# Patient Record
Sex: Female | Born: 1965 | Race: Black or African American | Hispanic: No | Marital: Single | Smoking: Never smoker
Health system: Southern US, Community
[De-identification: ages and names within clinical notes are randomized; demographics above are authoritative.]

## PROBLEM LIST (undated history)

## (undated) DIAGNOSIS — D649 Anemia, unspecified: Secondary | ICD-10-CM

## (undated) DIAGNOSIS — L509 Urticaria, unspecified: Secondary | ICD-10-CM

## (undated) DIAGNOSIS — K219 Gastro-esophageal reflux disease without esophagitis: Secondary | ICD-10-CM

## (undated) DIAGNOSIS — E162 Hypoglycemia, unspecified: Secondary | ICD-10-CM

## (undated) DIAGNOSIS — I209 Angina pectoris, unspecified: Secondary | ICD-10-CM

## (undated) DIAGNOSIS — M722 Plantar fascial fibromatosis: Secondary | ICD-10-CM

## (undated) DIAGNOSIS — Z9109 Other allergy status, other than to drugs and biological substances: Secondary | ICD-10-CM

## (undated) DIAGNOSIS — T783XXA Angioneurotic edema, initial encounter: Secondary | ICD-10-CM

## (undated) DIAGNOSIS — E7800 Pure hypercholesterolemia, unspecified: Secondary | ICD-10-CM

## (undated) DIAGNOSIS — J329 Chronic sinusitis, unspecified: Secondary | ICD-10-CM

## (undated) DIAGNOSIS — L309 Dermatitis, unspecified: Secondary | ICD-10-CM

## (undated) DIAGNOSIS — J309 Allergic rhinitis, unspecified: Secondary | ICD-10-CM

## (undated) DIAGNOSIS — R011 Cardiac murmur, unspecified: Secondary | ICD-10-CM

## (undated) DIAGNOSIS — I10 Essential (primary) hypertension: Secondary | ICD-10-CM

## (undated) HISTORY — DX: Allergic rhinitis, unspecified: J30.9

## (undated) HISTORY — DX: Dermatitis, unspecified: L30.9

## (undated) HISTORY — PX: CHOLECYSTECTOMY: SHX55

## (undated) HISTORY — DX: Angioneurotic edema, initial encounter: T78.3XXA

## (undated) HISTORY — DX: Gastro-esophageal reflux disease without esophagitis: K21.9

## (undated) HISTORY — PX: FOOT SURGERY: SHX648

## (undated) HISTORY — DX: Hypoglycemia, unspecified: E16.2

## (undated) HISTORY — DX: Urticaria, unspecified: L50.9

---

## 1999-04-15 ENCOUNTER — Emergency Department (HOSPITAL_COMMUNITY): Admission: EM | Admit: 1999-04-15 | Discharge: 1999-04-15 | Payer: Self-pay | Admitting: Emergency Medicine

## 1999-09-22 ENCOUNTER — Encounter: Admission: RE | Admit: 1999-09-22 | Discharge: 1999-09-22 | Payer: Self-pay | Admitting: Urology

## 1999-09-22 ENCOUNTER — Encounter: Payer: Self-pay | Admitting: Urology

## 2000-11-03 ENCOUNTER — Emergency Department (HOSPITAL_COMMUNITY): Admission: EM | Admit: 2000-11-03 | Discharge: 2000-11-03 | Payer: Self-pay | Admitting: Emergency Medicine

## 2000-12-03 ENCOUNTER — Other Ambulatory Visit: Admission: RE | Admit: 2000-12-03 | Discharge: 2000-12-03 | Payer: Self-pay | Admitting: *Deleted

## 2003-04-09 ENCOUNTER — Ambulatory Visit (HOSPITAL_COMMUNITY): Admission: RE | Admit: 2003-04-09 | Discharge: 2003-04-09 | Payer: Self-pay | Admitting: Family Medicine

## 2003-08-26 ENCOUNTER — Emergency Department (HOSPITAL_COMMUNITY): Admission: EM | Admit: 2003-08-26 | Discharge: 2003-08-27 | Payer: Self-pay | Admitting: Emergency Medicine

## 2003-12-30 ENCOUNTER — Ambulatory Visit (HOSPITAL_COMMUNITY): Admission: RE | Admit: 2003-12-30 | Discharge: 2003-12-30 | Payer: Self-pay | Admitting: *Deleted

## 2005-04-19 ENCOUNTER — Other Ambulatory Visit: Admission: RE | Admit: 2005-04-19 | Discharge: 2005-04-19 | Payer: Self-pay | Admitting: Family Medicine

## 2005-10-23 ENCOUNTER — Ambulatory Visit: Payer: Self-pay

## 2006-02-28 ENCOUNTER — Ambulatory Visit: Payer: Self-pay | Admitting: Obstetrics and Gynecology

## 2006-08-23 ENCOUNTER — Other Ambulatory Visit: Admission: RE | Admit: 2006-08-23 | Discharge: 2006-08-23 | Payer: Self-pay | Admitting: Family Medicine

## 2007-08-25 ENCOUNTER — Other Ambulatory Visit: Admission: RE | Admit: 2007-08-25 | Discharge: 2007-08-25 | Payer: Self-pay | Admitting: Family Medicine

## 2008-02-22 ENCOUNTER — Emergency Department (HOSPITAL_COMMUNITY): Admission: EM | Admit: 2008-02-22 | Discharge: 2008-02-22 | Payer: Self-pay | Admitting: Emergency Medicine

## 2008-09-07 ENCOUNTER — Other Ambulatory Visit: Admission: RE | Admit: 2008-09-07 | Discharge: 2008-09-07 | Payer: Self-pay | Admitting: Family Medicine

## 2009-07-19 ENCOUNTER — Ambulatory Visit: Payer: Self-pay | Admitting: General Practice

## 2009-08-04 ENCOUNTER — Ambulatory Visit (HOSPITAL_COMMUNITY): Admission: RE | Admit: 2009-08-04 | Discharge: 2009-08-04 | Payer: Self-pay | Admitting: Surgery

## 2009-09-20 ENCOUNTER — Other Ambulatory Visit: Admission: RE | Admit: 2009-09-20 | Discharge: 2009-09-20 | Payer: Self-pay | Admitting: Family Medicine

## 2009-10-19 ENCOUNTER — Ambulatory Visit: Payer: Self-pay

## 2009-10-20 ENCOUNTER — Ambulatory Visit: Payer: Self-pay

## 2009-12-09 ENCOUNTER — Ambulatory Visit: Payer: Self-pay | Admitting: Otolaryngology

## 2010-05-26 ENCOUNTER — Other Ambulatory Visit: Payer: Self-pay | Admitting: Emergency Medicine

## 2010-06-05 LAB — CBC
Hemoglobin: 12.5 g/dL (ref 12.0–15.0)
MCHC: 33.5 g/dL (ref 30.0–36.0)
RBC: 4.05 MIL/uL (ref 3.87–5.11)
WBC: 5.8 10*3/uL (ref 4.0–10.5)

## 2010-06-05 LAB — PREGNANCY, URINE: Preg Test, Ur: NEGATIVE

## 2010-06-14 ENCOUNTER — Other Ambulatory Visit: Payer: Self-pay | Admitting: General Practice

## 2010-08-04 NOTE — Group Therapy Note (Signed)
NAMEEMELYN, ROEN NO.:  [id number]   MEDICAL RECORD NO.:  [id number]          PATIENT TYPE:  WOC   LOCATION:  [facility]                   FACILITY:  [facility]   PHYSICIAN:  Ginger Carne, MD DATE OF BIRTH:  1965/07/31   DATE OF SERVICE:                                  CLINIC NOTE   Ms. Dai returns today with a misunderstanding based on her previous  Pap smears.  She has had an ascus Pap smear with atypical glandular  cells noted November 30, 2004.  She had a followup Pap smear on January 11, 2006, which is present in the chart and reported to be normal.  The  report that came from a consultation by Dr. __________ for the September  2006 atypical Pap smear was returned on February 04, 2006 by request  from Dr. Elsie Lincoln.  However, in the end, the Pap smear from October  of 2007 is normal.  The patient has normal periods and they are not  heavy.  They are regular, and at this time, there is no need for an  endometrial biopsy.  Recommendation is to have a followup Pap smear 1  year from October 2007.  The patient was surprised of said findings and  is happy with these results.           ______________________________  Ginger Carne, MD     SHB/MEDQ  D:  02/28/2006  T:  02/28/2006  Job:  [id number]

## 2010-09-25 ENCOUNTER — Other Ambulatory Visit: Payer: Self-pay | Admitting: Family Medicine

## 2010-09-25 ENCOUNTER — Other Ambulatory Visit (HOSPITAL_COMMUNITY)
Admission: RE | Admit: 2010-09-25 | Discharge: 2010-09-25 | Disposition: A | Discharge status: Home / Self Care | Payer: PRIVATE HEALTH INSURANCE | Source: Ambulatory Visit | Attending: Family Medicine | Admitting: Family Medicine

## 2010-09-25 DIAGNOSIS — Z01419 Encounter for gynecological examination (general) (routine) without abnormal findings: Secondary | ICD-10-CM | POA: Insufficient documentation

## 2010-12-22 LAB — URINE MICROSCOPIC-ADD ON

## 2010-12-22 LAB — URINALYSIS, ROUTINE W REFLEX MICROSCOPIC
Glucose, UA: NEGATIVE mg/dL
Leukocytes, UA: NEGATIVE
Specific Gravity, Urine: 1.011 (ref 1.005–1.030)
pH: 7 (ref 5.0–8.0)

## 2010-12-22 LAB — POCT PREGNANCY, URINE: Preg Test, Ur: NEGATIVE

## 2010-12-22 LAB — URINE CULTURE

## 2010-12-27 ENCOUNTER — Ambulatory Visit: Payer: Self-pay

## 2011-02-11 ENCOUNTER — Encounter: Payer: Self-pay | Admitting: *Deleted

## 2011-02-11 ENCOUNTER — Emergency Department (HOSPITAL_COMMUNITY)
Admission: EM | Admit: 2011-02-11 | Discharge: 2011-02-11 | Disposition: A | Discharge status: Home / Self Care | Payer: PRIVATE HEALTH INSURANCE | Source: Home / Self Care | Attending: Emergency Medicine | Admitting: Emergency Medicine

## 2011-02-11 DIAGNOSIS — J329 Chronic sinusitis, unspecified: Secondary | ICD-10-CM

## 2011-02-11 DIAGNOSIS — J310 Chronic rhinitis: Secondary | ICD-10-CM

## 2011-02-11 HISTORY — DX: Pure hypercholesterolemia, unspecified: E78.00

## 2011-02-11 HISTORY — DX: Other allergy status, other than to drugs and biological substances: Z91.09

## 2011-02-11 MED ORDER — FLUTICASONE PROPIONATE 50 MCG/ACT NA SUSP: 2.0000 | Freq: Every day | NASAL | Status: DC

## 2011-02-11 MED ORDER — DOXYCYCLINE HYCLATE 100 MG PO CAPS: 100.0000 mg | ORAL_CAPSULE | Freq: Two times a day (BID) | ORAL | Status: AC

## 2011-02-11 NOTE — ED Provider Notes (Addendum)
History     CSN: [id number] Arrival date & time: 02/11/2011  9:12 AM   First MD Initiated Contact with Patient 02/11/11 [phone]      Chief Complaint  Patient presents with  . Nasal Congestion    pt with c/o sinus congestion x 5 days increasingly worse - small amount of nose bleed intermittent - mild cough    (Consider location/radiation/quality/duration/timing/severity/associated sxs/prior treatment) HPI Comments: SINUS CONGESTION FOR 5 DAYS AND DRIPPING AT NIGHT "RAN OUT OF NASO NEX" USING SALINE SPRAY , DOCTOR TREATED ME WITH Z-PACK BUT DIDN'T WORK AND NOW RAN OUT OF FLONASE"  Patient is a 45 y.o. female presenting with sinusitis. The history is provided by the patient.  Sinusitis  This is a recurrent problem. The current episode started more than 2 days ago (5 DAYS). The problem has not changed since onset.There has been no fever. The pain is at a severity of 2/10. The pain has been constant since onset. Associated symptoms include congestion, sinus pressure and cough. Pertinent negatives include no ear pain and no shortness of breath. She has tried nothing for the symptoms.    Past Medical History  Diagnosis Date  . Environmental allergies   . High cholesterol     Past Surgical History  Procedure Date  . Cholecystectomy     History reviewed. No pertinent family history.  History  Substance Use Topics  . Smoking status: Never Smoker   . Smokeless tobacco: Not on file  . Alcohol Use: No    OB History    Grav Para Term Preterm Abortions TAB SAB Ect Mult Living                  Review of Systems  Constitutional: Negative for fever and fatigue.  HENT: Positive for congestion, rhinorrhea, sneezing, postnasal drip and sinus pressure. Negative for ear pain and ear discharge.   Respiratory: Positive for cough. Negative for shortness of breath and wheezing.     Allergies  Eggs or egg-derived products and Penicillins  Home Medications   Current Outpatient Rx  Name  Route Sig Dispense Refill  . FEXOFENADINE HCL 180 MG PO TABS Oral Take 180 mg by mouth daily.      . GUAIFENESIN 600 MG PO TB12 Oral Take 1,200 mg by mouth 2 (two) times daily.      Marland Kitchen NORGESTIMATE-ETH ESTRADIOL 0.25-35 MG-MCG PO TABS Oral Take 1 tablet by mouth daily.      Marland Kitchen OVER THE COUNTER MEDICATION  Cod liver oil     . PRAVASTATIN SODIUM 40 MG PO TABS Oral Take 40 mg by mouth daily.      Marland Kitchen VITAMIN B-12 100 MCG PO TABS Oral Take 50 mcg by mouth daily.      Marland Kitchen ZINC GLUCONATE 50 MG PO TABS Oral Take 50 mg by mouth daily.        BP 150/90  Pulse 71  Temp(Src) 97.8 F (36.6 C) (Oral)  Resp 16  SpO2 99%  LMP 02/08/2011  Physical Exam  Nursing note and vitals reviewed. Constitutional: She appears well-developed and well-nourished. No distress.  HENT:  Head: Normocephalic.  Right Ear: Tympanic membrane normal.  Left Ear: Hearing and tympanic membrane normal.  Nose: Mucosal edema and rhinorrhea present. Right sinus exhibits maxillary sinus tenderness. Left sinus exhibits maxillary sinus tenderness.  Mouth/Throat: Uvula is midline, oropharynx is clear and moist and mucous membranes are normal.  Eyes: Pupils are equal, round, and reactive to light.  Neck: Normal range of  motion.  Pulmonary/Chest: Effort normal and breath sounds normal. No respiratory distress. She has no wheezes.  Lymphadenopathy:    She has no cervical adenopathy.  Neurological: She is alert.    ED Course  Procedures (including critical care time)  Labs Reviewed - No data to display No results found.   No diagnosis found.    MDM  RHINITIS- WITH MAXILLARY SINUS INVOLVEMENT X 5 DAYS RECURRENT        Jimmie Molly, MD 02/11/11 1610  Jimmie Molly, MD 02/11/11 [id number]  Jimmie Molly, MD 02/11/11 [phone]

## 2011-07-11 ENCOUNTER — Ambulatory Visit: Payer: Self-pay | Admitting: Physician Assistant

## 2011-07-12 ENCOUNTER — Ambulatory Visit: Payer: Self-pay | Admitting: General Practice

## 2012-01-09 ENCOUNTER — Ambulatory Visit: Payer: Self-pay

## 2013-01-07 ENCOUNTER — Ambulatory Visit: Payer: Self-pay

## 2013-01-27 ENCOUNTER — Ambulatory Visit: Payer: Self-pay | Admitting: Family Medicine

## 2013-10-12 ENCOUNTER — Other Ambulatory Visit: Payer: Self-pay | Admitting: Family Medicine

## 2013-10-12 ENCOUNTER — Other Ambulatory Visit (HOSPITAL_COMMUNITY)
Admission: RE | Admit: 2013-10-12 | Discharge: 2013-10-12 | Disposition: A | Discharge status: Home / Self Care | Payer: 59 | Source: Ambulatory Visit | Attending: Family Medicine | Admitting: Family Medicine

## 2013-10-12 DIAGNOSIS — Z0000 Encounter for general adult medical examination without abnormal findings: Secondary | ICD-10-CM | POA: Insufficient documentation

## 2013-10-13 ENCOUNTER — Other Ambulatory Visit (HOSPITAL_COMMUNITY): Payer: Self-pay | Admitting: Family Medicine

## 2013-10-13 DIAGNOSIS — D259 Leiomyoma of uterus, unspecified: Secondary | ICD-10-CM

## 2013-10-14 LAB — CYTOLOGY - PAP

## 2013-10-19 ENCOUNTER — Ambulatory Visit (HOSPITAL_COMMUNITY)
Admission: RE | Admit: 2013-10-19 | Discharge: 2013-10-19 | Disposition: A | Discharge status: Home / Self Care | Payer: 59 | Source: Ambulatory Visit | Attending: Family Medicine | Admitting: Family Medicine

## 2013-10-19 ENCOUNTER — Other Ambulatory Visit (HOSPITAL_COMMUNITY): Payer: Self-pay | Admitting: Family Medicine

## 2013-10-19 DIAGNOSIS — D259 Leiomyoma of uterus, unspecified: Secondary | ICD-10-CM | POA: Insufficient documentation

## 2014-04-13 ENCOUNTER — Ambulatory Visit: Payer: Self-pay | Admitting: Family Medicine

## 2014-05-03 ENCOUNTER — Observation Stay: Payer: Self-pay | Admitting: Internal Medicine

## 2014-07-07 ENCOUNTER — Ambulatory Visit (INDEPENDENT_AMBULATORY_CARE_PROVIDER_SITE_OTHER): Payer: 59 | Admitting: Cardiology

## 2014-07-07 ENCOUNTER — Encounter: Payer: Self-pay | Admitting: Cardiology

## 2014-07-07 VITALS — BP 170/80 | HR 80 | Ht 61.0 in | Wt 157.4 lb

## 2014-07-07 DIAGNOSIS — R55 Syncope and collapse: Secondary | ICD-10-CM

## 2014-07-07 DIAGNOSIS — E785 Hyperlipidemia, unspecified: Secondary | ICD-10-CM | POA: Diagnosis not present

## 2014-07-07 DIAGNOSIS — I10 Essential (primary) hypertension: Secondary | ICD-10-CM | POA: Diagnosis not present

## 2014-07-07 DIAGNOSIS — K219 Gastro-esophageal reflux disease without esophagitis: Secondary | ICD-10-CM

## 2014-07-07 HISTORY — DX: Syncope and collapse: R55

## 2014-07-07 HISTORY — DX: Essential (primary) hypertension: I10

## 2014-07-07 HISTORY — DX: Hyperlipidemia, unspecified: E78.5

## 2014-07-07 NOTE — Progress Notes (Signed)
Cardiology Office Note   Date:  07/07/2014   ID:  Heather Rios, DOB 04/07/1965, MRN [id number]  PCP:  Vena Austria, MD  Cardiologist:   Candee Furbish, MD       History of Present Illness: Heather Rios is a 49 y.o. female who presents for  Evaluation of valve dysfunction. Passed out 05/03/14 at work in [place].  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 Her hands began to spasm bilaterally. She went out into the hallway in the next thing she knew she was sitting on the ground with nursing staff checking her blood pressure and blood sugar. When in the emergency room there , her orthostatics were normal. Her spasm resolved. Her potassium was found to be low and she was repleted. For losartan hydrochlorothiazide was discontinued.  HR was "off". ECHO was OK per patient. Stress test OK, Sleep Study OK.   Hydaralzine was used. Losartan/ HCT taken off. Dr. Humphrey Rolls in [place]  Evaluated her. She was concerned about potential "leaky valve" which was supposed to be followed up with 6 month follow-up.  No SCD in family. Mother has CHF 2012.  This is her first experience with syncope she states. She has had some anxiety as well as some episodes of hypoglycemia in the past. She is not had any further episodes.    Past Medical History  Diagnosis Date  . Environmental allergies   . High cholesterol   . GERD (gastroesophageal reflux disease)   . Allergic rhinitis   . Hypoglycemia   . Eczema     Past Surgical History  Procedure Laterality Date  . Cholecystectomy       Current Outpatient Prescriptions  Medication Sig Dispense Refill  . azelastine (ASTELIN) 0.1 % nasal spray Place 1 spray into both nostrils 2 (two) times daily. Use in each nostril as directed    . cetirizine (ZYRTEC) 10 MG tablet Take 10 mg by mouth at bedtime.    . ferrous fumarate  (HEMOCYTE - 106 MG FE) 325 (106 FE) MG TABS tablet Take 1 tablet by mouth.    Marland Kitchen guaiFENesin (MUCINEX) 600 MG 12 hr tablet Take 1,200 mg by mouth 2 (two) times daily.      . hydrALAZINE (APRESOLINE) 25 MG tablet Take 25 mg by mouth 2 (two) times daily.    Marland Kitchen loratadine (CLARITIN) 10 MG tablet Take 10 mg by mouth daily.    . montelukast (SINGULAIR) 10 MG tablet Take 10 mg by mouth at bedtime.    . norgestimate-ethinyl estradiol (ORTHO-CYCLEN,SPRINTEC,PREVIFEM) 0.25-35 MG-MCG tablet Take 1 tablet by mouth daily.      Marland Kitchen OVER THE COUNTER MEDICATION Cod liver oil     . pravastatin (PRAVACHOL) 40 MG tablet Take 40 mg by mouth daily.      . ranitidine (ZANTAC) 300 MG tablet Take 300 mg by mouth at bedtime.    . triamcinolone cream (KENALOG) 0.1 % Apply 1 application topically 2 (two) times daily.    . vitamin B-12 (CYANOCOBALAMIN) 100 MCG tablet Take 50 mcg by mouth daily.      Marland Kitchen zinc gluconate 50 MG tablet Take 50 mg by mouth 2 (two) times daily.      No current facility-administered medications for this visit.    Allergies:   Adhesive; Augmentin; Contrast media; Sudafed; Ciprofloxacin; Eggs or egg-derived products; Hydrocodone bitartrate er;  Nexium; and Penicillins    Social History:  The patient  reports that she has never smoked. She does not have any smokeless tobacco history on file. She reports that she does not drink alcohol or use illicit drugs.   Family History:  The patient's family history includes Heart failure in her mother.    ROS:  Please see the history of present illness.   Otherwise, review of systems are positive for none.   All other systems are reviewed and negative.    PHYSICAL EXAM: VS:  BP 170/80 mmHg  Pulse 80  Ht 5\' 1"  (1.549 m)  Wt 157 lb 6.4 oz (71.396 kg)  BMI 29.76 kg/m2  LMP 06/23/2014 , BMI Body mass index is 29.76 kg/(m^2). GEN: Well nourished, well developed, in no acute distress HEENT: normal Neck: no JVD, carotid bruits, or masses Cardiac: RRR; soft  systolic LUSB murmur, no rubs, or gallops,no edema  Respiratory:  clear to auscultation bilaterally, normal work of breathing GI: soft, nontender, nondistended, + BS MS: no deformity or atrophy Skin: warm and dry, no rash Neuro:  Strength and sensation are intact Psych: euthymic mood, full affect,  anxious   EKG:  EKG is ordered today. The ekg ordered today demonstrates  07/07/14-normal rhythm, 80, nonspecific T-wave changes   Recent Labs: No results found for requested labs within last 365 days.    Lipid Panel No results found for: CHOL, TRIG, HDL, CHOLHDL, VLDL, LDLCALC, LDLDIRECT    Wt Readings from Last 3 Encounters:  07/07/14 157 lb 6.4 oz (71.396 kg)      Other studies Reviewed: Additional studies/ records that were reviewed today include: Dr. Alyson Ingles records.  Review of the above records demonstrates: as above   ASSESSMENT AND PLAN:  1.  Syncope-extensive evaluation performed by Dr. Chancy Milroy. This included echocardiogram, stress test, sleep study. Potassium , hypokalemia noted. hydrochlorthiazide was discontinued. Hydralazine started. Blood pressure is elevated today , continue to monitor. At work it has been normal recently. She does have a degree of whitecoat hypertension. Her symptoms could have been vagally mediated. No ventricular tachycardia noted in hospital monitoring.   2. 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 She is unsure which valve it is or the severity. She was told that this would be followed up in 6 month. I do hear a soft systolic murmur at left lower sternal border. Question if this is tricuspid regurgitation. We will obtain echocardiogram record from Dr. Humphrey Rolls.   3. Essential hypertension-continue to worked with Dr. Alyson Ingles.  Avoiding HCTZ because of hypokalemia. Could reinitiate losartan.  4.  Hyperlipidemia"pravastatin.   5. GERD -ranitidine   Current medicines are reviewed at length with the patient today.  The patient does not  have concerns regarding medicines.  The following changes have been made:  no change  Labs/ tests ordered today include: none.  But we're obtaining records. No orders of the defined types were placed in this encounter.     Disposition:   FU with Anelle Parlow in 6 months  Signed, Candee Furbish, MD  07/07/2014 3:09 PM    [facility] [place], [place], [place]  [address] Phone: [phone]; Fax: [phone]

## 2014-07-07 NOTE — Patient Instructions (Signed)
Medication Instructions:  Your physician recommends that you continue on your current medications as directed. Please refer to the Current Medication list given to you today.  Labwork: None  Testing/Procedures: None  Follow-Up: Follow up in 6 months with Dr. Marlou Porch.  You will receive a letter in the mail 2 months before you are due.  Please call us when you receive this letter to schedule your follow up appointment.  Thank you for choosing [facility]!!

## 2014-07-18 NOTE — H&P (Signed)
PATIENT NAME:  Heather Rios, ENCARNACION MR#:  [id number] DATE OF BIRTH:  04-04-1965  DATE OF ADMISSION:  05/03/2014  PRIMARY CARE PHYSICIAN: Located in [place].  CHIEF COMPLAINT: Presyncope.   HISTORY OF PRESENT ILLNESS: This is a 49 year old female who presents to the hospital from her work due to a presyncopal/syncopal episode. The patient says that she has been feeling kind of lightheaded and dizzy for the past couple of days. She works as a Community education officer at [employer]. She was working with a patient when she felt kind of lightheaded and dizzy and sat down. She tried to stand up again and then fell to the ground, but did not completely lose consciousness. She denied any chest pain, palpitations, nausea, vomiting, or any other associated symptoms with her syncope. She did say that her arms got kind of rigid and her face felt a little bit numb during her episode which has now resolved. She was brought to the ER for further evaluation. In the Emergency Room, the patient was noted to be hypokalemic with a potassium of 2.9, also noted to have an abnormal EKG with prolonged QT. She was seen by cardiology, by Dr. Humphrey Rolls, who recommended admission and replacement of her electrolytes.   REVIEW OF SYSTEMS: CONSTITUTIONAL: No documented fever. No weight gain. No weight loss.  EYES: No blurred or double vision.  ENT: No tinnitus. No postnasal drip. No redness of the oropharynx.  RESPIRATORY: No cough, no wheeze, no hemoptysis, no dyspnea.  CARDIOVASCULAR: No chest pain, no orthopnea, no palpitations. Positive syncope.  GASTROINTESTINAL: No nausea, no vomiting, no diarrhea, no abdominal pain, no melena or hematochezia.  GENITOURINARY: No dysuria or hematuria.  ENDOCRINE: No polyuria or nocturia. No heat or cold intolerance. HEME AND LYMPH: No anemia. No bruising. No bleeding.  INTEGUMENTARY: No rashes. No lesions.  MUSCULOSKELETAL: No arthritis, no swelling, no gout.  NEUROLOGIC: No  numbness. No tingling. No ataxia. No seizure type activity.  PSYCHIATRIC: No anxiety. No insomnia. No ADD.   PAST MEDICAL HISTORY: Consistent with hypertension, hyperlipidemia, GERD.  ALLERGIES: PENICILLIN AND EGGS.   SOCIAL HISTORY: No smoking. No alcohol abuse. No illicit drug abuse. Lives with her husband.   FAMILY HISTORY: Mother and father are both alive. Diabetes runs in both mother and father's side of the family. Mother also has heart disease.   CURRENT MEDICATIONS: Hydrochlorothiazide/losartan 12.5/100 one tablet daily, omeprazole 40 mg daily, Pravachol 40 mg daily and Zantac 150 mg at bedtime.   PHYSICAL EXAMINATION: Presently is as follows:  VITAL SIGNS: Temperature is 98.2, pulse 85, respirations 18, blood pressure 191/92 and sats 99% on room air.  GENERAL: She is a pleasant-appearing female, in no apparent distress.  HEAD, EYES, EARS, NOSE AND THROAT: Atraumatic, normocephalic. Extraocular muscles are intact. Pupils are equal and reactive to light. Sclerae anicteric. No conjunctival injection. No pharyngeal erythema.  NECK: Supple. No jugular venous distention. No bruits, no lymphadenopathy, no thyromegaly.  HEART: Regular rate and rhythm. No murmurs, no rubs, no clicks.  LUNGS: Clear to auscultation bilaterally. No rales, no rhonchi, no wheezes.  ABDOMEN: Soft, flat, nontender, nondistended. Has good bowel sounds. No hepatosplenomegaly appreciated.  EXTREMITIES: No evidence of any cyanosis, clubbing, or peripheral edema. Has +2 pedal and radial pulses bilaterally.  NEUROLOGIC: The patient is alert, awake, and oriented x3 with no focal motor or sensory deficits appreciated bilaterally.  SKIN: Moist and warm with no rashes appreciated.  LYMPHATIC: There is no cervical or axillary lymphadenopathy.   DIAGNOSTIC DATA: Serum  glucose 99, BUN 14, creatinine 0.9, sodium 139, potassium 2.9, chloride 104, bicarb 26. LFTs are within normal limits. Troponin less than 0.02. White cell count  7.9, hemoglobin 12, hematocrit 37.4 and platelet count 315,000.   The patient did have an EKG done which shows normal sinus rhythm with normal axes, T wave inversions in the lateral leads and also a prolonged QT.   ASSESSMENT AND PLAN: This is a 49 year old female with a history of hypertension, gastroesophageal reflux disease and hyperlipidemia who presents to the hospital with a presyncopal episode at work today.  1.  Syncope/presyncope. The exact etiology of this is unclear. Unlikely this is neurogenic in nature as she has a nonfocal neurological exam and no acute neurological symptoms presently. The patient's EKG did show a prolonged QT and QTc of which could be related to the electrolyte abnormalities. For now will observe her on telemetry, follow serial cardiac markers, get a 2-dimensional echocardiogram. The patient was seen by cardiology, Dr. Humphrey Rolls. He recommended replacing electrolytes and repeating her EKG in the morning. I will also go ahead and check orthostatic vital signs. 2.  Hypertension. I will hold her HCTZ/losartan for now until we get orthostatic vital signs back. Otherwise, I will resume her blood pressure medications.  3.  Abnormal EKG. The patient does have a prolonged QT. Her cardiac markers x2 have been negative. This is likely related to the hypokalemia. I will go ahead and replace her potassium accordingly, repeat her EKG in the morning. Her magnesium level was normal.  4.  Gastroesophageal reflux disease. Continue Zantac and Protonix. 5.  Hyperlipidemia. Continue Pravachol.   CODE STATUS: FULL.  TIME SPENT: 45 minutes.  ____________________________ Belia Heman. Verdell Carmine, MD vjs:sb D: 05/03/2014 14:37:10 ET T: 05/03/2014 14:55:14 ET JOB#: [id number]  cc: Belia Heman. Verdell Carmine, MD, <Dictator> Henreitta Leber MD ELECTRONICALLY SIGNED 05/19/2014 15:28

## 2014-07-18 NOTE — Consult Note (Signed)
PATIENT NAME:  Heather Rios, Heather Rios MR#:  [id number] DATE OF BIRTH:  December 01, 1965  DATE OF CONSULTATION:  05/03/2014  REFERRING PHYSICIAN:   CONSULTING PHYSICIAN:  Dionisio David, MD  INDICATION FOR CONSULTATION: Syncopal episode.   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 She was brought to the Emergency Room where she was found to have prolonged QT interval, thus I was asked to evaluate the patient. She denies any prior chest pain or shortness of breath, PND, orthopnea, or leg swelling.   PAST MEDICAL HISTORY: History of hypertension, hyperlipidemia. No history of coronary artery disease.   SOCIAL HISTORY: No EtOH abuse or smoking.   FAMILY HISTORY: Positive for myocardial infarction, mother had below age 72 several myocardial infarctions, had cardiomyopathy with defibrillator. Uncle died suddenly of also myocardial infarction.   PHYSICAL EXAMINATION:  GENERAL: She is alert, oriented x 3, in no acute distress right now.  VITAL SIGNS: Stable.  NECK: No JVD.  LUNGS: Clear.  HEART: Regular rate and rhythm. Normal S1, S2. No audible murmur.  ABDOMEN: Soft, nontender, positive bowel sounds.  EXTREMITIES: No pedal edema.  NEUROLOGIC: She appears to be intact.   ELECTROCARDIOGRAM:  Shows normal sinus rhythm, 80 beats per minute, LVH with nonspecific ST-T changes, QTc initially was 505 and now is 563.   LABORATORY DATA: Potassium of 2.9. First troponin is negative. BUN and creatinine are normal.   ASSESSMENT AND PLAN: Syncopal episodes most likely secondary to possibly dehydration, but also can be due to torsade de pointes, polymorphic ventricular tachycardia. Advised replacing the potassium to 4, giving 40 mEq p.o. now  potassium and then 40 mEq over 4 hours, advise getting the potassium to be about 4 and also give 2 grams of magnesium. We will check EKG in the morning along with potassium and magnesium.   Thank you very much for the referral.   Also get an echocardiogram.      ____________________________ Dionisio David, MD sak:bu D: 05/03/2014 13:46:00 ET T: 05/03/2014 13:52:53 ET JOB#: [id number]  cc: Dionisio David, MD, <Dictator> Dionisio David MD ELECTRONICALLY SIGNED 05/20/2014 12:15

## 2014-07-18 NOTE — Discharge Summary (Signed)
PATIENT NAME:  Heather Rios, Heather Rios MR#:  [id number] DATE OF BIRTH:  Oct 21, 1965  DATE OF ADMISSION:  05/03/2014 DATE OF DISCHARGE:  05/04/2014  PRIMARY CARE PHYSICIAN: Nonlocal.  DISCHARGE DIAGNOSES: 1.  Syncope or presyncope. 2.  Prolonged QT. 3.  Hypokalemia.  4.  Hypertension.  5.  Gastroesophageal reflux disease.  6.  Hyperlipidemia.   CONDITION: Stable.   CODE STATUS: Full code.   HOME MEDICATIONS: Please refer to the medication reconciliation list.  The patient's hydrochlorothiazide/losartan was discontinued. New medications: Lopressor 50 mg p.o. once a day. Hydralazine 25 mg p.o. q. 12 hours.   DIET: Low-sodium, low-fat, and low-cholesterol diet.   ACTIVITY: As tolerated.   FOLLOWUP CARE: Follow up with PCP and Dr. Humphrey Rolls within 1-2 weeks.   REASON FOR ADMISSION: Presyncope and syncope episodes.   HOSPITAL COURSE: The patient is a 49 year old African American female with a history of hypertension,  presented to the ED from work due to lightheadedness and dizziness in a couple of days The patient was noted to have low potassium at 2.9. EKG showed prolonged QT. For detailed history and physical examination, please refer to the admission note dictated by Dr. Verdell Carmine. After admission, Dr. Humphrey Rolls suggested prolonged QT is possibly due to hypokalemia. He suggested that the patient's syncope episode is possibly due to a prolonged QT caused by hypokalemia. After potassium supplement, hypokalemia improved. Echocardiograph showed normal ejection fraction at 65% to 70%. Dr. Humphrey Rolls suggested the patient can be discharged and follow up with him as an outpatient. The patient has no complaints. Vital signs are stable. Physical examination is unremarkable. The patient is clinically stable, will be discharged to home today. As mentioned above, the patient's hydrochlorothiazide/losartan is discontinued, and she will be given Lopressor and hydralazine prescription. I discussed the patient's discharge plan  with the patient, nurse and the case manager, and Dr. Humphrey Rolls.   TIME SPENT: About 38 minutes.    ____________________________ Demetrios Loll, MD qc:TM D: 05/04/2014 16:54:28 ET T: 05/04/2014 23:57:23 ET JOB#: [id number]  cc: Demetrios Loll, MD, <Dictator> Demetrios Loll MD ELECTRONICALLY SIGNED 05/05/2014 17:46

## 2014-09-06 ENCOUNTER — Ambulatory Visit (INDEPENDENT_AMBULATORY_CARE_PROVIDER_SITE_OTHER): Payer: 59 | Admitting: Podiatry

## 2014-09-06 ENCOUNTER — Encounter: Payer: Self-pay | Admitting: Podiatry

## 2014-09-06 ENCOUNTER — Ambulatory Visit (INDEPENDENT_AMBULATORY_CARE_PROVIDER_SITE_OTHER): Payer: 59

## 2014-09-06 VITALS — BP 163/95 | HR 88 | Resp 16

## 2014-09-06 DIAGNOSIS — M722 Plantar fascial fibromatosis: Secondary | ICD-10-CM | POA: Diagnosis not present

## 2014-09-06 DIAGNOSIS — M779 Enthesopathy, unspecified: Secondary | ICD-10-CM | POA: Diagnosis not present

## 2014-09-06 MED ORDER — DICLOFENAC SODIUM 75 MG PO TBEC: 75.0000 mg | DELAYED_RELEASE_TABLET | Freq: Two times a day (BID) | ORAL | Status: DC

## 2014-09-06 MED ORDER — TRIAMCINOLONE ACETONIDE 10 MG/ML IJ SUSP
10.0000 mg | Freq: Once | INTRAMUSCULAR | Status: AC
  Administered 2014-09-06: 10 mg

## 2014-09-06 MED ORDER — MELOXICAM 15 MG PO TABS: 15.0000 mg | ORAL_TABLET | Freq: Every day | ORAL | Status: DC

## 2014-09-06 NOTE — Progress Notes (Signed)
   Subjective:    Patient ID: Heather Rios, female    DOB: 1965/05/23, 49 y.o.   MRN: [id number]  HPI Comments: "I have pain in the side of the foot"  Patient c/o aching lateral left foot for about 2 weeks. The area is swollen, more so dorsally. No injury. She saw PCP early June and she gave meloxicam and flexeril-no helped.  Foot Pain      Review of Systems  Musculoskeletal: Positive for gait problem.  All other systems reviewed and are negative.      Objective:   Physical Exam        Assessment & Plan:

## 2014-09-06 NOTE — Patient Instructions (Signed)

## 2014-09-07 NOTE — Progress Notes (Signed)
Subjective:     Patient ID: Heather Rios, female   DOB: Apr 25, 1965, 49 y.o.   MRN: [id number]  HPI patient states she's developed a lot of pain on the side of the left foot and that it no longer hurts as much in the heel as it does in this area   Review of Systems     Objective:   Physical Exam Neurovascular status intact muscle strength adequate range of motion within normal limits. Patient's found to have pain and inflammation in the left peroneal insertion with no indication of muscle trauma    Assessment:     Tendinitis left with inflammation at the insertion into the fifth metatarsal base    Plan:     Careful sheath injection administered 3 mg Kenalog 5 mg Xylocaine and instructed on reduced activity and reappoint as needed

## 2014-09-13 ENCOUNTER — Ambulatory Visit (INDEPENDENT_AMBULATORY_CARE_PROVIDER_SITE_OTHER): Payer: 59 | Admitting: Podiatry

## 2014-09-13 ENCOUNTER — Encounter: Payer: Self-pay | Admitting: Podiatry

## 2014-09-13 VITALS — BP 140/81 | HR 91 | Resp 15

## 2014-09-13 DIAGNOSIS — M779 Enthesopathy, unspecified: Secondary | ICD-10-CM | POA: Diagnosis not present

## 2014-09-15 NOTE — Progress Notes (Signed)
Subjective:     Patient ID: Heather Rios, female   DOB: 1965-04-15, 49 y.o.   MRN: [id number]  HPI patient states it's quite a bit improved with pain still noted upon excessive walking or if I on the outside of my foot   Review of Systems     Objective:   Physical Exam Neurovascular status intact muscle strength adequate range of motion within normal limits with discomfort in the lateral side of the peroneal tendon group but improved with no indication of muscle strength loss    Assessment:     Tendinitis that's improving quite a bit    Plan:     Advised on supportive shoe ice therapy and patient be seen back as needed. May require immobilization if symptoms persist

## 2014-09-28 ENCOUNTER — Other Ambulatory Visit: Payer: Self-pay | Admitting: Obstetrics and Gynecology

## 2014-11-14 ENCOUNTER — Emergency Department (HOSPITAL_BASED_OUTPATIENT_CLINIC_OR_DEPARTMENT_OTHER): Payer: 59

## 2014-11-14 ENCOUNTER — Encounter (HOSPITAL_BASED_OUTPATIENT_CLINIC_OR_DEPARTMENT_OTHER): Payer: Self-pay | Admitting: *Deleted

## 2014-11-14 ENCOUNTER — Emergency Department (HOSPITAL_BASED_OUTPATIENT_CLINIC_OR_DEPARTMENT_OTHER)
Admission: EM | Admit: 2014-11-14 | Discharge: 2014-11-14 | Disposition: A | Discharge status: Home / Self Care | Payer: 59 | Attending: Emergency Medicine | Admitting: Emergency Medicine

## 2014-11-14 DIAGNOSIS — Z79899 Other long term (current) drug therapy: Secondary | ICD-10-CM | POA: Insufficient documentation

## 2014-11-14 DIAGNOSIS — R0602 Shortness of breath: Secondary | ICD-10-CM | POA: Diagnosis not present

## 2014-11-14 DIAGNOSIS — Z880 Allergy status to penicillin: Secondary | ICD-10-CM | POA: Diagnosis not present

## 2014-11-14 DIAGNOSIS — R079 Chest pain, unspecified: Secondary | ICD-10-CM | POA: Insufficient documentation

## 2014-11-14 DIAGNOSIS — E780 Pure hypercholesterolemia: Secondary | ICD-10-CM | POA: Diagnosis not present

## 2014-11-14 DIAGNOSIS — I10 Essential (primary) hypertension: Secondary | ICD-10-CM | POA: Diagnosis not present

## 2014-11-14 DIAGNOSIS — Z872 Personal history of diseases of the skin and subcutaneous tissue: Secondary | ICD-10-CM | POA: Insufficient documentation

## 2014-11-14 DIAGNOSIS — Z7952 Long term (current) use of systemic steroids: Secondary | ICD-10-CM | POA: Insufficient documentation

## 2014-11-14 DIAGNOSIS — K219 Gastro-esophageal reflux disease without esophagitis: Secondary | ICD-10-CM | POA: Insufficient documentation

## 2014-11-14 HISTORY — DX: Essential (primary) hypertension: I10

## 2014-11-14 LAB — TROPONIN I: Troponin I: <0.03 ng/mL (ref <0.031)

## 2014-11-14 LAB — BASIC METABOLIC PANEL
Anion gap: 8 (ref 5–15)
BUN: 14 mg/dL (ref 6–20)
CALCIUM: 9.6 mg/dL (ref 8.9–10.3)
CO2: 24 mmol/L (ref 22–32)
CREATININE: 0.88 mg/dL (ref 0.44–1.00)
Chloride: 109 mmol/L (ref 101–111)
GFR calc Af Amer: >60 mL/min (ref >60)
GFR calc non Af Amer: >60 mL/min (ref >60)
GLUCOSE: 98 mg/dL (ref 65–99)
Potassium: 3.7 mmol/L (ref 3.5–5.1)
Sodium: 141 mmol/L (ref 135–145)

## 2014-11-14 LAB — CBC
HCT: 35.2 % — ABNORMAL LOW (ref 36.0–46.0)
HEMOGLOBIN: 11.3 g/dL — AB (ref 12.0–15.0)
MCH: 26.2 pg (ref 26.0–34.0)
MCHC: 32.1 g/dL (ref 30.0–36.0)
MCV: 81.5 fL (ref 78.0–100.0)
PLATELETS: 339 10*3/uL (ref 150–400)
RBC: 4.32 MIL/uL (ref 3.87–5.11)
RDW: 16.8 % — AB (ref 11.5–15.5)
WBC: 5.2 10*3/uL (ref 4.0–10.5)

## 2014-11-14 LAB — D-DIMER, QUANTITATIVE: D-Dimer, Quant: 0.38 ug/mL-FEU (ref 0.00–0.48)

## 2014-11-14 NOTE — Discharge Instructions (Signed)
1. Medications: usual home medications 2. Treatment: rest, drink plenty of fluids,  3. Follow Up: Please followup with Dr. Marlou Porch in 7 days for discussion of your diagnoses and further evaluation after today's visit; if you do not have a primary care doctor use the resource guide provided to find one; Please return to the ER for worsening symptoms or other concerns   Chest Pain (Nonspecific) It is often hard to give a specific diagnosis for the cause of chest pain. There is always a chance that your pain could be related to something serious, such as a heart attack or a blood clot in the lungs. You need to follow up with your health care provider for further evaluation. CAUSES   Heartburn.  Pneumonia or bronchitis.  Anxiety or stress.  Inflammation around your heart (pericarditis) or lung (pleuritis or pleurisy).  A blood clot in the lung.  A collapsed lung (pneumothorax). It can develop suddenly on its own (spontaneous pneumothorax) or from trauma to the chest.  Shingles infection (herpes zoster virus). The chest wall is composed of bones, muscles, and cartilage. Any of these can be the source of the pain.  The bones can be bruised by injury.  The muscles or cartilage can be strained by coughing or overwork.  The cartilage can be affected by inflammation and become sore (costochondritis). DIAGNOSIS  Lab tests or other studies may be needed to find the cause of your pain. Your health care provider may have you take a test called an ambulatory electrocardiogram (ECG). An ECG records your heartbeat patterns over a 24-hour period. You may also have other tests, such as:  Transthoracic echocardiogram (TTE). During echocardiography, sound waves are used to evaluate how blood flows through your heart.  Transesophageal echocardiogram (TEE).  Cardiac monitoring. This allows your health care provider to monitor your heart rate and rhythm in real time.  Holter monitor. This is a portable  device that records your heartbeat and can help diagnose heart arrhythmias. It allows your health care provider to track your heart activity for several days, if needed.  Stress tests by exercise or by giving medicine that makes the heart beat faster. TREATMENT   Treatment depends on what may be causing your chest pain. Treatment may include:  Acid blockers for heartburn.  Anti-inflammatory medicine.  Pain medicine for inflammatory conditions.  Antibiotics if an infection is present.  You may be advised to change lifestyle habits. This includes stopping smoking and avoiding alcohol, caffeine, and chocolate.  You may be advised to keep your head raised (elevated) when sleeping. This reduces the chance of acid going backward from your stomach into your esophagus. Most of the time, nonspecific chest pain will improve within 2-3 days with rest and mild pain medicine.  HOME CARE INSTRUCTIONS   If antibiotics were prescribed, take them as directed. Finish them even if you start to feel better.  For the next few days, avoid physical activities that bring on chest pain. Continue physical activities as directed.  Do not use any tobacco products, including cigarettes, chewing tobacco, or electronic cigarettes.  Avoid drinking alcohol.  Only take medicine as directed by your health care provider.  Follow your health care provider's suggestions for further testing if your chest pain does not go away.  Keep any follow-up appointments you made. If you do not go to an appointment, you could develop lasting (chronic) problems with pain. If there is any problem keeping an appointment, call to reschedule. SEEK MEDICAL CARE IF:  Your chest pain does not go away, even after treatment.  You have a rash with blisters on your chest.  You have a fever. SEEK IMMEDIATE MEDICAL CARE IF:   You have increased chest pain or pain that spreads to your arm, neck, jaw, back, or abdomen.  You have  shortness of breath.  You have an increasing cough, or you cough up blood.  You have severe back or abdominal pain.  You feel nauseous or vomit.  You have severe weakness.  You faint.  You have chills. This is an emergency. Do not wait to see if the pain will go away. Get medical help at once. Call your local emergency services (911 in [place] Do not drive yourself to the hospital. MAKE SURE YOU:   Understand these instructions.  Will watch your condition.  Will get help right away if you are not doing well or get worse. Document Released: 12/13/2004 Document Revised: 03/10/2013 Document Reviewed: 10/09/2007 [facility] Patient Information 2015 [place], [place]. This information is not intended to replace advice given to you by your health care provider. Make sure you discuss any questions you have with your health care provider.

## 2014-11-14 NOTE — ED Provider Notes (Signed)
CSN: [id number]     Arrival date & time 11/14/14  1312 History   First MD Initiated Contact with Patient 11/14/14 1410     Chief Complaint  Patient presents with  . Chest Pain     (Consider location/radiation/quality/duration/timing/severity/associated sxs/prior Treatment) The history is provided by the patient and medical records. No language interpreter was used.     ALISSON ROZELL is a 49 y.o. female  with a hx of allergies, GERD, high cholesterol, HTN presents to the Emergency Department complaining of dull chest pain that lasted 52minute onset 9am and it did not return.  Pt reports she has a hx of GERD and gas pain that causes chest pain.  Pt reports that this morning's episode was very different with associated SOB and lightheadedness.  Pt reports she was seen at the [facility] in Clinic who performed an ECG which she reports was different from previous.  No aggravating or alleviating factors.  Pt reports she feels totally normal now.  She also reports placement of Nexplanon on 8/18 placed at [facility].  Pt denies fever, chills, cough, chest congestion, headache, neck pain, back pain, abd pain, nausea, vomiting, diarrhea, syncope, dysuria.  Pt reports hx of cholecystectomy. She reports a "leaking heart valve" but she does not know which one and she "fired that cardiologist."     Denies recent periods of immobilization, leg swelling, surgeries or broken bones.  Pt reports she was taking OCP but stopped this in July   Past Medical History  Diagnosis Date  . Environmental allergies   . High cholesterol   . GERD (gastroesophageal reflux disease)   . Allergic rhinitis   . Hypoglycemia   . Eczema   . Hypertension    Past Surgical History  Procedure Laterality Date  . Cholecystectomy     Family History  Problem Relation Age of Onset  . Heart failure Mother    Social History  Substance Use Topics  . Smoking status: Never Smoker   . Smokeless tobacco: None  . Alcohol Use: No   OB  History    No data available     Review of Systems  Constitutional: Negative for fever, diaphoresis, appetite change, fatigue and unexpected weight change.  HENT: Negative for mouth sores.   Eyes: Negative for visual disturbance.  Respiratory: Positive for shortness of breath. Negative for cough, chest tightness and wheezing.   Cardiovascular: Positive for chest pain.  Gastrointestinal: Negative for nausea, vomiting, abdominal pain, diarrhea and constipation.  Endocrine: Negative for polydipsia, polyphagia and polyuria.  Genitourinary: Negative for dysuria, urgency, frequency and hematuria.  Musculoskeletal: Negative for back pain and neck stiffness.  Skin: Negative for rash.  Allergic/Immunologic: Negative for immunocompromised state.  Neurological: Positive for light-headedness. Negative for syncope, facial asymmetry and headaches.  Hematological: Does not bruise/bleed easily.  Psychiatric/Behavioral: Negative for sleep disturbance. The patient is not nervous/anxious.       Allergies  Adhesive; Augmentin; Contrast media; Provera; Sudafed; Ciprofloxacin; Eggs or egg-derived products; Hydrocodone bitartrate er; Nexium; and Penicillins  Home Medications   Prior to Admission medications   Medication Sig Start Date End Date Taking? Authorizing Provider  azelastine (ASTELIN) 0.1 % nasal spray Place 1 spray into both nostrils 2 (two) times daily. Use in each nostril as directed   Yes Historical Provider, MD  cetirizine (ZYRTEC) 10 MG tablet Take 10 mg by mouth at bedtime.   Yes Historical Provider, MD  ferrous fumarate (HEMOCYTE - 106 MG FE) 325 (106 FE) MG TABS tablet  Take 1 tablet by mouth.   Yes Historical Provider, MD  loratadine (CLARITIN) 10 MG tablet Take 10 mg by mouth daily.   Yes Historical Provider, MD  montelukast (SINGULAIR) 10 MG tablet Take 10 mg by mouth at bedtime.   Yes Historical Provider, MD  norgestimate-ethinyl estradiol (ORTHO-CYCLEN,SPRINTEC,PREVIFEM) 0.25-35  MG-MCG tablet Take 1 tablet by mouth daily.     Yes Historical Provider, MD  pantoprazole (PROTONIX) 40 MG tablet Take 40 mg by mouth daily.   Yes Historical Provider, MD  pravastatin (PRAVACHOL) 40 MG tablet Take 40 mg by mouth daily.     Yes Historical Provider, MD  ranitidine (ZANTAC) 300 MG tablet Take 300 mg by mouth at bedtime.   Yes Historical Provider, MD  triamcinolone cream (KENALOG) 0.1 % Apply 1 application topically 2 (two) times daily.   Yes Historical Provider, MD  valsartan (DIOVAN) 40 MG tablet Take 40 mg by mouth daily.   Yes Historical Provider, MD  vitamin B-12 (CYANOCOBALAMIN) 100 MCG tablet Take 50 mcg by mouth daily.     Yes Historical Provider, MD  zinc gluconate 50 MG tablet Take 50 mg by mouth 2 (two) times daily.    Yes Historical Provider, MD  diclofenac (VOLTAREN) 75 MG EC tablet Take 1 tablet (75 mg total) by mouth 2 (two) times daily. 09/06/14   Wallene Huh, DPM  guaiFENesin (MUCINEX) 600 MG 12 hr tablet Take 1,200 mg by mouth 2 (two) times daily.      Historical Provider, MD  hydrALAZINE (APRESOLINE) 25 MG tablet Take 25 mg by mouth 2 (two) times daily.    Historical Provider, MD  meloxicam (MOBIC) 15 MG tablet Take 1 tablet (15 mg total) by mouth daily. 09/06/14   Wallene Huh, DPM  OVER THE COUNTER MEDICATION Cod liver oil     Historical Provider, MD   BP 169/82 mmHg  Pulse 72  Temp(Src) 98 F (36.7 C) (Oral)  Resp 25  Ht 5' (1.524 m)  Wt 155 lb (70.308 kg)  BMI 30.27 kg/m2  SpO2 100%  LMP 10/25/2014 Physical Exam  Constitutional: She appears well-developed and well-nourished. No distress.  Awake, alert, nontoxic appearance  HENT:  Head: Normocephalic and atraumatic.  Mouth/Throat: Oropharynx is clear and moist. No oropharyngeal exudate.  Eyes: Conjunctivae are normal. No scleral icterus.  Neck: Normal range of motion. Neck supple.  Cardiovascular: Normal rate, regular rhythm, normal heart sounds and intact distal pulses.   No murmur  heard. Pulmonary/Chest: Effort normal and breath sounds normal. No respiratory distress. She has no wheezes.  Equal chest expansion Clear and equal breath sounds No tenderness to palpation  Abdominal: Soft. Bowel sounds are normal. She exhibits no mass. There is no tenderness. There is no rebound and no guarding.  Soft and nontender  Musculoskeletal: Normal range of motion. She exhibits no edema.  Neurological: She is alert.  Speech is clear and goal oriented Moves extremities without ataxia  Skin: Skin is warm and dry. She is not diaphoretic.  Psychiatric: She has a normal mood and affect.  Nursing note and vitals reviewed.   ED Course  Procedures (including critical care time) Labs Review Labs Reviewed  CBC - Abnormal; Notable for the following:    Hemoglobin 11.3 (*)    HCT 35.2 (*)    RDW 16.8 (*)    All other components within normal limits  BASIC METABOLIC PANEL  TROPONIN I  D-DIMER, QUANTITATIVE (NOT AT [facility])  TROPONIN I    Imaging Review Dg  Chest 2 View  11/14/2014   CLINICAL DATA:  Intermittent LEFT chest aching feeling, occasional upper LEFT chest pain and pain under LEFT breast, associated shortness of breath and dizziness with intermittent nausea, history hypertension  EXAM: CHEST  2 VIEW  COMPARISON:  04/09/2003  FINDINGS: Borderline enlargement of cardiac silhouette.  Tortuous aorta.  Mediastinal contours and pulmonary vascularity normal.  Lungs clear.  No pleural effusion or pneumothorax.  Bones unremarkable.  IMPRESSION: No acute abnormalities.   Electronically Signed   By: Lavonia Dana M.D.   On: 11/14/2014 14:16   I have personally reviewed and evaluated these images and lab results as part of my medical decision-making.   EKG Interpretation   Date/Time:  Sunday November 14 2014 13:23:15 EDT Ventricular Rate:  84 PR Interval:  136 QRS Duration: 82 QT Interval:  412 QTC Calculation: 486 R Axis:   79 Text Interpretation:  Normal sinus rhythm Nonspecific T  wave abnormality  Prolonged QT Abnormal ECG Confirmed by Jeneen Rinks  MD, [place] ([id number]) on  11/14/2014 3:10:06 PM Also confirmed by Jeneen Rinks  MD, [place] ([id number])  on  11/14/2014 3:25:59 PM      MDM   Final diagnoses:  Chest pain, unspecified chest pain type  Essential hypertension   Arlis Porta presents with atypical chest pain bleeding this morning with associated shortness of breath and lightheadedness but no syncope. Chest pain resolved several hours prior to arrival and has not returned. Patient was sent here to the emergency department by equal physicians due to some changes in her EKG.  EKG reviewed with nonspecific T-wave abnormality.  T-wave inversions are slightly worse than previous however are not new to her EKG. Patient is chest pain-free at this time. Her vitals are stable. She is without tachycardia.  Low risk for PE. Will obtain ACS and PE workup.  Patient is to be discharged with recommendation to follow up with PCP in regards to today's hospital visit. Chest pain is not likely of cardiac or pulmonary etiology d/t presentation, PERC negative, VSS, no tracheal deviation, no JVD or new murmur, RRR, breath sounds equal bilaterally, EKG without acute abnormalities, negative troponin x2, and negative CXR. Pt has been advised to return to the ED if CP becomes exertional, associated with diaphoresis or nausea, radiates to left jaw/arm, worsens or becomes concerning in any way. Pt appears reliable for follow up and is agreeable to discharge.   Case has been discussed with Dr. Jeneen Rinks who agrees with the above plan to discharge.    Jarrett Soho Anamae Rochelle, PA-C 11/14/14 1900  Tanna Furry, MD 11/18/14 [phone]

## 2014-11-14 NOTE — ED Notes (Signed)
Pt c/o intermittent left chest aching feeling. She sts that sometimes the pain is upper left chest and sometimes under her left breast. Pt sts it began last week and this morning it returned and she also had some SOB and dizziness. Pt sts intermittent nausea also. Pt denies pain radiation. Pt was referred here today from the [place] clinic

## 2014-11-17 ENCOUNTER — Ambulatory Visit (INDEPENDENT_AMBULATORY_CARE_PROVIDER_SITE_OTHER): Payer: 59 | Admitting: Cardiology

## 2014-11-17 ENCOUNTER — Encounter: Payer: Self-pay | Admitting: Cardiology

## 2014-11-17 VITALS — BP 130/78 | HR 83 | Ht 60.0 in | Wt 154.5 lb

## 2014-11-17 DIAGNOSIS — R079 Chest pain, unspecified: Secondary | ICD-10-CM | POA: Diagnosis not present

## 2014-11-17 DIAGNOSIS — I10 Essential (primary) hypertension: Secondary | ICD-10-CM | POA: Diagnosis not present

## 2014-11-17 DIAGNOSIS — R55 Syncope and collapse: Secondary | ICD-10-CM | POA: Diagnosis not present

## 2014-11-17 DIAGNOSIS — E785 Hyperlipidemia, unspecified: Secondary | ICD-10-CM

## 2014-11-17 DIAGNOSIS — R011 Cardiac murmur, unspecified: Secondary | ICD-10-CM

## 2014-11-17 NOTE — Progress Notes (Signed)
Cardiology Office Note   Date:  11/17/2014   ID:  Heather Rios, DOB [birth year], MRN [id number]  PCP:  Vena Austria, MD  Cardiologist:   Candee Furbish, MD       History of Present Illness: Heather Rios is a 49 y.o. female who presents for  Follow-up of chest discomfort, atypical seen in emergency department in August 2016. Sharp, fleeting, under left breast , she was concerned, went to walk-in clinic, EKG was performed, she drove over to emergency department , troponin was normal. EKG showed accentuation of nonspecific T-wave changes.  Passed out 05/03/14 at work in [place].  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 Her hands began to spasm bilaterally. She went out into the hallway in the next thing she knew she was sitting on the ground with nursing staff checking her blood pressure and blood sugar. When in the emergency room there , her orthostatics were normal. Her spasm resolved. Her potassium was found to be low and she was repleted. For losartan hydrochlorothiazide was discontinued.  HR was "off". ECHO was OK per patient. Stress test OK, Sleep Study OK.   Hydaralzine was used. Losartan/ HCT taken off. Dr. Humphrey Rolls in [place]  Evaluated her. She was concerned about potential "leaky valve" which was supposed to be followed up with 6 month follow-up.  No SCD in family. Mother has CHF 2012.  This is her first experience with syncope she states. She has had some anxiety as well as some episodes of hypoglycemia in the past. She is not had any further episodes.    Past Medical History  Diagnosis Date  . Environmental allergies   . High cholesterol   . GERD (gastroesophageal reflux disease)   . Allergic rhinitis   . Hypoglycemia   . Eczema   . Hypertension     Past Surgical History  Procedure Laterality Date  . Cholecystectomy        Current Outpatient Prescriptions  Medication Sig Dispense Refill  . azelastine (ASTELIN) 0.1 % nasal spray Place 1 spray into both nostrils 2 (two) times daily. Use in each nostril as directed    . cetirizine (ZYRTEC) 10 MG tablet Take 10 mg by mouth at bedtime.    . diclofenac (VOLTAREN) 75 MG EC tablet Take 1 tablet (75 mg total) by mouth 2 (two) times daily. 50 tablet 2  . ferrous fumarate (HEMOCYTE - 106 MG FE) 325 (106 FE) MG TABS tablet Take 1 tablet by mouth.    . ferrous sulfate 325 (65 FE) MG tablet Take 325 mg by mouth 3 (three) times daily with meals.    Marland Kitchen guaiFENesin (MUCINEX) 600 MG 12 hr tablet Take 1,200 mg by mouth 2 (two) times daily.      Marland Kitchen loratadine (CLARITIN) 10 MG tablet Take 10 mg by mouth daily.    . meloxicam (MOBIC) 15 MG tablet Take 1 tablet (15 mg total) by mouth daily. 30 tablet 2  . montelukast (SINGULAIR) 10 MG tablet Take 10 mg by mouth at bedtime.    . norgestimate-ethinyl estradiol (ORTHO-CYCLEN,SPRINTEC,PREVIFEM) 0.25-35 MG-MCG tablet Take 1 tablet by mouth daily.      . pantoprazole (PROTONIX) 40 MG tablet Take 40 mg by mouth daily.    . pravastatin (PRAVACHOL) 40 MG tablet Take 40 mg by mouth daily.      . ranitidine (  ZANTAC) 300 MG tablet Take 300 mg by mouth at bedtime.    . triamcinolone cream (KENALOG) 0.1 % Apply 1 application topically 2 (two) times daily.    . valsartan (DIOVAN) 40 MG tablet Take 40 mg by mouth daily.    . vitamin B-12 (CYANOCOBALAMIN) 100 MCG tablet Take 50 mcg by mouth daily.      Marland Kitchen zinc gluconate 50 MG tablet Take 50 mg by mouth 2 (two) times daily.      No current facility-administered medications for this visit.    Allergies:   Adhesive; Augmentin; Contrast media; Provera; Sudafed; Ciprofloxacin; Eggs or egg-derived products; Hydrocodone bitartrate er; Nexium; and Penicillins    Social History:  The patient  reports that she has never smoked. She does not have any smokeless tobacco history on file. She reports that  she does not drink alcohol or use illicit drugs.   Family History:  The patient's family history includes Heart failure in her mother.    ROS:  Please see the history of present illness.   Otherwise, review of systems are positive for none.   All other systems are reviewed and negative.    PHYSICAL EXAM: VS:  BP 130/78 mmHg  Pulse 83  Ht 5' (1.524 m)  Wt 154 lb 8 oz (70.081 kg)  BMI 30.17 kg/m2  SpO2 98%  LMP 10/25/2014 , BMI Body mass index is 30.17 kg/(m^2). GEN: Well nourished, well developed, in no acute distress HEENT: normal Neck: no JVD, carotid bruits, or masses Cardiac: RRR; soft systolic LUSB murmur, no rubs, or gallops,no edema  Respiratory:  clear to auscultation bilaterally, normal work of breathing GI: soft, nontender, nondistended, + BS MS: no deformity or atrophy Skin: warm and dry, no rash Neuro:  Strength and sensation are intact Psych: euthymic mood, full affect,  anxious   EKG:  EKG is ordered today. The ekg ordered today demonstrates  07/07/14-normal rhythm, 80, nonspecific T-wave changes   Recent Labs: 11/14/2014: BUN 14; Creatinine, Ser 0.88; Hemoglobin 11.3*; Platelets 339; Potassium 3.7; Sodium 141    Lipid Panel No results found for: CHOL, TRIG, HDL, CHOLHDL, VLDL, LDLCALC, LDLDIRECT    Wt Readings from Last 3 Encounters:  11/17/14 154 lb 8 oz (70.081 kg)  11/14/14 155 lb (70.308 kg)  07/07/14 157 lb 6.4 oz (71.396 kg)      Other studies Reviewed: Additional studies/ records that were reviewed today include: Dr. Alyson Ingles records.  Review of the above records demonstrates: as above   ASSESSMENT AND PLAN:   1. Atypical chest pain-emergency room visit in late August 2016. Troponins were normal. EKG has baseline abnormalities, nonspecific ST-T wave changes which were accentuated at this visit. Sharp, musculoskeletal/gas-like pain. We will check an echocardiogram to ensure proper structure and function of her heart. This will also help to evaluate  her murmur.  2  Prior Syncope-extensive evaluation performed by Dr. Chancy Milroy previously. This included echocardiogram, stress test, sleep study. Potassium , hypokalemia noted. hydrochlorthiazide was discontinued. Hydralazine started. Her symptoms could have been vagally mediated. No ventricular tachycardia noted in hospital monitoring.   3. 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 She is unsure which valve it is or the severity. She was told that this would be followed up in 6 month. I do hear a soft systolic murmur at left lower sternal border. Question if this is tricuspid regurgitation. We will obtain echocardiogram record from Dr. Humphrey Rolls.   4. Essential hypertension-continue to worked with Dr. Alyson Ingles.  Avoiding  HCTZ because of hypokalemia. Could reinitiate losartan.  5.  Hyperlipidemia"pravastatin.   6. GERD -ranitidine   Current medicines are reviewed at length with the patient today.  The patient does not have concerns regarding medicines.  The following changes have been made:  no change  Labs/ tests ordered today include: ECHO. No orders of the defined types were placed in this encounter.     Disposition:   FU with Aggie Douse in 6 months  Signed, Candee Furbish, MD  11/17/2014 11:13 AM    [facility] [place], [place], [place]  [address] Phone: [phone]; Fax: [phone]

## 2014-11-17 NOTE — Patient Instructions (Signed)
Medication Instructions:  Your physician recommends that you continue on your current medications as directed. Please refer to the Current Medication list given to you today.  Testing/Procedures: Your physician has requested that you have an echocardiogram. Echocardiography is a painless test that uses sound waves to create images of your heart. It provides your doctor with information about the size and shape of your heart and how well your heart's chambers and valves are working. This procedure takes approximately one hour. There are no restrictions for this procedure.  Follow-Up: Follow up in 6 months with Dr. Skains.  You will receive a letter in the mail 2 months before you are due.  Please call us when you receive this letter to schedule your follow up appointment.  Thank you for choosing [facility]!!      

## 2014-12-03 ENCOUNTER — Ambulatory Visit (HOSPITAL_COMMUNITY): Payer: 59 | Attending: Cardiology

## 2014-12-03 ENCOUNTER — Other Ambulatory Visit: Payer: Self-pay

## 2014-12-03 DIAGNOSIS — I10 Essential (primary) hypertension: Secondary | ICD-10-CM | POA: Insufficient documentation

## 2014-12-03 DIAGNOSIS — R079 Chest pain, unspecified: Secondary | ICD-10-CM | POA: Diagnosis not present

## 2014-12-03 DIAGNOSIS — I517 Cardiomegaly: Secondary | ICD-10-CM | POA: Diagnosis not present

## 2014-12-08 ENCOUNTER — Telehealth: Payer: Self-pay | Admitting: Cardiology

## 2014-12-08 NOTE — Telephone Encounter (Signed)
New message      Pt is calling to get echo results.  She is at work and cannot receive calls. However, she will call around 11am and ask for triage to hopefully get her echo results

## 2014-12-08 NOTE — Telephone Encounter (Signed)
Pt is aware of echo results. 

## 2015-01-04 ENCOUNTER — Ambulatory Visit: Payer: 59 | Admitting: Cardiology

## 2015-02-04 ENCOUNTER — Ambulatory Visit: Payer: Self-pay | Admitting: Physician Assistant

## 2015-02-04 ENCOUNTER — Encounter: Payer: Self-pay | Admitting: Physician Assistant

## 2015-02-04 VITALS — BP 140/80 | HR 76 | Temp 99.0°F

## 2015-02-04 DIAGNOSIS — J019 Acute sinusitis, unspecified: Secondary | ICD-10-CM

## 2015-02-04 DIAGNOSIS — R300 Dysuria: Secondary | ICD-10-CM

## 2015-02-04 DIAGNOSIS — J309 Allergic rhinitis, unspecified: Secondary | ICD-10-CM

## 2015-02-04 LAB — POCT URINALYSIS DIPSTICK
BILIRUBIN UA: NEGATIVE
GLUCOSE UA: NEGATIVE
Ketones, UA: NEGATIVE
Leukocytes, UA: NEGATIVE
Nitrite, UA: NEGATIVE
Protein, UA: NEGATIVE
SPEC GRAV UA: 1.025
UROBILINOGEN UA: 0.2
pH, UA: 5.5

## 2015-02-04 MED ORDER — LEVOCETIRIZINE DIHYDROCHLORIDE 5 MG PO TABS: 5.0000 mg | ORAL_TABLET | Freq: Every evening | ORAL | Status: DC

## 2015-02-04 MED ORDER — CEFDINIR 300 MG PO CAPS: 300.0000 mg | ORAL_CAPSULE | Freq: Two times a day (BID) | ORAL | Status: DC

## 2015-02-04 NOTE — Progress Notes (Signed)
S / chronic perennial allergies, ran out of xyzal ,taking zyrtec  Acute sxs x 2 weeks  O/ temp noted mildly ill appearing  ENT  Allergic changes to turbinates , boggy, sinus tenderness throat and tms clear Neck supple , heart rsr lungs clear  A/ Acute rhinosinusitis P/ omnicef. Heather Rios, supportive measures

## 2015-02-14 ENCOUNTER — Telehealth: Payer: Self-pay | Admitting: Family

## 2015-02-14 MED ORDER — FLUCONAZOLE 150 MG PO TABS: 150.0000 mg | ORAL_TABLET | Freq: Once | ORAL | Status: DC

## 2015-02-14 NOTE — Telephone Encounter (Signed)
C/O yeast sxs on antbx .diflucan sent to pharmacy . Clio FNP

## 2015-02-23 ENCOUNTER — Encounter (HOSPITAL_COMMUNITY): Payer: Self-pay

## 2015-02-23 NOTE — Patient Instructions (Addendum)
Your procedure is scheduled on:  Thursday, Dec. 22, 2016   Enter through the [employer] [facility] at:  6:00 A.M.  Pick up the phone at the desk and dial [phone].  Call this number if you have problems the morning of surgery: [phone].  Remember: Do NOT eat food or drink after:  Midnight Wednesday, Dec. 21, 2016 Take these medicines the morning of surgery with a SIP OF WATER:  Protonix,   Do NOT wear jewelry (body piercing), metal hair clips/bobby pins, make-up, or nail polish. Do NOT wear lotions, powders, or perfumes.  You may wear deoderant. Do NOT shave for 48 hours prior to surgery. Do NOT bring valuables to the hospital. Contacts, dentures, or bridgework may not be worn into surgery. Leave suitcase in car.  After surgery it may be brought to your room.  For patients admitted to the hospital, checkout time is 11:00 AM the day of discharge.

## 2015-02-24 ENCOUNTER — Encounter (HOSPITAL_COMMUNITY)
Admission: RE | Admit: 2015-02-24 | Discharge: 2015-02-24 | Disposition: A | Discharge status: Home / Self Care | Payer: 59 | Source: Ambulatory Visit | Attending: Obstetrics and Gynecology | Admitting: Obstetrics and Gynecology

## 2015-02-24 ENCOUNTER — Encounter (HOSPITAL_COMMUNITY): Payer: Self-pay

## 2015-02-24 DIAGNOSIS — D259 Leiomyoma of uterus, unspecified: Secondary | ICD-10-CM | POA: Insufficient documentation

## 2015-02-24 DIAGNOSIS — Z01818 Encounter for other preprocedural examination: Secondary | ICD-10-CM | POA: Diagnosis present

## 2015-02-24 HISTORY — DX: Cardiac murmur, unspecified: R01.1

## 2015-02-24 HISTORY — DX: Chronic sinusitis, unspecified: J32.9

## 2015-02-24 HISTORY — DX: Anemia, unspecified: D64.9

## 2015-02-24 HISTORY — DX: Plantar fascial fibromatosis: M72.2

## 2015-02-24 HISTORY — DX: Angina pectoris, unspecified: I20.9

## 2015-02-24 LAB — BASIC METABOLIC PANEL
ANION GAP: 10 (ref 5–15)
BUN: 12 mg/dL (ref 6–20)
CO2: 24 mmol/L (ref 22–32)
Calcium: 9.6 mg/dL (ref 8.9–10.3)
Chloride: 107 mmol/L (ref 101–111)
Creatinine, Ser: 0.83 mg/dL (ref 0.44–1.00)
GFR calc non Af Amer: >60 mL/min (ref >60)
GLUCOSE: 111 mg/dL — AB (ref 65–99)
POTASSIUM: 3.4 mmol/L — AB (ref 3.5–5.1)
Sodium: 141 mmol/L (ref 135–145)

## 2015-02-24 LAB — CBC
HEMATOCRIT: 33.4 % — AB (ref 36.0–46.0)
Hemoglobin: 10.6 g/dL — ABNORMAL LOW (ref 12.0–15.0)
MCH: 26 pg (ref 26.0–34.0)
MCHC: 31.7 g/dL (ref 30.0–36.0)
MCV: 82.1 fL (ref 78.0–100.0)
PLATELETS: 332 10*3/uL (ref 150–400)
RBC: 4.07 MIL/uL (ref 3.87–5.11)
RDW: 14.8 % (ref 11.5–15.5)
WBC: 5.7 10*3/uL (ref 4.0–10.5)

## 2015-02-24 LAB — TYPE AND SCREEN
ABO/RH(D): B POS
Antibody Screen: NEGATIVE

## 2015-02-24 LAB — ABO/RH: ABO/RH(D): B POS

## 2015-03-02 ENCOUNTER — Ambulatory Visit (INDEPENDENT_AMBULATORY_CARE_PROVIDER_SITE_OTHER): Payer: 59 | Admitting: Podiatry

## 2015-03-02 ENCOUNTER — Encounter: Payer: Self-pay | Admitting: Podiatry

## 2015-03-02 VITALS — BP 131/73 | HR 91 | Resp 16

## 2015-03-02 DIAGNOSIS — M779 Enthesopathy, unspecified: Secondary | ICD-10-CM | POA: Diagnosis not present

## 2015-03-02 MED ORDER — TRIAMCINOLONE ACETONIDE 10 MG/ML IJ SUSP
10.0000 mg | Freq: Once | INTRAMUSCULAR | Status: AC
  Administered 2015-03-02: 10 mg

## 2015-03-02 NOTE — Patient Instructions (Signed)

## 2015-03-04 NOTE — Progress Notes (Signed)
Subjective:     Patient ID: Heather Rios, female   DOB: [birth year], 49 y.o.   MRN: [id number]  HPI patient states that the outside of her left ankle has started to act up again and is becoming painful and she is due to have a hysterectomy performed   Review of Systems     Objective:   Physical Exam Neurovascular status intact with inflammatory changes lateral side left ankle around the peroneal tendon group as it comes under the lateral ankle and inserts into the base of the fifth metatarsal with no indication of tendon dysfunction    Assessment:     Inflammatory tendinitis left    Plan:     Careful sheath injection administered with explanation of risk and advised on ice therapy reduced activity and reappoint as needed with symptoms

## 2015-03-07 ENCOUNTER — Other Ambulatory Visit (HOSPITAL_COMMUNITY): Payer: Self-pay

## 2015-03-09 NOTE — H&P (Addendum)
History of Present Illness  General:  49 y/o presents for preop for TVH/BS due to menorrhagia, large uterine fibroids. Pt's ultrasound shows uterus 9x16x9 cm, largest fibroid is 8 cm. Pt has failed medical management, short term, and desires definitive management.  EMB neg hyperplasia or dysplasia 09/2014. October she bled 2 times in october. Last menses ended in Nov 7th. Nexplanon still in place. Pt offered to postpone surgery now that bleeding has stopped x 1 month. Pt desires to proceed with surgery.   Current Medications  Taking   Pravastatin Sodium 40 MG Tablet 1 tablet at bedtime   Zinc 100 MG Tablet 1 tablet with a meal Once a day, Notes: not sure of dosage   Iron 325 (65 Fe) MG Tablet 1 tablet two -three times a day, Notes: not sure of dosage   Valsartan 320 MG Tablet 1 tablet every morning   Singulair(Montelukast Sodium) 10 MG Tablet 1 tablet every evening   Ranitidine HCl 300 MG Tablet 1 tablet at bedtime   Nexplanon(Etonogestrel) 68 MG Implant   Pantoprazole Sodium 40 MG Tablet Delayed Release 1 tablet Once a day   Zofran(Ondansetron HCl) 4 MG Tablet 1 tablet Once a day, Notes: prn   Naprosyn(Naproxen) 500 MG Tablet 1 tablet as needed every 12 hrs   Astelin(Azelastine HCl) . spray 2 sprays Twice a day   Not-Taking/PRN   Provera(Medroxyprogesterone) 10 MG Tablet 1 tablet Once a day   Triamcinolone Acetonide 0.1 % Cream 1 application sparingly to affected area Twice a day   Cyclobenzaprine HCl 10 mg Tablet 1/2-1 tablet q 8 hrs, prn muscle spasm, Notes: as needed   Meloxicam 15 mg Tablet 1 tablet Once a day with food   EpiPen 2-Pak(EPINEPHrine) 0.3 MG/0.3ML Device as directed   Optivar(Azelastine HCl) 0.05 % Solution 1 drop into affected eye Twice a day, Notes: prn   Tylenol(APAP) 325 MG Tablet 1 tablet as needed every 6 hrs, Notes: as needed   Naproxen Sodium 550 MG Tablet 1 tablet Twice a day with food, prn pain, Notes: as needed   Medication List reviewed and reconciled with  the patient    Past Medical History  Esophageal reflux  Hypercholesterolemia  Allergic rhinitis  Hypoglycemia  Eczema, arms, abdomen and back  Fibroids   Surgical History  foot surgery   cholecystectomy 08/04/09   Family History  Father: alive 2 yrs, renal failure, hypercholesterolemia, kidney transplant receipiant  Mother: alive 44 yrs, Hyperlipidemia, MI age 5  Paternal Rosser Father: deceased  Paternal Freeborn Mother: deceased  Maternal Grand Father: deceased  Maternal Grand Mother: deceased  Brother 1: alive  1 brother(s) . 1 son(s) .   denies family h/o gyn cancers.   Social History  General:  Tobacco use  cigarettes: Never smoked Tobacco history last updated 02/24/2015 no Alcohol, never.  no Caffeine.  no Recreational drug use, never.  Exercise: yes, walks frequently on the job and at least twice a week on treadmill or bicycle.  Marital Status: single, Single.  Children: 70 year son, Rashawn.  OCCUPATION: employed, Physical and occupational therapy rehabilitation tech.  Religion: Non Denominational.  Seat belt use: always.    Gyn History  Sexual activity currently sexually active.  Periods : irregular.  LMP 10/24-11/7/16.  Denies H/O Birth control.  Last pap smear date 10/12/13, neg pap.  Last mammogram date 01/2013.  H/O Abnormal pap smear treated with cryo.    OB History  Number of pregnancies 1.  Pregnancy # 1 live birth, vaginal delivery,  6 lb 14 oz.    Allergies  IV contrast  Augmentin: hives  Cipro: gi problems  Sudafed  Adhesive Bandages  eggs  Penicillin  Hydrocodone Bitartrate: itching: Side Effects  Nexium: Nausa/ headaches: Side Effects   Hospitalization/Major Diagnostic Procedure  childbirth    Review of Systems  Denies fever/chills, chest pain, SOB, headaches, numbness/tingling. No h/o complication with anesthesia, bleeding disorders or blood clots.   Vital Signs  Wt 160, Pulse sitting 81, BP sitting 148/80.   Physical  Examination  GENERAL:  Patient appears alert and oriented.  General Appearance: well-appearing, well-developed, no acute distress.  Speech: clear.  LUNGS:  Auscultation: no wheezing/rhonchi/rales. CTA bilaterally.  HEART:  Heart sounds: normal. RRR. no murmur.  ABDOMEN:  General: soft nontender, nondistended, no masses, fibroid uterus in lower abdomen, mobile nontender.  FEMALE GENITOURINARY:  Pelvic Not, Cervix normal, Uterus irregularly enlarged, No adnexal masses, no discharge.  EXTREMITIES:  General: No edema or calf tenderness.     Assessments   1. Pre-operative clearance - Z01.818 (Primary)   2. Fibroids - D25.9   3. Menometrorrhagia - N92.1   Treatment  1. Pre-operative clearance  Start Ibuprofen Tablet, 600 MG, 1 tablet, Orally, every 6 hrs prn pain, 30 day(s), 30, Refills 1 Start Percocet 5-325 mg Tablets, 5-325 mg, one or two tablets, orally, every four to six hours prn pain, 30, Refills No Notes: Pt counseled on R/B/A of TAH/BS. Pt declines robotic hysterectomy. Pt declines continued observation with Nexplanon. Pt without hot flashes, desires to keep ovaries. Discussed HRT. Consent obtained. Remove Nexplanon in office post op.    Follow Up  2 Weeks post op

## 2015-03-10 ENCOUNTER — Inpatient Hospital Stay (HOSPITAL_COMMUNITY)
Admission: RE | Admit: 2015-03-10 | Discharge: 2015-03-12 | DRG: 743 | Disposition: A | Discharge status: Home / Self Care | Payer: 59 | Source: Ambulatory Visit | Attending: Obstetrics and Gynecology | Admitting: Obstetrics and Gynecology

## 2015-03-10 ENCOUNTER — Encounter (HOSPITAL_COMMUNITY): Payer: Self-pay | Admitting: Anesthesiology

## 2015-03-10 ENCOUNTER — Inpatient Hospital Stay (HOSPITAL_COMMUNITY): Payer: 59 | Admitting: Anesthesiology

## 2015-03-10 ENCOUNTER — Encounter (HOSPITAL_COMMUNITY)
Admission: RE | Disposition: A | Discharge status: Home / Self Care | Payer: Self-pay | Source: Ambulatory Visit | Attending: Obstetrics and Gynecology

## 2015-03-10 DIAGNOSIS — D259 Leiomyoma of uterus, unspecified: Secondary | ICD-10-CM | POA: Diagnosis present

## 2015-03-10 DIAGNOSIS — N838 Other noninflammatory disorders of ovary, fallopian tube and broad ligament: Secondary | ICD-10-CM | POA: Diagnosis present

## 2015-03-10 DIAGNOSIS — Z888 Allergy status to other drugs, medicaments and biological substances status: Secondary | ICD-10-CM

## 2015-03-10 DIAGNOSIS — J309 Allergic rhinitis, unspecified: Secondary | ICD-10-CM | POA: Diagnosis present

## 2015-03-10 DIAGNOSIS — N920 Excessive and frequent menstruation with regular cycle: Secondary | ICD-10-CM | POA: Diagnosis present

## 2015-03-10 DIAGNOSIS — M1990 Unspecified osteoarthritis, unspecified site: Secondary | ICD-10-CM | POA: Diagnosis present

## 2015-03-10 DIAGNOSIS — Z883 Allergy status to other anti-infective agents status: Secondary | ICD-10-CM | POA: Diagnosis not present

## 2015-03-10 DIAGNOSIS — E669 Obesity, unspecified: Secondary | ICD-10-CM | POA: Diagnosis present

## 2015-03-10 DIAGNOSIS — Z90710 Acquired absence of both cervix and uterus: Secondary | ICD-10-CM | POA: Diagnosis present

## 2015-03-10 DIAGNOSIS — E785 Hyperlipidemia, unspecified: Secondary | ICD-10-CM | POA: Diagnosis present

## 2015-03-10 DIAGNOSIS — D649 Anemia, unspecified: Secondary | ICD-10-CM | POA: Diagnosis present

## 2015-03-10 DIAGNOSIS — L309 Dermatitis, unspecified: Secondary | ICD-10-CM | POA: Diagnosis present

## 2015-03-10 DIAGNOSIS — Z880 Allergy status to penicillin: Secondary | ICD-10-CM | POA: Diagnosis not present

## 2015-03-10 DIAGNOSIS — Z6830 Body mass index (BMI) 30.0-30.9, adult: Secondary | ICD-10-CM

## 2015-03-10 DIAGNOSIS — K219 Gastro-esophageal reflux disease without esophagitis: Secondary | ICD-10-CM | POA: Diagnosis present

## 2015-03-10 DIAGNOSIS — Z91041 Radiographic dye allergy status: Secondary | ICD-10-CM

## 2015-03-10 HISTORY — PX: ABDOMINAL HYSTERECTOMY: SHX81

## 2015-03-10 HISTORY — PX: BILATERAL SALPINGECTOMY: SHX5743

## 2015-03-10 HISTORY — DX: Acquired absence of both cervix and uterus: Z90.710

## 2015-03-10 LAB — PREGNANCY, URINE: Preg Test, Ur: NEGATIVE

## 2015-03-10 SURGERY — HYSTERECTOMY, ABDOMINAL
Anesthesia: General

## 2015-03-10 MED ORDER — PRAVASTATIN SODIUM 40 MG PO TABS
40.0000 mg | ORAL_TABLET | Freq: Every day | ORAL | Status: DC
  Administered 2015-03-10 – 2015-03-11 (×2): 40 mg via ORAL
  Filled 2015-03-10 (×2): qty 1

## 2015-03-10 MED ORDER — SODIUM CHLORIDE 0.9 % IJ SOLN: 9.0000 mL | INTRAMUSCULAR | Status: DC | PRN

## 2015-03-10 MED ORDER — GUAIFENESIN ER 600 MG PO TB12
1200.0000 mg | ORAL_TABLET | Freq: Two times a day (BID) | ORAL | Status: DC | PRN
  Administered 2015-03-11: 1200 mg via ORAL
  Filled 2015-03-10 (×2): qty 2

## 2015-03-10 MED ORDER — FENTANYL CITRATE (PF) 100 MCG/2ML IJ SOLN
25.0000 ug | INTRAMUSCULAR | Status: DC | PRN
  Administered 2015-03-10 (×3): 50 ug via INTRAVENOUS

## 2015-03-10 MED ORDER — MONTELUKAST SODIUM 10 MG PO TABS
10.0000 mg | ORAL_TABLET | Freq: Every evening | ORAL | Status: DC | PRN
  Filled 2015-03-10: qty 1

## 2015-03-10 MED ORDER — DEXTROSE 5 % IV SOLN: 2.0000 g | INTRAVENOUS | Status: DC

## 2015-03-10 MED ORDER — LORATADINE 10 MG PO TABS
10.0000 mg | ORAL_TABLET | Freq: Every day | ORAL | Status: DC | PRN
  Filled 2015-03-10: qty 1

## 2015-03-10 MED ORDER — FENTANYL CITRATE (PF) 100 MCG/2ML IJ SOLN
INTRAMUSCULAR | Status: AC
  Filled 2015-03-10: qty 2

## 2015-03-10 MED ORDER — SIMETHICONE 80 MG PO CHEW: 80.0000 mg | CHEWABLE_TABLET | Freq: Four times a day (QID) | ORAL | Status: DC | PRN

## 2015-03-10 MED ORDER — MIDAZOLAM HCL 2 MG/2ML IJ SOLN
INTRAMUSCULAR | Status: AC
  Filled 2015-03-10: qty 2

## 2015-03-10 MED ORDER — FAMOTIDINE 20 MG PO TABS
40.0000 mg | ORAL_TABLET | Freq: Every day | ORAL | Status: DC
  Administered 2015-03-10 – 2015-03-11 (×2): 40 mg via ORAL
  Filled 2015-03-10 (×2): qty 2

## 2015-03-10 MED ORDER — FENTANYL CITRATE (PF) 250 MCG/5ML IJ SOLN
INTRAMUSCULAR | Status: AC
  Filled 2015-03-10: qty 5

## 2015-03-10 MED ORDER — LORATADINE 10 MG PO TABS
10.0000 mg | ORAL_TABLET | Freq: Every day | ORAL | Status: DC
  Administered 2015-03-11: 10 mg via ORAL
  Filled 2015-03-10 (×3): qty 1

## 2015-03-10 MED ORDER — DEXAMETHASONE SODIUM PHOSPHATE 10 MG/ML IJ SOLN
INTRAMUSCULAR | Status: DC | PRN
  Administered 2015-03-10: 4 mg via INTRAVENOUS

## 2015-03-10 MED ORDER — IBUPROFEN 800 MG PO TABS
800.0000 mg | ORAL_TABLET | Freq: Three times a day (TID) | ORAL | Status: DC | PRN
  Administered 2015-03-11 – 2015-03-12 (×3): 800 mg via ORAL
  Filled 2015-03-10 (×3): qty 1

## 2015-03-10 MED ORDER — ROCURONIUM BROMIDE 100 MG/10ML IV SOLN
INTRAVENOUS | Status: DC | PRN
  Administered 2015-03-10: 50 mg via INTRAVENOUS
  Administered 2015-03-10 (×2): 10 mg via INTRAVENOUS

## 2015-03-10 MED ORDER — TRAMADOL HCL 50 MG PO TABS
50.0000 mg | ORAL_TABLET | Freq: Four times a day (QID) | ORAL | Status: DC | PRN
  Administered 2015-03-11 (×2): 50 mg via ORAL
  Administered 2015-03-12: 100 mg via ORAL
  Filled 2015-03-10 (×4): qty 1

## 2015-03-10 MED ORDER — SENNOSIDES-DOCUSATE SODIUM 8.6-50 MG PO TABS: 1.0000 | ORAL_TABLET | Freq: Every evening | ORAL | Status: DC | PRN

## 2015-03-10 MED ORDER — HYDROMORPHONE HCL 1 MG/ML IJ SOLN
0.2000 mg | INTRAMUSCULAR | Status: DC | PRN
  Administered 2015-03-10 (×2): 0.6 mg via INTRAVENOUS
  Filled 2015-03-10 (×2): qty 1

## 2015-03-10 MED ORDER — FENTANYL CITRATE (PF) 100 MCG/2ML IJ SOLN
INTRAMUSCULAR | Status: DC | PRN
  Administered 2015-03-10: 50 ug via INTRAVENOUS
  Administered 2015-03-10: 150 ug via INTRAVENOUS
  Administered 2015-03-10: 100 ug via INTRAVENOUS
  Administered 2015-03-10: 50 ug via INTRAVENOUS
  Administered 2015-03-10: 100 ug via INTRAVENOUS
  Administered 2015-03-10: 50 ug via INTRAVENOUS
  Administered 2015-03-10: 100 ug via INTRAVENOUS

## 2015-03-10 MED ORDER — NALOXONE HCL 0.4 MG/ML IJ SOLN: 0.4000 mg | INTRAMUSCULAR | Status: DC | PRN

## 2015-03-10 MED ORDER — ONDANSETRON HCL 4 MG/2ML IJ SOLN
INTRAMUSCULAR | Status: AC
  Filled 2015-03-10: qty 2

## 2015-03-10 MED ORDER — DEXAMETHASONE SODIUM PHOSPHATE 4 MG/ML IJ SOLN
INTRAMUSCULAR | Status: AC
  Filled 2015-03-10: qty 1

## 2015-03-10 MED ORDER — DIPHENHYDRAMINE HCL 50 MG/ML IJ SOLN: 12.5000 mg | Freq: Four times a day (QID) | INTRAMUSCULAR | Status: DC | PRN

## 2015-03-10 MED ORDER — LIDOCAINE HCL (CARDIAC) 20 MG/ML IV SOLN
INTRAVENOUS | Status: AC
  Filled 2015-03-10: qty 5

## 2015-03-10 MED ORDER — ROCURONIUM BROMIDE 100 MG/10ML IV SOLN
INTRAVENOUS | Status: AC
  Filled 2015-03-10: qty 1

## 2015-03-10 MED ORDER — LACTATED RINGERS IV SOLN
INTRAVENOUS | Status: DC
  Administered 2015-03-10 (×2): via INTRAVENOUS

## 2015-03-10 MED ORDER — ONDANSETRON HCL 4 MG/2ML IJ SOLN: 4.0000 mg | Freq: Four times a day (QID) | INTRAMUSCULAR | Status: DC | PRN

## 2015-03-10 MED ORDER — PROPOFOL 10 MG/ML IV BOLUS
INTRAVENOUS | Status: DC | PRN
  Administered 2015-03-10: 200 mg via INTRAVENOUS

## 2015-03-10 MED ORDER — LACTATED RINGERS IV SOLN
INTRAVENOUS | Status: DC
  Administered 2015-03-10 (×3): via INTRAVENOUS

## 2015-03-10 MED ORDER — KETOROLAC TROMETHAMINE 30 MG/ML IJ SOLN
30.0000 mg | Freq: Four times a day (QID) | INTRAMUSCULAR | Status: DC | PRN
  Administered 2015-03-10: 30 mg via INTRAVENOUS
  Filled 2015-03-10: qty 1

## 2015-03-10 MED ORDER — GLYCOPYRROLATE 0.2 MG/ML IJ SOLN
INTRAMUSCULAR | Status: AC
  Filled 2015-03-10: qty 3

## 2015-03-10 MED ORDER — AZELASTINE HCL 0.1 % NA SOLN: 1.0000 | Freq: Two times a day (BID) | NASAL | Status: DC | PRN

## 2015-03-10 MED ORDER — NEOSTIGMINE METHYLSULFATE 10 MG/10ML IV SOLN
INTRAVENOUS | Status: AC
  Filled 2015-03-10: qty 1

## 2015-03-10 MED ORDER — NEOSTIGMINE METHYLSULFATE 10 MG/10ML IV SOLN
INTRAVENOUS | Status: DC | PRN
  Administered 2015-03-10: 4 mg via INTRAVENOUS

## 2015-03-10 MED ORDER — MENTHOL 3 MG MT LOZG
1.0000 | LOZENGE | OROMUCOSAL | Status: DC | PRN
  Administered 2015-03-10: 3 mg via ORAL
  Filled 2015-03-10: qty 9

## 2015-03-10 MED ORDER — SCOPOLAMINE 1 MG/3DAYS TD PT72
1.0000 | MEDICATED_PATCH | Freq: Once | TRANSDERMAL | Status: DC
  Administered 2015-03-10: 1.5 mg via TRANSDERMAL

## 2015-03-10 MED ORDER — KETOROLAC TROMETHAMINE 30 MG/ML IJ SOLN
INTRAMUSCULAR | Status: AC
  Filled 2015-03-10: qty 1

## 2015-03-10 MED ORDER — ONDANSETRON HCL 4 MG/2ML IJ SOLN
INTRAMUSCULAR | Status: DC | PRN
  Administered 2015-03-10: 4 mg via INTRAVENOUS

## 2015-03-10 MED ORDER — IBUPROFEN 800 MG PO TABS: 800.0000 mg | ORAL_TABLET | Freq: Three times a day (TID) | ORAL | Status: DC | PRN

## 2015-03-10 MED ORDER — PROPOFOL 10 MG/ML IV BOLUS
INTRAVENOUS | Status: AC
  Filled 2015-03-10: qty 20

## 2015-03-10 MED ORDER — PANTOPRAZOLE SODIUM 40 MG PO TBEC
40.0000 mg | DELAYED_RELEASE_TABLET | Freq: Every day | ORAL | Status: DC
  Administered 2015-03-11: 40 mg via ORAL
  Filled 2015-03-10: qty 1

## 2015-03-10 MED ORDER — ONDANSETRON HCL 4 MG PO TABS: 4.0000 mg | ORAL_TABLET | Freq: Four times a day (QID) | ORAL | Status: DC | PRN

## 2015-03-10 MED ORDER — KETOROLAC TROMETHAMINE 30 MG/ML IJ SOLN
INTRAMUSCULAR | Status: DC | PRN
  Administered 2015-03-10: 30 mg via INTRAVENOUS

## 2015-03-10 MED ORDER — LEVOCETIRIZINE DIHYDROCHLORIDE 5 MG PO TABS: 5.0000 mg | ORAL_TABLET | Freq: Every day | ORAL | Status: DC | PRN

## 2015-03-10 MED ORDER — DOCUSATE SODIUM 100 MG PO CAPS
100.0000 mg | ORAL_CAPSULE | Freq: Two times a day (BID) | ORAL | Status: DC
  Administered 2015-03-10 – 2015-03-11 (×3): 100 mg via ORAL
  Filled 2015-03-10 (×3): qty 1

## 2015-03-10 MED ORDER — CIPROFLOXACIN IN D5W 400 MG/200ML IV SOLN
400.0000 mg | INTRAVENOUS | Status: AC
  Administered 2015-03-10: 400 mg via INTRAVENOUS
  Filled 2015-03-10: qty 200

## 2015-03-10 MED ORDER — SCOPOLAMINE 1 MG/3DAYS TD PT72
MEDICATED_PATCH | TRANSDERMAL | Status: AC
  Administered 2015-03-10: 1.5 mg via TRANSDERMAL
  Filled 2015-03-10: qty 1

## 2015-03-10 MED ORDER — DIPHENHYDRAMINE HCL 12.5 MG/5ML PO ELIX: 12.5000 mg | ORAL_SOLUTION | Freq: Four times a day (QID) | ORAL | Status: DC | PRN

## 2015-03-10 MED ORDER — LIDOCAINE HCL (CARDIAC) 20 MG/ML IV SOLN
INTRAVENOUS | Status: DC | PRN
  Administered 2015-03-10: 100 mg via INTRAVENOUS

## 2015-03-10 MED ORDER — MEPERIDINE HCL 25 MG/ML IJ SOLN: 6.2500 mg | INTRAMUSCULAR | Status: DC | PRN

## 2015-03-10 MED ORDER — METRONIDAZOLE IN NACL 5-0.79 MG/ML-% IV SOLN
500.0000 mg | INTRAVENOUS | Status: AC
  Administered 2015-03-10: 500 mg via INTRAVENOUS
  Filled 2015-03-10: qty 100

## 2015-03-10 MED ORDER — METOCLOPRAMIDE HCL 5 MG/ML IJ SOLN: 10.0000 mg | Freq: Once | INTRAMUSCULAR | Status: DC | PRN

## 2015-03-10 MED ORDER — HYDROMORPHONE 1 MG/ML IV SOLN
INTRAVENOUS | Status: DC
  Administered 2015-03-10: 1.5 mg via INTRAVENOUS
  Administered 2015-03-10: 17:00:00 via INTRAVENOUS
  Administered 2015-03-11: 0.3 mg via INTRAVENOUS
  Administered 2015-03-11 (×2): 0.6 mg via INTRAVENOUS
  Filled 2015-03-10: qty 25

## 2015-03-10 MED ORDER — GLYCOPYRROLATE 0.2 MG/ML IJ SOLN
INTRAMUSCULAR | Status: DC | PRN
  Administered 2015-03-10: 0.6 mg via INTRAVENOUS

## 2015-03-10 MED ORDER — MIDAZOLAM HCL 2 MG/2ML IJ SOLN
INTRAMUSCULAR | Status: DC | PRN
  Administered 2015-03-10: 2 mg via INTRAVENOUS

## 2015-03-10 MED ORDER — IRBESARTAN 300 MG PO TABS
300.0000 mg | ORAL_TABLET | Freq: Every day | ORAL | Status: DC
  Administered 2015-03-10 – 2015-03-11 (×2): 300 mg via ORAL
  Filled 2015-03-10 (×3): qty 1

## 2015-03-10 SURGICAL SUPPLY — 35 items
BARRIER ADHS 3X4 INTERCEED (GAUZE/BANDAGES/DRESSINGS) ×1 IMPLANT
BRR ADH 4X3 ABS CNTRL BYND (GAUZE/BANDAGES/DRESSINGS) ×2
CANISTER SUCT 3000ML (MISCELLANEOUS) ×3 IMPLANT
CLOTH BEACON ORANGE TIMEOUT ST (SAFETY) ×3 IMPLANT
CONT PATH 16OZ SNAP LID 3702 (MISCELLANEOUS) ×3 IMPLANT
DRAPE WARM FLUID 44X44 (DRAPE) ×1 IMPLANT
DRSG OPSITE POSTOP 4X10 (GAUZE/BANDAGES/DRESSINGS) ×3 IMPLANT
DURAPREP 26ML APPLICATOR (WOUND CARE) ×3 IMPLANT
GAUZE SPONGE 4X4 16PLY XRAY LF (GAUZE/BANDAGES/DRESSINGS) ×1 IMPLANT
GLOVE BIO SURGEON STRL SZ7 (GLOVE) ×3 IMPLANT
GLOVE BIOGEL PI IND STRL 7.0 (GLOVE) ×8 IMPLANT
GLOVE BIOGEL PI INDICATOR 7.0 (GLOVE) ×4
GOWN STRL REUS W/TWL LRG LVL3 (GOWN DISPOSABLE) ×9 IMPLANT
LIQUID BAND (GAUZE/BANDAGES/DRESSINGS) ×1 IMPLANT
NS IRRIG 1000ML POUR BTL (IV SOLUTION) ×4 IMPLANT
PACK ABDOMINAL GYN (CUSTOM PROCEDURE TRAY) ×3 IMPLANT
PAD OB MATERNITY 4.3X12.25 (PERSONAL CARE ITEMS) ×3 IMPLANT
PENCIL SMOKE EVAC W/HOLSTER (ELECTROSURGICAL) ×3 IMPLANT
RETAINER VISCERAL (MISCELLANEOUS) ×1 IMPLANT
RTRCTR C-SECT PINK 25CM LRG (MISCELLANEOUS) ×1 IMPLANT
SPONGE LAP 18X18 X RAY DECT (DISPOSABLE) ×6 IMPLANT
SUT CHROMIC 2 0 CT 1 (SUTURE) ×6 IMPLANT
SUT MNCRL AB 4-0 PS2 18 (SUTURE) ×1 IMPLANT
SUT PLAIN 2 0 XLH (SUTURE) ×1 IMPLANT
SUT VIC AB 0 CT1 18XCR BRD8 (SUTURE) IMPLANT
SUT VIC AB 0 CT1 27 (SUTURE) ×12
SUT VIC AB 0 CT1 27XCR 8 STRN (SUTURE) ×8 IMPLANT
SUT VIC AB 0 CT1 36 (SUTURE) ×6 IMPLANT
SUT VIC AB 0 CT1 8-18 (SUTURE) ×9
SUT VIC AB 2-0 CT1 (SUTURE) ×1 IMPLANT
SUT VIC AB 4-0 KS 27 (SUTURE) ×3 IMPLANT
SUT VICRYL 0 TIES 12 18 (SUTURE) ×3 IMPLANT
TOWEL OR 17X24 6PK STRL BLUE (TOWEL DISPOSABLE) ×6 IMPLANT
TRAY FOLEY CATH SILVER 14FR (SET/KITS/TRAYS/PACK) ×3 IMPLANT
WATER STERILE IRR 1000ML POUR (IV SOLUTION) ×3 IMPLANT

## 2015-03-10 NOTE — Anesthesia Postprocedure Evaluation (Signed)
Anesthesia Post Note  Patient: JAMEELAH JUDSON  Procedure(s) Performed: Procedure(s) (LRB): HYSTERECTOMY ABDOMINAL (N/A) BILATERAL SALPINGECTOMY (Bilateral)  Patient location during evaluation: PACU Anesthesia Type: General Level of consciousness: awake and alert and oriented Pain management: pain level controlled Vital Signs Assessment: post-procedure vital signs reviewed and stable Respiratory status: spontaneous breathing, nonlabored ventilation, respiratory function stable and patient connected to nasal cannula oxygen Cardiovascular status: blood pressure returned to baseline Postop Assessment: no signs of nausea or vomiting Anesthetic complications: no    Last Vitals:  Filed Vitals:   03/10/15 1030 03/10/15 1045  BP: 145/66 154/69  Pulse: 73 70  Temp:    Resp: 18 17    Last Pain:  Filed Vitals:   03/10/15 1051  PainSc: 3                  Maelyn Berrey A.

## 2015-03-10 NOTE — Interval H&P Note (Signed)
History and Physical Interval Note:  03/10/2015 7:10 AM  Heather Rios  has presented today for surgery, with the diagnosis of  Fibroids  The various methods of treatment have been discussed with the patient and family. After consideration of risks, benefits and other options for treatment, the patient has consented to  Procedure(s): HYSTERECTOMY ABDOMINAL (N/A) BILATERAL SALPINGECTOMY (Bilateral) as a surgical intervention .  The patient's history has been reviewed, patient examined, no change in status, stable for surgery.  I have reviewed the patient's chart and labs.  Questions were answered to the patient's satisfaction.     Simona Huh, Brittlyn Cloe

## 2015-03-10 NOTE — Op Note (Signed)
NAMEILLONA, GOUGE NO.:  [id number]  MEDICAL RECORD NO.:  [id number]  LOCATION:  9306                          FACILITY:  [facility]  PHYSICIAN:  Jola Schmidt, MD   DATE OF BIRTH:  1965/07/23  DATE OF PROCEDURE: DATE OF DISCHARGE:                              OPERATIVE REPORT   PREOPERATIVE DIAGNOSIS:  Uterine fibroids and abnormal uterine bleeding.  POSTOPERATIVE DIAGNOSIS:  Uterine fibroids and abnormal uterine bleeding.  PROCEDURE:  Total abdominal hysterectomy and bilateral salpingectomy.  SURGEON:  Thurnell Lose, MD  ASSISTANT:  Dr. Janyth Pupa and Anson Crofts, RNSA for approximately 45 minutes at the start of the procedure.  EBL:  150.  URINE OUTPUT:  300 of clear urine.  ANESTHESIA:  General.  DRAINS:  Foley catheter.  LOCAL:  None.  SPECIMEN TO PATHOLOGY:  Uterus with cervix and tubes.  COUNTS:  Correct.  DISPOSITION:  To PACU, hemodynamically stable.  COMPLICATIONS:  None.  FINDINGS:  A small 8 week size uterus with anterior and posterior fibroids approximately 2 and 3 cm and a large pedunculated fibroid, irregular vascular approximate 8-9 cm on the left-hand side of the fundus. There was some pelvic congestion by of the left ovary and some fluid filled cyst on the fallopian tube.  PROCEDURE IN DETAIL:  Ms. Myracle was identified in the holding area. She was then taken to the operating room with IV running.  She underwent general endotracheal anesthesia without complication.  She was prepped and draped in a normal sterile fashion.  A time-out was performed. After the patient was draped, SCDs were on and operating and she has received preop antibiotics.  The patient reported an adhesive allergy.  She had an adhesive C-section drape, so we cut out that portion of the adhesive that was touching the patient's skin. The abdomen was then marked for a small Pfannenstiel skin incision and the incision was made with the scalpel and  carried down to the underlying layer of the fascia with the Bovie.  The Bovie was used to incise the fascia and the incision was extended laterally. The fascia was dissected off the rectus muscles sharply with the Mayo scissors.  The rectus muscles were separated at the midline with a hemostat.  The peritoneum was identified and entered sharply with a hemostat x2 and Metzenbaum scissors.  The abdomen was then stretched and the uterus was brought to the incision easily.  I initially wanted to use the [employer] retractor, but it did not lay flat on the patient.  I suspect because the depth of her pelvis was adequate.  A regular Alexis retractor was then placed.  The bowel was packed with 3 moistened laparotomy sponges.  The uterine fundus was tagged with a 0 Vicryl for mobilization.  The round ligament was then grasped with Kocher clamps x2 and transected and suture ligated with 0 Vicryl.  The pedicle on the uterus was cauterized. The bladder flap was then opened all the way around to the left-hand side.  The fallopian tubes were removed putting a window in the mesosalpinx and transecting it with a Haney clamp on a free hand tie and with a Haney stitch.  There  was a window made in the mesosalpinx to transect the utero-ovarian ligament and that was also done with free hand tie and a Haney stitch.  The same thing was done on the opposite side where the round ligaments were transected and opened and suture ligated, and the fallopian tubes were removed and the utero-ovarian ligament was transected.  Anson Crofts was assisting until Dr. Nelda Marseille was available.  The procedure with proceeding uncomplicated at the time that Dr. Nelda Marseille returned.  I did a Haney stitch on the utero-ovarian pedicle. Dr. Nelda Marseille then resumed her portion of the surgery with a Haney stitch on the utero-ovarian on the ovarian pedicle.  The broad ligament was then skeletonized to clamp the uterine arteries bilaterally.   They were clamped with Haney's and then cut and suture ligated with 0 Vicryl on both sides.  Prior to doing that, the bladder was easily displaced, pushed off the cervix very easily and nicely. Once the uterine arteries were ligated, the straight Haney's were used to skeletonize the cervix and then a curved Haney was used to grasp the uterosacral ligaments and the corner of the vaginal cuff.  Once that was done, vaginal access was obtained and we would eventually transect the cervix from the vagina intact in its entirety.  Once that was done, the cuff was closed with a 0 Vicryl in a series of figure-of-eight interrupted stitches and held.  The cuff was hemostatic and Bovie was used for cautery.  The Interceed was then applied to the cuff after copious irrigation was performed.  The 3 moistened laparotomy sponges were removed from the belly.  The peritoneum was then closed with 2-0 Vicryl in a continuous running fashion.  A  Fish retractor was placed due to bowel herniating through the opening.  The rectus muscles were well approximated once the peritoneum was closed.  The rectus muscles and fascia were inspected for bleeding, and then the fascia was closed with 2 stitches of 0 Vicryl and then I initially closed the subcutaneous space but it was not that deep and that distorted the incision, so that was opened and the skin was then reapproximated with 4-0 Monocryl in a subcuticular fashion and Dermabond was applied to the incision.  The patient tolerated the procedure well.  All instruments, sponge, and needle counts were correct x3.  She was awakened in the operating room and taken to recovery room in stable condition.     Jola Schmidt, MD     EBV/MEDQ  D:  03/10/2015  T:  03/10/2015  Job:  [id number]

## 2015-03-10 NOTE — Progress Notes (Signed)
Pt with c/o poorly controlled pain. RN reports pain went from a 10 to 4, which was pt's goal with Dilaudid IV. Ordered Toradol and a Dilaudid PCA.

## 2015-03-10 NOTE — Brief Op Note (Signed)
03/10/2015  10:45 AM  PATIENT:  Heather Rios  49 y.o. female  PRE-OPERATIVE DIAGNOSIS:   Fibroids, AUB  POST-OPERATIVE DIAGNOSIS:   same  PROCEDURE:  Procedure(s): HYSTERECTOMY ABDOMINAL (N/A) BILATERAL SALPINGECTOMY (Bilateral)  SURGEON:  Surgeon(s) and Role:    * Thurnell Lose, MD - Primary    * Janyth Pupa, DO - Assisting  PHYSICIAN ASSISTANT:   ASSISTANTS:  Tracie Sumner,RNFA  ANESTHESIA:   general  EBL:  Total I/O In: 2500 [I.V.:2500] Out: 450 [Urine:300; Blood:150]  BLOOD ADMINISTERED:none  DRAINS: Urinary Catheter (Foley)   LOCAL MEDICATIONS USED:  NONE  SPECIMEN:  Source of Specimen:  Uterus w/cervix and tubes  DISPOSITION OF SPECIMEN:  PATHOLOGY  COUNTS:  YES  TOURNIQUET:  * No tourniquets in log *  DICTATION: .Other Dictation: Dictation Number [id number]  PLAN OF CARE: Admit to inpatient   PATIENT DISPOSITION:  PACU - hemodynamically stable.   Delay start of Pharmacological VTE agent (>24hrs) due to surgical blood loss or risk of bleeding: yes

## 2015-03-10 NOTE — Interval H&P Note (Signed)
History and Physical Interval Note:  03/10/2015 7:12 AM  Heather Rios  has presented today for surgery, with the diagnosis of  Fibroids  The various methods of treatment have been discussed with the patient and family. After consideration of risks, benefits and other options for treatment, the patient has consented to  Procedure(s): HYSTERECTOMY ABDOMINAL (N/A) BILATERAL SALPINGECTOMY (Bilateral) as a surgical intervention .  The patient's history has been reviewed, patient examined, no change in status, stable for surgery.  I have reviewed the patient's chart and labs.  Questions were answered to the patient's satisfaction.    Pt denies hot flashes.  Would like to keep ovaries as previously discussed.  Simona Huh, Belicia Difatta

## 2015-03-10 NOTE — Anesthesia Procedure Notes (Signed)
Procedure Name: Intubation Date/Time: 03/10/2015 7:31 AM Performed by: Allon Costlow, Sheron Nightingale Pre-anesthesia Checklist: Patient identified, Timeout performed, Emergency Drugs available, Suction available and Patient being monitored Patient Re-evaluated:Patient Re-evaluated prior to inductionOxygen Delivery Method: Circle system utilized Preoxygenation: Pre-oxygenation with 100% oxygen Intubation Type: IV induction Ventilation: Mask ventilation without difficulty Laryngoscope Size: Mac and 3 Grade View: Grade I Tube type: Oral Tube size: 7.0 mm Number of attempts: 1 Airway Equipment and Method: Patient positioned with wedge pillow Placement Confirmation: ETT inserted through vocal cords under direct vision,  positive ETCO2 and breath sounds checked- equal and bilateral Secured at: 20 cm Dental Injury: Teeth and Oropharynx as per pre-operative assessment

## 2015-03-10 NOTE — Transfer of Care (Signed)
Immediate Anesthesia Transfer of Care Note  Patient: Heather Rios  Procedure(s) Performed: Procedure(s): HYSTERECTOMY ABDOMINAL (N/A) BILATERAL SALPINGECTOMY (Bilateral)  Patient Location: PACU  Anesthesia Type:General  Level of Consciousness: awake, alert  and oriented  Airway & Oxygen Therapy: Patient Spontanous Breathing and Patient connected to nasal cannula oxygen  Post-op Assessment: Report given to RN and Post -op Vital signs reviewed and stable  Post vital signs: Reviewed and stable  Last Vitals:  Filed Vitals:   03/10/15 0612 03/10/15 0650  BP:  145/79  Pulse: 76   Temp: 36.7 C   Resp: 20     Complications: No apparent anesthesia complications

## 2015-03-10 NOTE — Anesthesia Preprocedure Evaluation (Addendum)
Anesthesia Evaluation  Patient identified by MRN, date of birth, ID band Patient awake    Reviewed: Allergy & Precautions, NPO status , Patient's Chart, lab work & pertinent test results  Airway Mallampati: II  TM Distance: >3 FB Neck ROM: Full    Dental no notable dental hx. (+) Teeth Intact   Pulmonary neg pulmonary ROS,    Pulmonary exam normal breath sounds clear to auscultation       Cardiovascular hypertension, Pt. on medications + angina Normal cardiovascular exam+ Valvular Problems/Murmurs  Rhythm:Regular Rate:Normal  Worked up for Chest pain- negative   Neuro/Psych negative neurological ROS  negative psych ROS   GI/Hepatic GERD  Medicated and Controlled,  Endo/Other  Hyperlipidemia Obesity  Renal/GU      Musculoskeletal  (+) Arthritis , Osteoarthritis,    Abdominal (+) + obese,   Peds  Hematology  (+) anemia ,   Anesthesia Other Findings   Reproductive/Obstetrics Uterine Fibroids                            Anesthesia Physical Anesthesia Plan  ASA: II  Anesthesia Plan: General   Post-op Pain Management:    Induction: Intravenous  Airway Management Planned: Oral ETT  Additional Equipment:   Intra-op Plan:   Post-operative Plan: Extubation in OR  Informed Consent: I have reviewed the patients History and Physical, chart, labs and discussed the procedure including the risks, benefits and alternatives for the proposed anesthesia with the patient or authorized representative who has indicated his/her understanding and acceptance.   Dental advisory given  Plan Discussed with: CRNA, Anesthesiologist and Surgeon  Anesthesia Plan Comments:         Anesthesia Quick Evaluation

## 2015-03-10 NOTE — Discharge Instructions (Signed)
Abdominal Hysterectomy, Care After °Refer to this sheet in the next few weeks. These instructions provide you with information on caring for yourself after your procedure. Your health care provider may also give you more specific instructions. Your treatment has been planned according to current medical practices, but problems sometimes occur. Call your health care provider if you have any problems or questions after your procedure.  °WHAT TO EXPECT AFTER THE PROCEDURE °After your procedure, it is typical to have the following: °· Pain. °· Feeling tired. °· Poor appetite. °· Less interest in sex. °It takes 4-6 weeks to recover from this surgery.  °HOME CARE INSTRUCTIONS  °· Take pain medicines only as directed by your health care provider. Do not take over-the-counter pain medicines without checking with your health care provider first.  °· Change your bandage as directed by your health care provider. °· Return to your health care provider to have your sutures taken out. °· Take showers instead of baths for 2-3 weeks. Ask your health care provider when it is safe to start showering.  °· Do not douche, use tampons, or have sexual intercourse for at least 6 weeks or until your health care provider says you can.   °· Follow your health care provider's advice about exercise, lifting, driving, and general activities. °· Get plenty of rest and sleep.   °· Do not lift anything heavier than a gallon of milk (about 10 lb [4.5 kg]) for the first month after surgery. °· You can resume your normal diet if your health care provider says it is okay.   °· Do not drink alcohol until your health care provider says you can.   °· If you are constipated, ask your health care provider if you can take a mild laxative. °· Eating foods high in fiber may also help with constipation. Eat plenty of raw fruits and vegetables, whole grains, and beans. °· Drink enough fluids to keep your urine clear or pale yellow.   °· Try to have someone at  home with you for the first 1-2 weeks to help around the house. °· Keep all follow-up appointments. °SEEK MEDICAL CARE IF:  °· You have chills or fever. °· You have swelling, redness, or pain in the area of your incision that is getting worse.   °· You have pus coming from the incision.   °· You notice a bad smell coming from the incision or bandage.   °· Your incision breaks open.   °· You feel dizzy or light-headed.   °· You have pain or bleeding when you urinate.   °· You have persistent diarrhea.   °· You have persistent nausea and vomiting.   °· You have abnormal vaginal discharge.   °· You have a rash.   °· You have any type of abnormal reaction or develop an allergy to your medicine.   °· Your pain medicine is not helping.   °SEEK IMMEDIATE MEDICAL CARE IF:  °· You have a fever and your symptoms suddenly get worse. °· You have severe abdominal pain. °· You have chest pain. °· You have shortness of breath. °· You faint. °· You have pain, swelling, or redness of your leg. °· You have heavy vaginal bleeding with blood clots. °MAKE SURE YOU: °· Understand these instructions. °· Will watch your condition. °· Will get help right away if you are not doing well or get worse. °  °This information is not intended to replace advice given to you by your health care provider. Make sure you discuss any questions you have with your health care provider. °  °Document   Released: 09/22/2004 Document Revised: 03/26/2014 Document Reviewed: 12/26/2012 °[employer]vier Interactive Patient Education ©2016 [employer] ° °

## 2015-03-11 ENCOUNTER — Encounter (HOSPITAL_COMMUNITY): Payer: Self-pay | Admitting: Obstetrics and Gynecology

## 2015-03-11 LAB — CBC
HEMATOCRIT: 29.7 % — AB (ref 36.0–46.0)
Hemoglobin: 9.5 g/dL — ABNORMAL LOW (ref 12.0–15.0)
MCH: 26.4 pg (ref 26.0–34.0)
MCHC: 32 g/dL (ref 30.0–36.0)
MCV: 82.5 fL (ref 78.0–100.0)
PLATELETS: 263 10*3/uL (ref 150–400)
RBC: 3.6 MIL/uL — ABNORMAL LOW (ref 3.87–5.11)
RDW: 16.4 % — AB (ref 11.5–15.5)
WBC: 9.8 10*3/uL (ref 4.0–10.5)

## 2015-03-11 MED ORDER — TRAMADOL HCL 50 MG PO TABS
100.0000 mg | ORAL_TABLET | Freq: Four times a day (QID) | ORAL | Status: DC | PRN
  Administered 2015-03-11 – 2015-03-12 (×2): 100 mg via ORAL
  Filled 2015-03-11 (×2): qty 2

## 2015-03-11 NOTE — Progress Notes (Signed)
Postop Note Day # 1  S:  Patient resting comfortable in bed.  Pain controlled.  Tolerating general. No flatus, no BM.   Ambulating without difficulty.  She denies n/v/f/c, SOB, or CP.  Foley removed this am.  O: Temp:  [97.4 F (36.3 C)-99.7 F (37.6 C)] 99.1 F (37.3 C) (12/23 0537) Pulse Rate:  [70-94] 72 (12/23 0537) Resp:  [14-29] 29 (12/23 0537) BP: (140-167)/(62-83) 141/78 mmHg (12/23 0537) SpO2:  [96 %-100 %] 98 % (12/23 0537) Weight:  [72.576 kg (160 lb)] 72.576 kg (160 lb) (12/22 1140)  UOP: 1600/8hr  Gen: A&Ox3, NAD CV: RRR, no MRG Resp: CTAB Abdomen: soft, ND, appropriately tender, +BS Incision: C/D/I with dermabond Ext: No edema, no calf tenderness bilaterally  Labs:   CBC Latest Ref Rng 03/11/2015 02/24/2015 11/14/2014  WBC 4.0 - 10.5 K/uL 9.8 5.7 5.2  Hemoglobin 12.0 - 15.0 g/dL 9.5(L) 10.6(L) 11.3(L)  Hematocrit 36.0 - 46.0 % 29.7(L) 33.4(L) 35.2(L)  Platelets 150 - 400 K/uL 263 332 339    A/P: Pt is a 49 y.o.  s/p TAH, BS, POD#1  - Pain well controlled -GU: UOP adequate, foley removed this am -GI: Tolerating general diet -Activity: encouraged sitting up to chair and ambulation as tolerated -Prophylaxis: early ambulation, SCDs while in bed -Labs: stable as above -HTN: BP elevated overnight, pt to continue home medication  DISPO: Continue with routine postoperative care, plan for discharge home tomorrow once voiding freely and passing gas.  Janyth Pupa, DO [phone] (pager) [phone] (office)

## 2015-03-11 NOTE — Addendum Note (Signed)
Addendum  created 03/11/15 0824 by Jonna Munro, CRNA   Modules edited: Clinical Notes   Clinical Notes:  File: [id number]

## 2015-03-11 NOTE — Anesthesia Postprocedure Evaluation (Signed)
Anesthesia Post Note  Patient: Heather Rios  Procedure(s) Performed: Procedure(s) (LRB): HYSTERECTOMY ABDOMINAL (N/A) BILATERAL SALPINGECTOMY (Bilateral)  Patient location during evaluation: [facility] Anesthesia Type: General Level of consciousness: awake and alert Pain management: pain level controlled Vital Signs Assessment: post-procedure vital signs reviewed and stable Respiratory status: spontaneous breathing and nonlabored ventilation Cardiovascular status: blood pressure returned to baseline Postop Assessment: no headache, no backache, adequate PO intake and no signs of nausea or vomiting Anesthetic complications: no    Last Vitals:  Filed Vitals:   03/11/15 0534 03/11/15 0537  BP:  141/78  Pulse:  72  Temp:  37.3 C  Resp: 19 29    Last Pain:  Filed Vitals:   03/11/15 0538  PainSc: 4                  Maksim Peregoy

## 2015-03-12 MED ORDER — TRAMADOL HCL 50 MG PO TABS: 100.0000 mg | ORAL_TABLET | Freq: Four times a day (QID) | ORAL | Status: DC | PRN

## 2015-03-12 NOTE — Discharge Summary (Signed)
Physician Discharge Summary  Patient ID: Heather Rios MRN: [id number] DOB/AGE: 49-29-1967 49 y.o.  Admit date: 03/10/2015 Discharge date: 03/12/2015  Admission Diagnoses:1. Uterine Fibroids 2. Menorrhagia   Discharge Diagnoses: Same  Active Problems:   S/P TAH (total abdominal hysterectomy) Bilateral salpingectomy   Discharged Condition: stable  Hospital Course: Pt was admitted 03/10/2015 after undergoing Total abdominal hysterectomy and bilateral salpingectomy for management of Menorrhagia and fibroids. She did well postoperatively with return of bowel and bladder function. She is discharged on POD #2.   Consults: None  Significant Diagnostic Studies: labs: hgb 9.5  Treatments: surgery: TAH bilateral salpingectomy   Discharge Exam: Blood pressure 129/73, pulse 68, temperature 98.1 F (36.7 C), temperature source Oral, resp. rate 18, height 5\' 1"  (1.549 m), weight 160 lb (72.576 kg), SpO2 98 %. General appearance: alert and cooperative Resp: clear to auscultation bilaterally Cardio: regular rate and rhythm GI: soft nontender nondistended + BS  Extremities: extremities normal, atraumatic, no cyanosis or edema Incision/Wound:Clean dry and intact   Disposition: 01-Home or Self Care  Discharge Instructions    Call MD for:  persistant nausea and vomiting    Complete by:  As directed      Call MD for:  redness, tenderness, or signs of infection (pain, swelling, redness, odor or green/yellow discharge around incision site)    Complete by:  As directed      Call MD for:  severe uncontrolled pain    Complete by:  As directed      Call MD for:  temperature >100.4    Complete by:  As directed      Diet - low sodium heart healthy    Complete by:  As directed      Driving Restrictions    Complete by:  As directed   Avoid driving for 2 weeks     Increase activity slowly    Complete by:  As directed      Lifting restrictions    Complete by:  As directed   Avoid lifting over  10 lbs     Sexual Activity Restrictions    Complete by:  As directed   Avoid sex            Medication List    TAKE these medications        azelastine 0.1 % nasal spray  Commonly known as:  ASTELIN  Place 1 spray into both nostrils 2 (two) times daily as needed for allergies.     cefdinir 300 MG capsule  Commonly known as:  OMNICEF  Take 1 capsule (300 mg total) by mouth 2 (two) times daily.     cetirizine 10 MG tablet  Commonly known as:  ZYRTEC  Take 10 mg by mouth at bedtime as needed for allergies.     diclofenac 75 MG EC tablet  Commonly known as:  VOLTAREN  Take 1 tablet (75 mg total) by mouth 2 (two) times daily.     ferrous sulfate 325 (65 FE) MG tablet  Take 325 mg by mouth 3 (three) times daily as needed (for heaving bleeding).     fluconazole 150 MG tablet  Commonly known as:  DIFLUCAN  Take 1 tablet (150 mg total) by mouth once. May repeat in 2 -3 days prn     guaiFENesin 600 MG 12 hr tablet  Commonly known as:  MUCINEX  Take 1,200 mg by mouth 2 (two) times daily as needed for to loosen phlegm.     ibuprofen 800  MG tablet  Commonly known as:  ADVIL,MOTRIN  Take 1 tablet (800 mg total) by mouth every 8 (eight) hours as needed (mild pain).     levocetirizine 5 MG tablet  Commonly known as:  XYZAL  Take 1 tablet (5 mg total) by mouth every evening.     loratadine 10 MG tablet  Commonly known as:  CLARITIN  Take 10 mg by mouth daily as needed for allergies.     meloxicam 15 MG tablet  Commonly known as:  MOBIC  Take 1 tablet (15 mg total) by mouth daily.     montelukast 10 MG tablet  Commonly known as:  SINGULAIR  Take 10 mg by mouth at bedtime as needed (for allergies).     pantoprazole 40 MG tablet  Commonly known as:  PROTONIX  Take 40 mg by mouth daily.     pravastatin 40 MG tablet  Commonly known as:  PRAVACHOL  Take 40 mg by mouth daily.     ranitidine 300 MG tablet  Commonly known as:  ZANTAC  Take 300 mg by mouth at bedtime.      traMADol 50 MG tablet  Commonly known as:  ULTRAM  Take 2 tablets (100 mg total) by mouth every 6 (six) hours as needed for severe pain.     triamcinolone cream 0.1 %  Commonly known as:  KENALOG  Apply 1 application topically 2 (two) times daily as needed (itchy skin).     valsartan 320 MG tablet  Commonly known as:  DIOVAN  Take 1 tablet by mouth at bedtime.     vitamin B-12 100 MCG tablet  Commonly known as:  CYANOCOBALAMIN  Take 100 mcg by mouth daily.     zinc gluconate 50 MG tablet  Take 50 mg by mouth daily.           Follow-up Information    Follow up with Thurnell Lose, MD In 2 weeks.   Specialty:  Obstetrics and Gynecology   Contact information:   [id number] [place] [address] [phone]       Signed: Catha Brow. 03/12/2015, 8:09 AM

## 2015-03-28 DIAGNOSIS — Z90710 Acquired absence of both cervix and uterus: Secondary | ICD-10-CM | POA: Diagnosis not present

## 2015-03-28 DIAGNOSIS — Z3046 Encounter for surveillance of implantable subdermal contraceptive: Secondary | ICD-10-CM | POA: Diagnosis not present

## 2015-03-31 MED FILL — VALSARTAN 320 MG TABLET: 320 | 30 days supply | Qty: 30 | Fill #0

## 2015-04-11 DIAGNOSIS — M5431 Sciatica, right side: Secondary | ICD-10-CM | POA: Diagnosis not present

## 2015-04-11 DIAGNOSIS — H1010 Acute atopic conjunctivitis, unspecified eye: Secondary | ICD-10-CM | POA: Diagnosis not present

## 2015-04-11 DIAGNOSIS — T7808XD Anaphylactic reaction due to eggs, subsequent encounter: Secondary | ICD-10-CM | POA: Diagnosis not present

## 2015-04-11 MED FILL — EPINEPHRINE 0.3 MG AUTO-INJ: 0.3 | 15 days supply | Qty: 2 | Fill #0

## 2015-04-11 MED FILL — NAPROXEN 500 MG TABLET: 500 | 30 days supply | Qty: 60 | Fill #0

## 2015-04-20 DIAGNOSIS — M545 Low back pain: Secondary | ICD-10-CM | POA: Diagnosis not present

## 2015-04-25 DIAGNOSIS — E7800 Pure hypercholesterolemia, unspecified: Secondary | ICD-10-CM | POA: Diagnosis not present

## 2015-04-25 DIAGNOSIS — K219 Gastro-esophageal reflux disease without esophagitis: Secondary | ICD-10-CM | POA: Diagnosis not present

## 2015-04-25 DIAGNOSIS — I10 Essential (primary) hypertension: Secondary | ICD-10-CM | POA: Diagnosis not present

## 2015-05-02 MED FILL — VALSARTAN 320 MG TABLET: 320 | 30 days supply | Qty: 30 | Fill #1

## 2015-05-02 MED FILL — LEVOCETIRIZINE 5 MG TABLET: 5 | 30 days supply | Qty: 30 | Fill #0

## 2015-05-18 ENCOUNTER — Encounter: Payer: Self-pay | Admitting: *Deleted

## 2015-05-23 ENCOUNTER — Ambulatory Visit (INDEPENDENT_AMBULATORY_CARE_PROVIDER_SITE_OTHER): Payer: 59 | Admitting: Cardiology

## 2015-05-23 ENCOUNTER — Encounter: Payer: Self-pay | Admitting: Cardiology

## 2015-05-23 VITALS — BP 120/72 | HR 70 | Ht 61.0 in | Wt 158.6 lb

## 2015-05-23 DIAGNOSIS — K219 Gastro-esophageal reflux disease without esophagitis: Secondary | ICD-10-CM | POA: Diagnosis not present

## 2015-05-23 DIAGNOSIS — R079 Chest pain, unspecified: Secondary | ICD-10-CM

## 2015-05-23 DIAGNOSIS — R55 Syncope and collapse: Secondary | ICD-10-CM

## 2015-05-23 DIAGNOSIS — E785 Hyperlipidemia, unspecified: Secondary | ICD-10-CM | POA: Diagnosis not present

## 2015-05-23 DIAGNOSIS — I10 Essential (primary) hypertension: Secondary | ICD-10-CM

## 2015-05-23 NOTE — Progress Notes (Signed)
Cardiology Office Note   Date:  05/23/2015   ID:  Heather Rios, DOB 10/31/1965, MRN [id number]  PCP:  Heather Austria, MD  Cardiologist:   Candee Furbish, MD       History of Present Illness: Heather Rios is a 50 y.o. female who presents for  Follow-up of chest discomfort, atypical seen in emergency department in August 2016. Sharp, fleeting, under left breast , she was concerned, went to walk-in clinic, EKG was performed, she drove over to emergency department , troponin was normal. EKG showed accentuation of nonspecific T-wave changes.  Passed out 05/03/14 at work in [place], [place].  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 Her hands began to spasm bilaterally. She went out into the hallway in the next thing she knew she was sitting on the ground with nursing staff checking her blood pressure and blood sugar. When in the emergency room there , her orthostatics were normal. Her spasm resolved. Her potassium was found to be low and she was repleted. For losartan hydrochlorothiazide was discontinued.  HR was "off". ECHO was OK per patient. Stress test OK, Sleep Study OK.   Hydaralzine was used. Losartan/ HCT taken off. Dr. Humphrey Rolls in [place]  Evaluated her. She was concerned about potential "leaky valve" which was supposed to be followed up with 6 month follow-up.  No SCD in family. Mother has CHF 2012.  This is her first experience with syncope she states. She has had some anxiety as well as some episodes of hypoglycemia in the past. She is not had any further episodes.  03/14/15-hysterectomy. Iron levels have improved. She has had no further syncope. No dizziness, no lightheadedness. She is walking quite a bit at work at [employer]. Occasionally at night she will have some numbness/tingling in her right hand when laying on it. Likely carpal tunnel type  symptoms. Told her about splint. No chest pain, no shortness of breath, no syncope. Help take care of her mother in the hospital.   Past Medical History  Diagnosis Date  . Environmental allergies   . High cholesterol   . GERD (gastroesophageal reflux disease)   . Allergic rhinitis   . Hypoglycemia   . Eczema   . Hypertension   . Anginal pain ([place])     Chest Pains 8/16  . Heart murmur   . Plantar fasciitis   . Sinusitis, chronic   . Hypoglycemia   . Anemia     Past Surgical History  Procedure Laterality Date  . Cholecystectomy    . Foot surgery    . Abdominal hysterectomy N/A 03/10/2015    Procedure: HYSTERECTOMY ABDOMINAL;  Surgeon: Thurnell Lose, MD;  Location: [facility];  Service: Gynecology;  Laterality: N/A;  . Bilateral salpingectomy Bilateral 03/10/2015    Procedure: BILATERAL SALPINGECTOMY;  Surgeon: Thurnell Lose, MD;  Location: [facility];  Service: Gynecology;  Laterality: Bilateral;     Current Outpatient Prescriptions  Medication Sig Dispense Refill  . azelastine (ASTELIN) 0.1 % nasal spray Place 1 spray into both nostrils 2 (two) times daily as needed for allergies.     . cetirizine (ZYRTEC) 10 MG tablet Take 10 mg by mouth at bedtime as needed for allergies.     . ferrous sulfate 325 (65 FE) MG tablet Take 325 mg by mouth 3 (three) times daily as needed (for heaving bleeding).     Marland Kitchen  guaiFENesin (MUCINEX) 600 MG 12 hr tablet Take 1,200 mg by mouth 2 (two) times daily as needed for to loosen phlegm.     Marland Kitchen levocetirizine (XYZAL) 5 MG tablet Take 1 tablet (5 mg total) by mouth every evening. 30 tablet 11  . loratadine (CLARITIN) 10 MG tablet Take 10 mg by mouth daily as needed for allergies.     . meloxicam (MOBIC) 15 MG tablet Take 15 mg by mouth daily as needed for pain.    . montelukast (SINGULAIR) 10 MG tablet Take 10 mg by mouth at bedtime as needed (for allergies).     . pantoprazole (PROTONIX) 40 MG tablet Take 40 mg by mouth daily.    . pravastatin (PRAVACHOL)  40 MG tablet Take 40 mg by mouth daily.      . ranitidine (ZANTAC) 300 MG tablet Take 300 mg by mouth at bedtime.    . triamcinolone cream (KENALOG) 0.1 % Apply 1 application topically 2 (two) times daily as needed (itchy skin).     . valsartan (DIOVAN) 320 MG tablet Take 1 tablet by mouth at bedtime.  4  . vitamin B-12 (CYANOCOBALAMIN) 100 MCG tablet Take 100 mcg by mouth daily.     Marland Kitchen zinc gluconate 50 MG tablet Take 50 mg by mouth daily.      No current facility-administered medications for this visit.    Allergies:   Contrast media; Adhesive; Augmentin; Eggs or egg-derived products; Hydrocodone bitartrate er; Nexium; and Sudafed    Social History:  The patient  reports that she has never smoked. She has never used smokeless tobacco. She reports that she does not drink alcohol or use illicit drugs.   Family History:  The patient's family history includes Heart attack (age of onset: 32) in her mother; Heart failure in her mother; Hyperlipidemia in her father and mother; Kidney failure in her father.    ROS:  Please see the history of present illness.   Otherwise, review of systems are positive for none.   All other systems are reviewed and negative.    PHYSICAL EXAM: VS:  BP 120/72 mmHg  Pulse 70  Ht 5\' 1"  (1.549 m)  Wt 158 lb 9.6 oz (71.94 kg)  BMI 29.98 kg/m2  SpO2 98% , BMI Body mass index is 29.98 kg/(m^2). GEN: Well nourished, well developed, in no acute distress HEENT: normal Neck: no JVD, carotid bruits, or masses Cardiac: RRR; soft systolic LUSB murmur, no rubs, or gallops,no edema  Respiratory:  clear to auscultation bilaterally, normal work of breathing GI: soft, nontender, nondistended, + BS MS: no deformity or atrophy Skin: warm and dry, no rash Neuro:  Strength and sensation are intact Psych: euthymic mood, full affect,  anxious   EKG: The ekg from  07/07/14-normal rhythm, 80, nonspecific T-wave changes   Recent Labs: 02/24/2015: BUN 12; Creatinine, Ser 0.83;  Potassium 3.4*; Sodium 141 03/11/2015: Hemoglobin 9.5*; Platelets 263    Lipid Panel No results found for: CHOL, TRIG, HDL, CHOLHDL, VLDL, LDLCALC, LDLDIRECT    Wt Readings from Last 3 Encounters:  05/23/15 158 lb 9.6 oz (71.94 kg)  03/10/15 160 lb (72.576 kg)  02/24/15 159 lb 2 oz (72.179 kg)      Other studies Reviewed: Additional studies/ records that were reviewed today include: Dr. Alyson Ingles records.  Review of the above records demonstrates: as above  ECHO 12/03/14: - Left ventricle: The cavity size was normal. Systolic function wasnormal. The estimated ejection fraction was in the range of 55%to 60%. Wall  motion was normal; there were no regional wallmotion abnormalities. - Left atrium: The atrium was mildly dilated. - Atrial septum: No defect or patent foramen ovale was identified.  ASSESSMENT AND PLAN:   1. Atypical chest pain-emergency room visit in late August 2016. Troponins were normal. EKG has baseline abnormalities, nonspecific ST-T wave changes which were accentuated at this visit. Sharp, musculoskeletal/gas-like pain. Echocardiogram normal EF with  proper structure and function of her heart. No obvious source of murmur. Reassuring workup. No further symptoms.  2  Prior Syncope-extensive evaluation performed by Dr. Chancy Milroy previously. This included echocardiogram, stress test, sleep study. Potassium , hypokalemia noted. hydrochlorthiazide was discontinued. Hydralazine started. Her symptoms could have been vagally mediated. No ventricular tachycardia noted in hospital monitoring.   3. 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 She is unsure which valve it is or the severity. She was told that this would be followed up in 6 month. I do hear a soft systolic murmur at left lower sternal border. ECHO was reassuring, no significant valvular issues.     4. Essential hypertension-excellent control. Medications reviewed. Avoiding HCTZ because of hypokalemia.    5.  Hyperlipidemia pravastatin.  6. GERD -ranitidine at night, Protonix in the morning   Current medicines are reviewed at length with the patient today.  The patient does not have concerns regarding medicines.  The following changes have been made:  no change  Labs/ tests ordered today include: No new orders   Disposition:   FU with Anadalay Macdonell as needed  Bobby Rumpf, MD  05/23/2015 8:39 AM    [facility] [address], [place], [place]  [address] Phone: [phone]; Fax: [phone]

## 2015-05-23 NOTE — Patient Instructions (Signed)
Medication Instructions:  The current medical regimen is effective;  continue present plan and medications.  Follow-Up: Follow up as needed with Dr Skains.  If you need a refill on your cardiac medications before your next appointment, please call your pharmacy.  Thank you for choosing [facility]!!     

## 2015-06-24 DIAGNOSIS — J0100 Acute maxillary sinusitis, unspecified: Secondary | ICD-10-CM | POA: Diagnosis not present

## 2015-07-01 ENCOUNTER — Ambulatory Visit: Payer: Self-pay | Admitting: Physician Assistant

## 2015-07-01 ENCOUNTER — Encounter: Payer: Self-pay | Admitting: Physician Assistant

## 2015-07-01 VITALS — BP 110/70 | HR 77 | Temp 97.6°F

## 2015-07-01 DIAGNOSIS — J209 Acute bronchitis, unspecified: Secondary | ICD-10-CM

## 2015-07-01 MED ORDER — PROMETHAZINE-DM 6.25-15 MG/5ML PO SYRP: 5.0000 mL | ORAL_SOLUTION | Freq: Four times a day (QID) | ORAL | Status: DC | PRN

## 2015-07-01 MED ORDER — CLOTRIMAZOLE 1 % VA CREA: 1.0000 | TOPICAL_CREAM | Freq: Every day | VAGINAL | Status: DC

## 2015-07-01 MED ORDER — SULFAMETHOXAZOLE-TRIMETHOPRIM 800-160 MG PO TABS: 1.0000 | ORAL_TABLET | Freq: Two times a day (BID) | ORAL | Status: DC

## 2015-07-01 NOTE — Progress Notes (Signed)
   Subjective: cold symptoms    Patient ID: Heather Rios, female    DOB: [birth year], 50 y.o.   MRN: [id number]  HPI Patient states one week of sinus congestions. States in last 2 days developed cheat congestion and productive cough. Denies fever/chill or nausea, vomiting or diarrhea.Currently taking OTC allergy medications.    Review of Systems Hypertension and Hyperlipidema    Objective:   Physical Exam Bilateral maxillary guarding, edematous nasal turbinates. Post nasal drainage. Neck supple without adenopathy. Lungs with bilateral Rales.  Heart RRR.       Assessment & Plan:Bronchitis  Bactrim DS and Phenergan DM. Follow up 3 days if not improvement.

## 2015-07-01 NOTE — Addendum Note (Signed)
Addended by: Sable Feil on: 07/01/2015 03:03 PM   Modules accepted: Orders

## 2015-07-06 ENCOUNTER — Telehealth: Payer: Self-pay | Admitting: Physician Assistant

## 2015-07-06 NOTE — Telephone Encounter (Signed)
I spoke to Heather Rios in regards to patient .She Instructed me to ask patient color of mucus if clear continue to take allergy medication due to pollen. Contacted patient who stated that mucous is clear. I then instructed her to take allergy medication  per patient acknowledge understanding.

## 2015-07-14 DIAGNOSIS — R05 Cough: Secondary | ICD-10-CM | POA: Diagnosis not present

## 2015-07-14 DIAGNOSIS — K219 Gastro-esophageal reflux disease without esophagitis: Secondary | ICD-10-CM | POA: Diagnosis not present

## 2015-07-14 DIAGNOSIS — J309 Allergic rhinitis, unspecified: Secondary | ICD-10-CM | POA: Diagnosis not present

## 2015-07-14 MED FILL — PROMETHAZINE-DM SYRUP: 6.25-15 | 5 days supply | Qty: 100 | Fill #0

## 2015-07-26 ENCOUNTER — Other Ambulatory Visit: Payer: Self-pay | Admitting: Family Medicine

## 2015-07-26 DIAGNOSIS — Z1231 Encounter for screening mammogram for malignant neoplasm of breast: Secondary | ICD-10-CM

## 2015-08-03 ENCOUNTER — Ambulatory Visit
Admission: RE | Admit: 2015-08-03 | Discharge: 2015-08-03 | Disposition: A | Discharge status: Home / Self Care | Payer: 59 | Source: Ambulatory Visit | Attending: Family Medicine | Admitting: Family Medicine

## 2015-08-03 DIAGNOSIS — Z1231 Encounter for screening mammogram for malignant neoplasm of breast: Secondary | ICD-10-CM | POA: Diagnosis present

## 2015-08-22 ENCOUNTER — Ambulatory Visit: Payer: Self-pay | Admitting: Physician Assistant

## 2015-08-22 ENCOUNTER — Encounter: Payer: Self-pay | Admitting: Physician Assistant

## 2015-08-22 VITALS — BP 130/80 | HR 64 | Temp 98.8°F

## 2015-08-22 DIAGNOSIS — J018 Other acute sinusitis: Secondary | ICD-10-CM

## 2015-08-22 MED ORDER — FLUCONAZOLE 150 MG PO TABS: ORAL_TABLET | ORAL | Status: DC

## 2015-08-22 MED ORDER — PREDNISONE 10 MG PO TABS: 30.0000 mg | ORAL_TABLET | Freq: Every day | ORAL | Status: DC

## 2015-08-22 MED ORDER — CEFDINIR 300 MG PO CAPS: 300.0000 mg | ORAL_CAPSULE | Freq: Two times a day (BID) | ORAL | Status: DC

## 2015-08-22 NOTE — Progress Notes (Signed)
S: C/o runny nose and congestion for 3 days, no fever, chills, cp/sob, v/d; mucus is green and thick, cough is sporadic, c/o of facial and dental pain. Coworkers are also sick  Using otc meds:   O: PE: vitals wnl, nad, perrl eomi, normocephalic, tms dull, nasal mucosa red and swollen on r side, boggy on left, throat injected, neck supple no lymph, lungs c t a, cv rrr, neuro intact  A:  Acute sinusitis   P: omnicef 300mg  bid x 10d, prednisone 30mg  qd x 3d, diflucan, drink fluids, continue regular meds , use otc meds of choice, return if not improving in 5 days, return earlier if worsening

## 2015-09-19 DIAGNOSIS — K219 Gastro-esophageal reflux disease without esophagitis: Secondary | ICD-10-CM | POA: Diagnosis not present

## 2015-09-26 DIAGNOSIS — B373 Candidiasis of vulva and vagina: Secondary | ICD-10-CM | POA: Diagnosis not present

## 2015-09-26 DIAGNOSIS — H1013 Acute atopic conjunctivitis, bilateral: Secondary | ICD-10-CM | POA: Diagnosis not present

## 2015-09-26 DIAGNOSIS — N898 Other specified noninflammatory disorders of vagina: Secondary | ICD-10-CM | POA: Diagnosis not present

## 2015-10-24 DIAGNOSIS — J309 Allergic rhinitis, unspecified: Secondary | ICD-10-CM | POA: Diagnosis not present

## 2015-10-24 DIAGNOSIS — K219 Gastro-esophageal reflux disease without esophagitis: Secondary | ICD-10-CM | POA: Diagnosis not present

## 2015-10-24 DIAGNOSIS — E7800 Pure hypercholesterolemia, unspecified: Secondary | ICD-10-CM | POA: Diagnosis not present

## 2015-10-24 DIAGNOSIS — Z0000 Encounter for general adult medical examination without abnormal findings: Secondary | ICD-10-CM | POA: Diagnosis not present

## 2015-10-24 DIAGNOSIS — G5602 Carpal tunnel syndrome, left upper limb: Secondary | ICD-10-CM | POA: Diagnosis not present

## 2015-10-24 DIAGNOSIS — I10 Essential (primary) hypertension: Secondary | ICD-10-CM | POA: Diagnosis not present

## 2015-10-24 DIAGNOSIS — L309 Dermatitis, unspecified: Secondary | ICD-10-CM | POA: Diagnosis not present

## 2015-10-24 DIAGNOSIS — Z1211 Encounter for screening for malignant neoplasm of colon: Secondary | ICD-10-CM | POA: Diagnosis not present

## 2015-10-28 ENCOUNTER — Ambulatory Visit: Payer: Self-pay | Admitting: Physician Assistant

## 2015-10-28 ENCOUNTER — Other Ambulatory Visit: Payer: Self-pay | Admitting: Physician Assistant

## 2015-10-28 ENCOUNTER — Encounter: Payer: Self-pay | Admitting: Physician Assistant

## 2015-10-28 VITALS — BP 120/80 | HR 60 | Temp 98.4°F

## 2015-10-28 DIAGNOSIS — J0120 Acute ethmoidal sinusitis, unspecified: Secondary | ICD-10-CM

## 2015-10-28 MED ORDER — SULFAMETHOXAZOLE-TRIMETHOPRIM 800-160 MG PO TABS: 1.0000 | ORAL_TABLET | Freq: Two times a day (BID) | ORAL | 0 refills | Status: DC

## 2015-10-28 MED ORDER — CIPROFLOXACIN-DEXAMETHASONE 0.3-0.1 % OT SUSP: 4.0000 [drp] | Freq: Two times a day (BID) | OTIC | 0 refills | Status: DC

## 2015-10-28 MED ORDER — NEOMYCIN-COLIST-HC-THONZONIUM 3.3-3-10-0.5 MG/ML OT SUSP: 4.0000 [drp] | Freq: Four times a day (QID) | OTIC | 0 refills | Status: DC

## 2015-10-28 MED ORDER — FEXOFENADINE HCL 60 MG PO TABS: 60.0000 mg | ORAL_TABLET | Freq: Two times a day (BID) | ORAL | Status: AC

## 2015-10-28 NOTE — Progress Notes (Signed)
   Subjective:    Patient ID: Heather Rios, female    DOB: January 14, 1966, 50 y.o.   MRN: [id number]  HPIPatient c/o one week of sinus congestion and right ear pain.Denies any other URI sign/symptoms. Denies sore throat or N/V/D. States post nasal drainage and intermitting running nose.    Review of Systems    Negative except for compliant. Objective:   Physical Exam Bilateral frontal and maxillary guarding. Edematous bilateral nasal turbinates. Right ear TM is edematous without erythema. Neck supple, Lungs CTA, and Heart RRR       Assessment & Plan:Sinusitis  Bactrim Ds and Allergra as directed. Follow up PRN.

## 2015-10-31 ENCOUNTER — Telehealth: Payer: Self-pay | Admitting: Physician Assistant

## 2015-10-31 NOTE — Telephone Encounter (Signed)
Patient contacted office requesting generic for Ciprodex. cost was expensive. Spoke with Manuela Schwartz inform patient   Not to take ciprodex otic. Just do the bactrim if not any better by Friday will call in a steroid. Patient acknowledge understanding.

## 2015-11-11 ENCOUNTER — Ambulatory Visit: Payer: Self-pay | Admitting: Occupational Therapy

## 2015-11-15 ENCOUNTER — Ambulatory Visit: Payer: Self-pay | Attending: Family Medicine | Admitting: Occupational Therapy

## 2015-11-15 DIAGNOSIS — I10 Essential (primary) hypertension: Secondary | ICD-10-CM | POA: Insufficient documentation

## 2015-11-15 NOTE — Therapy (Signed)
[facility] [address], [name], [address] Phone: [phone]   Fax:  [phone]  Occupational Therapy screen   Patient Details  Name: Heather Rios MRN: [id number] Date of Birth: Jan 13, 1966 No Data Recorded  Encounter Date: 11/15/2015    Past Medical History:  Diagnosis Date  . Allergic rhinitis   . Anemia   . Anginal pain ([place])    Chest Pains 8/16  . Eczema   . Environmental allergies   . GERD (gastroesophageal reflux disease)   . Heart murmur   . High cholesterol   . Hypertension   . Hypoglycemia   . Hypoglycemia   . Plantar fasciitis   . Sinusitis, chronic     Past Surgical History:  Procedure Laterality Date  . ABDOMINAL HYSTERECTOMY N/A 03/10/2015   Procedure: HYSTERECTOMY ABDOMINAL;  Surgeon: Thurnell Lose, MD;  Location: [facility];  Service: Gynecology;  Laterality: N/A;  . BILATERAL SALPINGECTOMY Bilateral 03/10/2015   Procedure: BILATERAL SALPINGECTOMY;  Surgeon: Thurnell Lose, MD;  Location: [facility];  Service: Gynecology;  Laterality: Bilateral;  . CHOLECYSTECTOMY    . FOOT SURGERY      There were no vitals filed for this visit.     Pt report pain better and CTS with using wrist splint at night time - and ice over A1 pulley at thumb   Pt tender and with opposition pt show triggering every time  Report CT symptoms at night time - but wrist splint helps  Pt to wear band aid on thumb IP - to decrease triggering during day time - and off 2-3 x day to keep AROM at thumb IP   CMC neoprene splint to thumb to provided some support at MP of thumb - to decrease pain at A1pulley and  Triggering during gripping  Pt to modify activities - to use larger joints, builit up handles - use splint as needed and ice at A1 pulley   Will phone me next week                                   Patient will benefit from skilled therapeutic intervention in order to  improve the following deficits and impairments:     Visit Diagnosis: No diagnosis found.    Problem List Patient Active Problem List   Diagnosis Date Noted  . S/P TAH (total abdominal hysterectomy) 03/10/2015  . Vasovagal syncope 07/07/2014  . Essential hypertension 07/07/2014  . Hyperlipidemia 07/07/2014    Rosalyn Gess OTR/L<CLT  11/15/2015, 9:15 PM  [facility] [address], [name], [address] Phone: [phone]   Fax:  [phone]  Name: Heather Rios MRN: [id number] Date of Birth: 1965/12/27

## 2015-12-07 DIAGNOSIS — J0100 Acute maxillary sinusitis, unspecified: Secondary | ICD-10-CM | POA: Diagnosis not present

## 2015-12-20 DIAGNOSIS — H5203 Hypermetropia, bilateral: Secondary | ICD-10-CM | POA: Diagnosis not present

## 2015-12-27 DIAGNOSIS — H60542 Acute eczematoid otitis externa, left ear: Secondary | ICD-10-CM | POA: Diagnosis not present

## 2016-01-18 DIAGNOSIS — R011 Cardiac murmur, unspecified: Secondary | ICD-10-CM | POA: Diagnosis not present

## 2016-01-18 DIAGNOSIS — R350 Frequency of micturition: Secondary | ICD-10-CM | POA: Diagnosis not present

## 2016-01-18 DIAGNOSIS — R002 Palpitations: Secondary | ICD-10-CM | POA: Diagnosis not present

## 2016-02-08 MED FILL — DEXILANT DR 60 MG CAPSULE: 60 | 30 days supply | Qty: 30 | Fill #0 | Status: TO

## 2016-03-06 ENCOUNTER — Encounter: Payer: Self-pay | Admitting: Gastroenterology

## 2016-03-16 ENCOUNTER — Encounter: Payer: Self-pay | Admitting: Physician Assistant

## 2016-03-16 ENCOUNTER — Ambulatory Visit: Payer: Self-pay | Admitting: Physician Assistant

## 2016-03-16 VITALS — BP 119/70 | HR 74 | Temp 97.5°F

## 2016-03-16 DIAGNOSIS — J0120 Acute ethmoidal sinusitis, unspecified: Secondary | ICD-10-CM

## 2016-03-16 MED ORDER — HYDROXYZINE HCL 50 MG PO TABS: 50.0000 mg | ORAL_TABLET | Freq: Three times a day (TID) | ORAL | 0 refills | Status: DC | PRN

## 2016-03-16 MED ORDER — METHYLPREDNISOLONE 4 MG PO TBPK: ORAL_TABLET | ORAL | 0 refills | Status: DC

## 2016-03-16 MED ORDER — FLUCONAZOLE 150 MG PO TABS: 150.0000 mg | ORAL_TABLET | Freq: Once | ORAL | 0 refills | Status: AC

## 2016-03-16 MED ORDER — SULFAMETHOXAZOLE-TRIMETHOPRIM 800-160 MG PO TABS: 1.0000 | ORAL_TABLET | Freq: Two times a day (BID) | ORAL | 0 refills | Status: DC

## 2016-03-16 NOTE — Progress Notes (Signed)
   Subjective:sinus congestion    Patient ID: Heather Rios, female    DOB: 1966-02-05, 50 y.o.   MRN: [id number]  HPI Patient c/o sinusitis congestion, frontal headache, and post nasal drainage for 4-5 weeks. No relief with OTC medications. Denies fever/chill, or N/V/D.    Review of Systems Negative except for compliant.    Objective:   Physical Exam Bilateral maxillary guarding, edematous nasal turbinates and post nasal drainage. Neck supple without adenopathy, Lungs CTA, and Heart RRR.       Assessment & Plan:Sinusitis  Bactrim DS, Atarax, Medrol Dose pack, and Diflucan. Follow up with Family Doctor.

## 2016-04-12 ENCOUNTER — Ambulatory Visit (AMBULATORY_SURGERY_CENTER): Payer: Self-pay

## 2016-04-12 ENCOUNTER — Telehealth: Payer: Self-pay

## 2016-04-12 VITALS — Ht 61.0 in | Wt 151.0 lb

## 2016-04-12 DIAGNOSIS — Z1211 Encounter for screening for malignant neoplasm of colon: Secondary | ICD-10-CM

## 2016-04-12 MED ORDER — SUPREP BOWEL PREP KIT 17.5-3.13-1.6 GM/177ML PO SOLN: 1.0000 | Freq: Once | ORAL | 0 refills | Status: AC

## 2016-04-12 NOTE — Telephone Encounter (Signed)
Just FYI pt is allergic to eggs-hives, itching Thanks,                Levada Dy

## 2016-04-12 NOTE — Progress Notes (Signed)
Allergic to eggs hives and itching No diet meds No home oxygen\ No past problems with anesthesia  Registered for emmi

## 2016-04-17 DIAGNOSIS — R21 Rash and other nonspecific skin eruption: Secondary | ICD-10-CM | POA: Diagnosis not present

## 2016-04-19 ENCOUNTER — Ambulatory Visit (AMBULATORY_SURGERY_CENTER): Payer: 59 | Admitting: Gastroenterology

## 2016-04-19 ENCOUNTER — Encounter: Payer: Self-pay | Admitting: Gastroenterology

## 2016-04-19 VITALS — BP 124/74 | HR 58 | Temp 96.8°F | Resp 14 | Ht 61.0 in | Wt 151.0 lb

## 2016-04-19 DIAGNOSIS — Z1212 Encounter for screening for malignant neoplasm of rectum: Secondary | ICD-10-CM | POA: Diagnosis not present

## 2016-04-19 DIAGNOSIS — D128 Benign neoplasm of rectum: Secondary | ICD-10-CM

## 2016-04-19 DIAGNOSIS — D123 Benign neoplasm of transverse colon: Secondary | ICD-10-CM

## 2016-04-19 DIAGNOSIS — Z1211 Encounter for screening for malignant neoplasm of colon: Secondary | ICD-10-CM | POA: Diagnosis present

## 2016-04-19 DIAGNOSIS — K219 Gastro-esophageal reflux disease without esophagitis: Secondary | ICD-10-CM | POA: Diagnosis not present

## 2016-04-19 DIAGNOSIS — I10 Essential (primary) hypertension: Secondary | ICD-10-CM | POA: Diagnosis not present

## 2016-04-19 DIAGNOSIS — K621 Rectal polyp: Secondary | ICD-10-CM

## 2016-04-19 DIAGNOSIS — E669 Obesity, unspecified: Secondary | ICD-10-CM | POA: Diagnosis not present

## 2016-04-19 DIAGNOSIS — K653 Choleperitonitis: Secondary | ICD-10-CM | POA: Diagnosis not present

## 2016-04-19 MED ORDER — SODIUM CHLORIDE 0.9 % IV SOLN: 500.0000 mL | INTRAVENOUS | Status: DC

## 2016-04-19 NOTE — Progress Notes (Signed)
Report to PACU, RN, vss, BBS= Clear.  

## 2016-04-19 NOTE — Progress Notes (Signed)
Called to room to assist during endoscopic procedure.  Patient ID and intended procedure confirmed with present staff. Received instructions for my participation in the procedure from the performing physician.  

## 2016-04-19 NOTE — Patient Instructions (Signed)
YOU HAD AN ENDOSCOPIC PROCEDURE TODAY AT THE [facility]:   Refer to the procedure report that was given to you for any specific questions about what was found during the examination.  If the procedure report does not answer your questions, please call your gastroenterologist to clarify.  If you requested that your care partner not be given the details of your procedure findings, then the procedure report has been included in a sealed envelope for you to review at your convenience later.  YOU SHOULD EXPECT: Some feelings of bloating in the abdomen. Passage of more gas than usual.  Walking can help get rid of the air that was put into your GI tract during the procedure and reduce the bloating. If you had a lower endoscopy (such as a colonoscopy or flexible sigmoidoscopy) you may notice spotting of blood in your stool or on the toilet paper. If you underwent a bowel prep for your procedure, you may not have a normal bowel movement for a few days.  Please Note:  You might notice some irritation and congestion in your nose or some drainage.  This is from the oxygen used during your procedure.  There is no need for concern and it should clear up in a day or so.  SYMPTOMS TO REPORT IMMEDIATELY:   Following lower endoscopy (colonoscopy or flexible sigmoidoscopy):  Excessive amounts of blood in the stool  Significant tenderness or worsening of abdominal pains  Swelling of the abdomen that is new, acute  Fever of 100F or higher    For urgent or emergent issues, a gastroenterologist can be reached at any hour by calling [phone].   DIET:  We do recommend a small meal at first, but then you may proceed to your regular diet.  Drink plenty of fluids but you should avoid alcoholic beverages for 24 hours.  ACTIVITY:  You should plan to take it easy for the rest of today and you should NOT DRIVE or use heavy machinery until tomorrow (because of the sedation medicines used during the test).     FOLLOW UP: Our staff will call the number listed on your records the next business day following your procedure to check on you and address any questions or concerns that you may have regarding the information given to you following your procedure. If we do not reach you, we will leave a message.  However, if you are feeling well and you are not experiencing any problems, there is no need to return our call.  We will assume that you have returned to your regular daily activities without incident.  If any biopsies were taken you will be contacted by phone or by letter within the next 1-3 weeks.  Please call us at [phone] if you have not heard about the biopsies in 3 weeks.    SIGNATURES/CONFIDENTIALITY: You and/or your care partner have signed paperwork which will be entered into your electronic medical record.  These signatures attest to the fact that that the information above on your After Visit Summary has been reviewed and is understood.  Full responsibility of the confidentiality of this discharge information lies with you and/or your care-partner.   INFORMATION ON POLYPS  AND DIVERTICULOSIS GIVEN TO YOU TODAY 

## 2016-04-19 NOTE — Op Note (Signed)
[place] Patient Name: Heather Rios Procedure Date: 04/19/2016 10:30 AM MRN: [id number] Endoscopist: Mauri Pole , MD Age: 51 Referring MD:  Date of Birth: Apr 03, 1965 Gender: Female Account #: [id number] Procedure:                Colonoscopy Indications:              Screening for colorectal malignant neoplasm, This                            is the patient's first colonoscopy Medicines:                Monitored Anesthesia Care Procedure:                Pre-Anesthesia Assessment:                           - Prior to the procedure, a History and Physical                            was performed, and patient medications and                            allergies were reviewed. The patient's tolerance of                            previous anesthesia was also reviewed. The risks                            and benefits of the procedure and the sedation                            options and risks were discussed with the patient.                            All questions were answered, and informed consent                            was obtained. Prior Anticoagulants: The patient has                            taken no previous anticoagulant or antiplatelet                            agents. ASA Grade Assessment: II - A patient with                            mild systemic disease. After reviewing the risks                            and benefits, the patient was deemed in                            satisfactory condition to undergo the procedure.  After obtaining informed consent, the colonoscope                            was passed under direct vision. Throughout the                            procedure, the patient's blood pressure, pulse, and                            oxygen saturations were monitored continuously. The                            Model CF-HQ190L [id number]) scope was introduced                            through the anus and  advanced to the the terminal                            ileum, with identification of the appendiceal                            orifice and IC valve. The colonoscopy was performed                            without difficulty. The patient tolerated the                            procedure well. The quality of the bowel                            preparation was excellent. The terminal ileum,                            ileocecal valve, appendiceal orifice, and rectum                            were photographed. Scope In: 10:37:26 AM Scope Out: 10:55:47 AM Scope Withdrawal Time: 0 hours 14 minutes 29 seconds  Total Procedure Duration: 0 hours 18 minutes 21 seconds  Findings:                 The perianal and digital rectal examinations were                            normal.                           A 6 mm polyp was found in the hepatic flexure. The                            polyp was sessile. The polyp was removed with a                            cold snare. Resection and retrieval were complete.  A 1 mm polyp was found in the rectum. The polyp was                            sessile. The polyp was removed with a cold biopsy                            forceps. Resection and retrieval were complete.                           A few small-mouthed diverticula were found in the                            ascending colon.                           The exam was otherwise without abnormality. Complications:            No immediate complications. Estimated Blood Loss:     Estimated blood loss was minimal. Impression:               - One 6 mm polyp at the hepatic flexure, removed                            with a cold snare. Resected and retrieved.                           - One 1 mm polyp in the rectum, removed with a cold                            biopsy forceps. Resected and retrieved.                           - Diverticulosis in the ascending colon.                            - The examination was otherwise normal. Recommendation:           - Patient has a contact number available for                            emergencies. The signs and symptoms of potential                            delayed complications were discussed with the                            patient. Return to normal activities tomorrow.                            Written discharge instructions were provided to the                            patient.                           -  Resume previous diet.                           - Continue present medications.                           - Await pathology results.                           - Repeat colonoscopy in 5-10 years for surveillance                            based on pathology results. Mauri Pole, MD 04/19/2016 11:01:12 AM This report has been signed electronically.

## 2016-04-20 ENCOUNTER — Telehealth: Payer: Self-pay

## 2016-04-20 NOTE — Telephone Encounter (Signed)
  Follow up Call-  Call back number 04/19/2016  Post procedure Call Back phone  # [phone]  Permission to leave phone message Yes  Some recent data might be hidden     Patient questions:  Do you have a fever, pain , or abdominal swelling? No. Pain Score  0 *  Have you tolerated food without any problems? Yes.    Have you been able to return to your normal activities? Yes.    Do you have any questions about your discharge instructions: Diet   No. Medications  No. Follow up visit  No.  Do you have questions or concerns about your Care? No.  Actions: * If pain score is 4 or above: No action needed, pain <4.

## 2016-04-24 DIAGNOSIS — K219 Gastro-esophageal reflux disease without esophagitis: Secondary | ICD-10-CM | POA: Diagnosis not present

## 2016-04-24 DIAGNOSIS — E7800 Pure hypercholesterolemia, unspecified: Secondary | ICD-10-CM | POA: Diagnosis not present

## 2016-04-24 DIAGNOSIS — G5602 Carpal tunnel syndrome, left upper limb: Secondary | ICD-10-CM | POA: Diagnosis not present

## 2016-04-24 DIAGNOSIS — L309 Dermatitis, unspecified: Secondary | ICD-10-CM | POA: Diagnosis not present

## 2016-04-24 DIAGNOSIS — I10 Essential (primary) hypertension: Secondary | ICD-10-CM | POA: Diagnosis not present

## 2016-04-24 DIAGNOSIS — J309 Allergic rhinitis, unspecified: Secondary | ICD-10-CM | POA: Diagnosis not present

## 2016-04-26 ENCOUNTER — Encounter: Payer: Self-pay | Admitting: Gastroenterology

## 2016-04-27 ENCOUNTER — Ambulatory Visit: Payer: Self-pay | Admitting: Physician Assistant

## 2016-04-27 DIAGNOSIS — L705 Acne excoriee des jeunes filles: Secondary | ICD-10-CM

## 2016-04-27 MED ORDER — MUPIROCIN 2 % EX OINT: 1.0000 "application " | TOPICAL_OINTMENT | Freq: Two times a day (BID) | CUTANEOUS | 0 refills | Status: DC

## 2016-04-27 NOTE — Progress Notes (Signed)
S: c/o pimple on face that won't go away, last time this happened she used bactroban ointment and it cleared up, no fever/chills, no cough or congestion, sx for over a month, using proactive  O: vitals wnl, nad, skin with small pimple on left cheek, area is dark around pus, neck supple no lymph, lungs c t a, cv rrr  A: acne  P: bactroban ointment, cetaphil face wash and lotion

## 2016-05-31 ENCOUNTER — Other Ambulatory Visit: Payer: Self-pay | Admitting: Obstetrics and Gynecology

## 2016-05-31 DIAGNOSIS — Z01411 Encounter for gynecological examination (general) (routine) with abnormal findings: Secondary | ICD-10-CM | POA: Diagnosis not present

## 2016-05-31 DIAGNOSIS — E010 Iodine-deficiency related diffuse (endemic) goiter: Secondary | ICD-10-CM | POA: Diagnosis not present

## 2016-06-01 ENCOUNTER — Other Ambulatory Visit: Payer: Self-pay | Admitting: Family Medicine

## 2016-06-01 DIAGNOSIS — Z1231 Encounter for screening mammogram for malignant neoplasm of breast: Secondary | ICD-10-CM

## 2016-06-07 ENCOUNTER — Ambulatory Visit
Admission: RE | Admit: 2016-06-07 | Discharge: 2016-06-07 | Disposition: A | Discharge status: Home / Self Care | Payer: 59 | Source: Ambulatory Visit | Attending: Obstetrics and Gynecology | Admitting: Obstetrics and Gynecology

## 2016-06-07 DIAGNOSIS — E010 Iodine-deficiency related diffuse (endemic) goiter: Secondary | ICD-10-CM

## 2016-06-07 DIAGNOSIS — E042 Nontoxic multinodular goiter: Secondary | ICD-10-CM | POA: Diagnosis not present

## 2016-06-14 MED FILL — PANTOPRAZOLE SOD DR 40 MG T: 40 | 90 days supply | Qty: 180 | Fill #0

## 2016-06-20 MED FILL — PRAVASTATIN NA 40 MG TAB: 40 | 90 days supply | Qty: 90 | Fill #0

## 2016-07-23 DIAGNOSIS — H578 Other specified disorders of eye and adnexa: Secondary | ICD-10-CM | POA: Diagnosis not present

## 2016-07-23 DIAGNOSIS — R634 Abnormal weight loss: Secondary | ICD-10-CM | POA: Diagnosis not present

## 2016-07-23 DIAGNOSIS — L708 Other acne: Secondary | ICD-10-CM | POA: Diagnosis not present

## 2016-07-23 MED FILL — AZELASTINE HCL 0.05% DROPS: 0.05 | 30 days supply | Qty: 6 | Fill #0

## 2016-07-23 MED FILL — BENZOYL PEROXIDE 10% WASH: 10 | 25 days supply | Qty: 148 | Fill #0

## 2016-08-06 ENCOUNTER — Ambulatory Visit
Admission: RE | Admit: 2016-08-06 | Discharge: 2016-08-06 | Disposition: A | Discharge status: Home / Self Care | Payer: 59 | Source: Ambulatory Visit | Attending: Family Medicine | Admitting: Family Medicine

## 2016-08-06 DIAGNOSIS — Z1231 Encounter for screening mammogram for malignant neoplasm of breast: Secondary | ICD-10-CM | POA: Insufficient documentation

## 2016-08-07 DIAGNOSIS — J309 Allergic rhinitis, unspecified: Secondary | ICD-10-CM | POA: Diagnosis not present

## 2016-08-07 DIAGNOSIS — Z91012 Allergy to eggs: Secondary | ICD-10-CM | POA: Diagnosis not present

## 2016-08-07 DIAGNOSIS — H1013 Acute atopic conjunctivitis, bilateral: Secondary | ICD-10-CM | POA: Diagnosis not present

## 2016-08-09 ENCOUNTER — Ambulatory Visit (INDEPENDENT_AMBULATORY_CARE_PROVIDER_SITE_OTHER): Payer: 59 | Admitting: Podiatry

## 2016-08-09 ENCOUNTER — Encounter: Payer: Self-pay | Admitting: Podiatry

## 2016-08-09 ENCOUNTER — Ambulatory Visit (INDEPENDENT_AMBULATORY_CARE_PROVIDER_SITE_OTHER): Payer: 59

## 2016-08-09 DIAGNOSIS — M79671 Pain in right foot: Secondary | ICD-10-CM | POA: Diagnosis not present

## 2016-08-09 DIAGNOSIS — B079 Viral wart, unspecified: Secondary | ICD-10-CM | POA: Diagnosis not present

## 2016-08-09 NOTE — Progress Notes (Signed)
Subjective:    Patient ID: Heather Rios, female   DOB: 51 y.o.   MRN: [id number]   HPI patient points the bottom of the foot stating that there is been something there that's very tender when pressed and feels like she's walking on a not. States it's been present for a little while and worse over the last week    ROS      Objective:  Physical Exam Neurovascular status intact muscle strength adequate with lesion plantar right that upon debridement shows pinpoint bleeding and seems to be more painful to lateral pressure    Assessment:   Verruca plantaris most likely plantar right      Plan:    Condition reviewed and recommended excision explaining risk. Patient wants this done and does not want pathology for the cost and I did go ahead today and I infiltrated 60 mg Xylocaine with epinephrine and under sterile conditions I excised the lesion and it appears to be very routine with no irritation of underlying tissue and I did apply sterile dressing to the area and instructed it may regrow and I instructed on soaks and reappoint

## 2016-08-15 DIAGNOSIS — M79676 Pain in unspecified toe(s): Secondary | ICD-10-CM

## 2016-08-16 MED FILL — OLOPATADINE HCL 0.2 % SOLN: 0.2 | 25 days supply | Qty: 3 | Fill #0

## 2016-08-20 ENCOUNTER — Telehealth: Payer: Self-pay | Admitting: *Deleted

## 2016-08-20 NOTE — Telephone Encounter (Addendum)
Pt wants to know the status of the [employer] paperwork.08/21/2016-Pt called for status of the [employer] paper work.

## 2016-09-03 ENCOUNTER — Encounter: Payer: Self-pay | Admitting: Physician Assistant

## 2016-09-03 ENCOUNTER — Ambulatory Visit: Payer: Self-pay | Admitting: Physician Assistant

## 2016-09-03 VITALS — BP 142/75 | HR 75 | Temp 98.5°F | Resp 16

## 2016-09-03 DIAGNOSIS — L709 Acne, unspecified: Secondary | ICD-10-CM

## 2016-09-03 DIAGNOSIS — J0100 Acute maxillary sinusitis, unspecified: Secondary | ICD-10-CM

## 2016-09-03 MED ORDER — PREDNISONE 10 MG PO TABS: 30.0000 mg | ORAL_TABLET | Freq: Every day | ORAL | 0 refills | Status: DC

## 2016-09-03 MED ORDER — FLUCONAZOLE 150 MG PO TABS: 150.0000 mg | ORAL_TABLET | ORAL | 0 refills | Status: DC | PRN

## 2016-09-03 MED ORDER — DOXYCYCLINE HYCLATE 100 MG PO TABS: 100.0000 mg | ORAL_TABLET | Freq: Every day | ORAL | 12 refills | Status: DC

## 2016-09-03 NOTE — Progress Notes (Signed)
S: C/o runny nose and congestion for 3 days, no fever, chills, cp/sob, v/d; mucus is green and thick, cough is sporadic, c/o of facial and dental pain. 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

## 2016-09-17 MED FILL — PANTOPRAZOLE SOD DR 40 MG T: 40 | 90 days supply | Qty: 180 | Fill #1

## 2016-09-17 MED FILL — PRAVASTATIN NA 40 MG TAB: 40 | 90 days supply | Qty: 90 | Fill #1

## 2016-09-24 DIAGNOSIS — M79672 Pain in left foot: Secondary | ICD-10-CM | POA: Diagnosis not present

## 2016-09-24 MED FILL — NAPROXEN 500 MG TABLET: 500 | 10 days supply | Qty: 20 | Fill #0

## 2016-10-03 ENCOUNTER — Other Ambulatory Visit: Payer: Self-pay

## 2016-10-03 ENCOUNTER — Ambulatory Visit (INDEPENDENT_AMBULATORY_CARE_PROVIDER_SITE_OTHER): Payer: 59 | Admitting: Podiatry

## 2016-10-03 ENCOUNTER — Ambulatory Visit (INDEPENDENT_AMBULATORY_CARE_PROVIDER_SITE_OTHER): Payer: 59

## 2016-10-03 ENCOUNTER — Ambulatory Visit: Payer: 59

## 2016-10-03 ENCOUNTER — Encounter: Payer: Self-pay | Admitting: Podiatry

## 2016-10-03 DIAGNOSIS — M722 Plantar fascial fibromatosis: Secondary | ICD-10-CM | POA: Diagnosis not present

## 2016-10-03 DIAGNOSIS — M7672 Peroneal tendinitis, left leg: Secondary | ICD-10-CM

## 2016-10-03 DIAGNOSIS — M79672 Pain in left foot: Secondary | ICD-10-CM | POA: Diagnosis not present

## 2016-10-03 MED ORDER — BETAMETHASONE SOD PHOS & ACET 6 (3-3) MG/ML IJ SUSP: 3.0000 mg | Freq: Once | INTRAMUSCULAR | Status: DC

## 2016-10-03 MED ORDER — DICLOFENAC SODIUM 75 MG PO TBEC: 75.0000 mg | DELAYED_RELEASE_TABLET | Freq: Two times a day (BID) | ORAL | 1 refills | Status: DC

## 2016-10-03 MED ORDER — METHYLPREDNISOLONE 4 MG PO TBPK: ORAL_TABLET | ORAL | 0 refills | Status: DC

## 2016-10-03 MED ORDER — METHYLPREDNISOLONE 4 MG PO TBPK
ORAL_TABLET | ORAL | 0 refills | Status: DC
Start: 2016-10-03 — End: 2016-10-03

## 2016-10-03 MED FILL — DICLOFENAC SOD 75 MG TAB EC: 75 | 30 days supply | Qty: 60 | Fill #0

## 2016-10-03 MED FILL — METHYLPREDNISOLONE 4 MG TAB: 4 | 6 days supply | Qty: 21 | Fill #0

## 2016-10-03 NOTE — Progress Notes (Signed)
   Subjective: Patient presents today with a new complaint of heel pain to the left lower extremity is been going on for approximately 2 weeks now. She states the pain radiates down to her toes of her leg. She states that the pain is also an outside of her foot. There are no alleviating factors. Patient presents today for further treatment and evaluation  Objective: Physical Exam General: The patient is alert and oriented x3 in no acute distress.  Dermatology: Skin is warm, dry and supple bilateral lower extremities. Negative for open lesions or macerations bilateral.   Vascular: Dorsalis Pedis and Posterior Tibial pulses palpable bilateral.  Capillary fill time is immediate to all digits.  Neurological: Epicritic and protective threshold intact bilateral.   Musculoskeletal: Tenderness to palpation at the medial calcaneal tubercale and through the insertion of the plantar fascia of the left foot. All other joints range of motion within normal limits bilateral. Strength 5/5 in all groups bilateral. Pain on palpation also noted to the insertion of the peroneal tendon into the fifth metatarsal tubercle. Pain along the peroneal tendon noted as well.   Radiographic exam:  Normal osseous mineralization. Joint spaces preserved. No fracture/dislocation/boney destruction. Calcaneal spur present with mild thickening of plantar fascia left. No other soft tissue abnormalities or radiopaque foreign bodies.   Assessment: 1. Plantar fasciitis left foot 2. Insertional peroneal tendinitis left  Plan of Care:  1. Patient evaluated. Xrays reviewed.   2. Injection of 0.5cc Celestone soluspan injected into the left plantar fascia.  3. Rx for Medrol Dose Pak placed 4. Rx for Diclofenac 75mg  PO BID ordered for patient. 4. Plantar fascial band(s) dispensed  5. Instructed patient regarding therapies and modalities at home to alleviate symptoms.  6. Injection of 0.5 mL Celestone Soluspan injected in the  peroneal tendon sheath at the insertion of the fifth metatarsal tubercle. Care was taken to avoid direct injection into the tendon 7. Today the patient was scanned for custom molded orthotics 8. 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 Edrick Kins, DPM    [address],  [address]                Office [phone]  Fax [phone]

## 2016-10-10 ENCOUNTER — Telehealth: Payer: Self-pay | Admitting: Podiatry

## 2016-10-10 NOTE — Telephone Encounter (Signed)
Left message informing pt celestone was injected and occasionally there are a flare of symptoms, begin ice 3-4 times 15-20 minutes per session protecting the skin from the ice pack, rest and take the steroid dose pack as directed and then the antiinflammatory. I forgot to tell pt to call for an appt in 4 weeks, I called and left message with appt line numbers.

## 2016-10-10 NOTE — Telephone Encounter (Signed)
I saw Dr. Amalia Hailey in place of Dr. Paulla Dolly last Wednesday and received injections. I was wanting to know what medication was in the injection because it did not help and I'm still having pain. I'm also having pain into my knee and my knee is swelling some. He did tell me I needed to come back in 4 weeks but I don't know if any appointment was made because I did not stop at the check out desk.

## 2016-10-22 ENCOUNTER — Telehealth: Payer: Self-pay | Admitting: Podiatry

## 2016-10-22 MED FILL — DOXYCYCLINE HYCLATE 100 MG: 100 | 30 days supply | Qty: 30 | Fill #0

## 2016-10-22 NOTE — Telephone Encounter (Signed)
lvm for pt to call to schedule an appt to [facility] in.

## 2016-11-05 DIAGNOSIS — G5602 Carpal tunnel syndrome, left upper limb: Secondary | ICD-10-CM | POA: Diagnosis not present

## 2016-11-05 DIAGNOSIS — E7800 Pure hypercholesterolemia, unspecified: Secondary | ICD-10-CM | POA: Diagnosis not present

## 2016-11-05 DIAGNOSIS — K219 Gastro-esophageal reflux disease without esophagitis: Secondary | ICD-10-CM | POA: Diagnosis not present

## 2016-11-05 DIAGNOSIS — L309 Dermatitis, unspecified: Secondary | ICD-10-CM | POA: Diagnosis not present

## 2016-11-05 DIAGNOSIS — Z0000 Encounter for general adult medical examination without abnormal findings: Secondary | ICD-10-CM | POA: Diagnosis not present

## 2016-11-05 DIAGNOSIS — M722 Plantar fascial fibromatosis: Secondary | ICD-10-CM | POA: Diagnosis not present

## 2016-11-05 DIAGNOSIS — J309 Allergic rhinitis, unspecified: Secondary | ICD-10-CM | POA: Diagnosis not present

## 2016-11-05 DIAGNOSIS — B002 Herpesviral gingivostomatitis and pharyngotonsillitis: Secondary | ICD-10-CM | POA: Diagnosis not present

## 2016-11-05 DIAGNOSIS — I10 Essential (primary) hypertension: Secondary | ICD-10-CM | POA: Diagnosis not present

## 2016-11-05 MED FILL — valACYclovir HCL 1 GM TABS: 1 | 4 days supply | Qty: 8 | Fill #0

## 2016-11-05 MED FILL — IRBESARTAN 300 MG TABLET: 300 | 90 days supply | Qty: 90 | Fill #0

## 2016-12-03 MED FILL — DOXYCYCLINE HYCLATE 100 MG: 100 | 30 days supply | Qty: 30 | Fill #1

## 2016-12-06 DIAGNOSIS — L700 Acne vulgaris: Secondary | ICD-10-CM | POA: Diagnosis not present

## 2016-12-06 DIAGNOSIS — J0100 Acute maxillary sinusitis, unspecified: Secondary | ICD-10-CM | POA: Diagnosis not present

## 2016-12-06 MED FILL — FLUCONAZOLE 150 MG TABLET: 150 | 1 days supply | Qty: 1 | Fill #0

## 2016-12-06 MED FILL — DOXYCYCLINE MONO 100 MG TAB: 100 | 10 days supply | Qty: 20 | Fill #0

## 2016-12-10 MED FILL — valACYclovir HCL 1 GM TABS: 1 | 4 days supply | Qty: 8 | Fill #1

## 2016-12-19 DIAGNOSIS — H5203 Hypermetropia, bilateral: Secondary | ICD-10-CM | POA: Diagnosis not present

## 2016-12-19 MED FILL — PRAVASTATIN NA 40 MG TAB: 40 | 90 days supply | Qty: 90 | Fill #0

## 2016-12-19 MED FILL — NAPROXEN 500 MG TABLET: 500 | 90 days supply | Qty: 180 | Fill #0

## 2016-12-19 MED FILL — PANTOPRAZOLE SOD DR 40 MG T: 40 | 90 days supply | Qty: 180 | Fill #2

## 2016-12-20 MED FILL — PREVIDENT 5000 SENSITIVE PA: 1.1-5 | 20 days supply | Qty: 100 | Fill #0

## 2016-12-21 MED FILL — BENZOYL PEROXIDE 10% WASH: 10 | 25 days supply | Qty: 148 | Fill #1

## 2016-12-24 MED FILL — ADAPALENE 0.1% GEL: 0.1 | 30 days supply | Qty: 45 | Fill #0

## 2016-12-31 MED FILL — valACYclovir HCL 1 GM TABS: 1 | 2 days supply | Qty: 8 | Fill #0

## 2017-01-09 DIAGNOSIS — R0789 Other chest pain: Secondary | ICD-10-CM | POA: Diagnosis not present

## 2017-01-09 DIAGNOSIS — F419 Anxiety disorder, unspecified: Secondary | ICD-10-CM | POA: Diagnosis not present

## 2017-01-09 DIAGNOSIS — F4321 Adjustment disorder with depressed mood: Secondary | ICD-10-CM | POA: Diagnosis not present

## 2017-01-09 MED FILL — ALPRAZolam 0.25 MG TABS: 0.25 | 30 days supply | Qty: 10 | Fill #0

## 2017-01-26 DIAGNOSIS — R1031 Right lower quadrant pain: Secondary | ICD-10-CM | POA: Diagnosis not present

## 2017-01-26 DIAGNOSIS — N390 Urinary tract infection, site not specified: Secondary | ICD-10-CM | POA: Diagnosis not present

## 2017-02-04 MED FILL — IRBESARTAN 300 MG TABLET: 300 | 90 days supply | Qty: 90 | Fill #1

## 2017-03-05 DIAGNOSIS — R0789 Other chest pain: Secondary | ICD-10-CM | POA: Diagnosis not present

## 2017-03-05 DIAGNOSIS — K219 Gastro-esophageal reflux disease without esophagitis: Secondary | ICD-10-CM | POA: Diagnosis not present

## 2017-03-05 DIAGNOSIS — F419 Anxiety disorder, unspecified: Secondary | ICD-10-CM | POA: Diagnosis not present

## 2017-03-05 MED FILL — ESCITALOPRAM 10 MG TABLET: 10 | 30 days supply | Qty: 30 | Fill #0

## 2017-03-05 MED FILL — raNITIdine HCL 150 MG TABS: 150 | 90 days supply | Qty: 90 | Fill #0

## 2017-03-11 MED FILL — valACYclovir HCL 1 GM TABS: 1 | 2 days supply | Qty: 8 | Fill #1

## 2017-03-11 MED FILL — MONTELUKAST SOD 10 MG TAB: 10 | 30 days supply | Qty: 30 | Fill #0

## 2017-03-21 MED FILL — BENZOYL PEROXIDE 10% WASH: 10 | 25 days supply | Qty: 148 | Fill #2

## 2017-03-21 MED FILL — FLUTICASONE PROP 50 MCG SPR: 50 | 90 days supply | Qty: 48 | Fill #0

## 2017-03-25 DIAGNOSIS — M25569 Pain in unspecified knee: Secondary | ICD-10-CM | POA: Diagnosis not present

## 2017-03-25 DIAGNOSIS — J069 Acute upper respiratory infection, unspecified: Secondary | ICD-10-CM | POA: Diagnosis not present

## 2017-03-25 MED FILL — DICLOFENAC SODIUM 1% GEL: 1 | 6 days supply | Qty: 100 | Fill #0

## 2017-03-25 MED FILL — PROMETHAZINE-DM SYRUP: 6.25-15 | 5 days supply | Qty: 100 | Fill #0

## 2017-03-27 MED FILL — PANTOPRAZOLE SOD DR 40 MG T: 40 | 90 days supply | Qty: 180 | Fill #3

## 2017-03-27 MED FILL — PRAVASTATIN NA 40 MG TAB: 40 | 90 days supply | Qty: 90 | Fill #1

## 2017-04-04 ENCOUNTER — Ambulatory Visit (INDEPENDENT_AMBULATORY_CARE_PROVIDER_SITE_OTHER): Payer: Self-pay | Admitting: Family Medicine

## 2017-04-04 VITALS — BP 124/78 | HR 74 | Temp 98.5°F | Resp 18 | Wt 142.6 lb

## 2017-04-04 DIAGNOSIS — L2489 Irritant contact dermatitis due to other agents: Secondary | ICD-10-CM

## 2017-04-04 MED ORDER — BETAMETHASONE DIPROPIONATE 0.05 % EX CREA: TOPICAL_CREAM | Freq: Two times a day (BID) | CUTANEOUS | 0 refills | Status: AC

## 2017-04-04 MED FILL — BETAMETHASONE DP AUG 0.05%: 0.05 | 15 days supply | Qty: 50 | Fill #0

## 2017-04-04 NOTE — Progress Notes (Signed)
.   Subjective:     Heather Rios is a 52 y.o. female who presents for evaluation of a rash involving the left shoulder. Rash started 2 days ago. Rash is erythematous and macular with a  flat in texture. Rash has changed over time. Rash causes no discomfort. Associated symptoms: none. Patient denies: arthralgia, cough, fever, headache, irritability and myalgia. Patient has not had contacts with similar rash. Patient has had exposure to adhesive tape for which she reports an allergy.  I have reviewed the patient's medical history in detail; there are no changes to the history as noted in the electronic medical record.  ROS: See HPI Objective:  Physical Exam  General appearance: alert, cooperative and no distress Skin: erythema - shoulder left, macular rash with erythema  Assessment:  Irritant contact dermatitis due to other agent:adhesive tape and reports history of adhesive allergy  which once exposure was removed, rash improved.     Plan:  Topical steroid prescribed:   Meds ordered this encounter  Medications  . betamethasone dipropionate (DIPROLENE) 0.05 % cream    Sig: Apply topically 2 (two) times daily for 2 days.    Dispense:  60 g    Refill:  0    Carroll Sage. Kenton Kingfisher, MSN, FNP-C [address] # [place]  [place], [place] [address] [phone]

## 2017-04-04 NOTE — Patient Instructions (Signed)
I have betamethasone for you to apply to rash twice daily times 10 days.  If rash worsens or does not improve follow-up with your PCP or here.   Contact Dermatitis Dermatitis is redness, soreness, and swelling (inflammation) of the skin. Contact dermatitis is a reaction to certain substances that touch the skin. You either touched something that irritated your skin, or you have allergies to something you touched. Follow these instructions at home: Tolar your skin as needed.  Apply cool compresses to the affected areas.  Try taking a bath with: ? Epsom salts. Follow the instructions on the package. You can get these at a pharmacy or grocery store. ? Baking soda. Pour a small amount into the bath as told by your doctor. ? Colloidal oatmeal. Follow the instructions on the package. You can get this at a pharmacy or grocery store.  Try applying baking soda paste to your skin. Stir water into baking soda until it looks like paste.  Do not scratch your skin.  Bathe less often.  Bathe in lukewarm water. Avoid using hot water. Medicines  Take or apply over-the-counter and prescription medicines only as told by your doctor.  If you were prescribed an antibiotic medicine, take or apply your antibiotic as told by your doctor. Do not stop taking the antibiotic even if your condition starts to get better. General instructions  Keep all follow-up visits as told by your doctor. This is important.  Avoid the substance that caused your reaction. If you do not know what caused it, keep a journal to try to track what caused it. Write down: ? What you eat. ? What cosmetic products you use. ? What you drink. ? What you wear in the affected area. This includes jewelry.  If you were given a bandage (dressing), take care of it as told by your doctor. This includes when to change and remove it. Contact a doctor if:  You do not get better with treatment.  Your condition gets  worse.  You have signs of infection such as: ? Swelling. ? Tenderness. ? Redness. ? Soreness. ? Warmth.  You have a fever.  You have new symptoms. Get help right away if:  You have a very bad headache.  You have neck pain.  Your neck is stiff.  You throw up (vomit).  You feel very sleepy.  You see red streaks coming from the affected area.  Your bone or joint underneath the affected area becomes painful after the skin has healed.  The affected area turns darker.  You have trouble breathing. This information is not intended to replace advice given to you by your health care provider. Make sure you discuss any questions you have with your health care provider. Document Released: 12/31/2008 Document Revised: 08/11/2015 Document Reviewed: 07/21/2014 Elsevier Interactive Patient Education  2018 [employer].

## 2017-04-15 MED FILL — valACYclovir HCL 1 GM TABS: 1 | 2 days supply | Qty: 8 | Fill #2

## 2017-04-23 DIAGNOSIS — Z23 Encounter for immunization: Secondary | ICD-10-CM | POA: Diagnosis not present

## 2017-04-23 DIAGNOSIS — L700 Acne vulgaris: Secondary | ICD-10-CM | POA: Diagnosis not present

## 2017-04-23 MED FILL — DOXYCYCLINE HYCLATE 100 MG: 100 | 30 days supply | Qty: 30 | Fill #0

## 2017-04-23 MED FILL — FLUCONAZOLE 150 MG TABLET: 150 | 2 days supply | Qty: 2 | Fill #0

## 2017-04-25 MED FILL — IRBESARTAN 300 MG TABLET: 300 | 90 days supply | Qty: 90 | Fill #0

## 2017-05-01 MED FILL — valACYclovir HCL 1 GM TABS: 1 | 2 days supply | Qty: 8 | Fill #3

## 2017-05-08 DIAGNOSIS — J309 Allergic rhinitis, unspecified: Secondary | ICD-10-CM | POA: Diagnosis not present

## 2017-05-08 DIAGNOSIS — L309 Dermatitis, unspecified: Secondary | ICD-10-CM | POA: Diagnosis not present

## 2017-05-08 DIAGNOSIS — F419 Anxiety disorder, unspecified: Secondary | ICD-10-CM | POA: Diagnosis not present

## 2017-05-08 DIAGNOSIS — K219 Gastro-esophageal reflux disease without esophagitis: Secondary | ICD-10-CM | POA: Diagnosis not present

## 2017-05-08 DIAGNOSIS — I10 Essential (primary) hypertension: Secondary | ICD-10-CM | POA: Diagnosis not present

## 2017-05-08 DIAGNOSIS — R634 Abnormal weight loss: Secondary | ICD-10-CM | POA: Diagnosis not present

## 2017-05-08 DIAGNOSIS — E7800 Pure hypercholesterolemia, unspecified: Secondary | ICD-10-CM | POA: Diagnosis not present

## 2017-05-08 MED FILL — ESCITALOPRAM 10 MG TABLET: 10 | 90 days supply | Qty: 90 | Fill #0

## 2017-05-10 DIAGNOSIS — L700 Acne vulgaris: Secondary | ICD-10-CM | POA: Diagnosis not present

## 2017-05-10 MED FILL — CLIND PH-BENZOYL PEROX 1.2-: 1.2-5 | 30 days supply | Qty: 45 | Fill #0

## 2017-05-10 MED FILL — DOXYCYCLINE HYCLATE 100 MG: 100 | 30 days supply | Qty: 60 | Fill #0

## 2017-05-15 MED FILL — TRETINOIN 0.025% CREAM: 0.025 | 30 days supply | Qty: 45 | Fill #0

## 2017-05-27 MED FILL — AZELASTINE HCL 0.05% DROPS: 0.05 | 30 days supply | Qty: 6 | Fill #0

## 2017-06-04 DIAGNOSIS — F4321 Adjustment disorder with depressed mood: Secondary | ICD-10-CM | POA: Diagnosis not present

## 2017-06-04 DIAGNOSIS — Z01411 Encounter for gynecological examination (general) (routine) with abnormal findings: Secondary | ICD-10-CM | POA: Diagnosis not present

## 2017-06-04 DIAGNOSIS — N951 Menopausal and female climacteric states: Secondary | ICD-10-CM | POA: Diagnosis not present

## 2017-06-12 MED FILL — valACYclovir HCL 1 GM TABS: 1 | 2 days supply | Qty: 8 | Fill #4

## 2017-06-12 MED FILL — CYCLOBENZAPRINE 10 MG TAB: 10 | 30 days supply | Qty: 30 | Fill #0

## 2017-06-17 DIAGNOSIS — M79641 Pain in right hand: Secondary | ICD-10-CM | POA: Diagnosis not present

## 2017-06-17 DIAGNOSIS — M79642 Pain in left hand: Secondary | ICD-10-CM | POA: Diagnosis not present

## 2017-06-17 MED FILL — traMADol HCL 50 MG TABS: 50 | 30 days supply | Qty: 20 | Fill #0

## 2017-06-19 MED FILL — NEO/POLYMYXIN/HC EAR SOLN: 3.5-10000-1 | 13 days supply | Qty: 10 | Fill #0

## 2017-06-24 ENCOUNTER — Ambulatory Visit (INDEPENDENT_AMBULATORY_CARE_PROVIDER_SITE_OTHER): Payer: 59 | Admitting: Orthopaedic Surgery

## 2017-06-24 ENCOUNTER — Ambulatory Visit (INDEPENDENT_AMBULATORY_CARE_PROVIDER_SITE_OTHER): Payer: 59

## 2017-06-24 DIAGNOSIS — M79642 Pain in left hand: Secondary | ICD-10-CM | POA: Diagnosis not present

## 2017-06-24 DIAGNOSIS — M79641 Pain in right hand: Secondary | ICD-10-CM

## 2017-06-24 MED ORDER — CELECOXIB 200 MG PO CAPS: 200.0000 mg | ORAL_CAPSULE | Freq: Two times a day (BID) | ORAL | 3 refills | Status: DC

## 2017-06-24 MED ORDER — DICLOFENAC SODIUM 1 % TD GEL: 2.0000 g | Freq: Four times a day (QID) | TRANSDERMAL | 5 refills | Status: DC

## 2017-06-24 MED FILL — CELECOXIB 200 MG CAP: 200 | 15 days supply | Qty: 30 | Fill #0

## 2017-06-24 NOTE — Progress Notes (Signed)
Office Visit Note   Patient: SHAYRA ANTON           Date of Birth: [birth year]           MRN: [id number] Visit Date: 06/24/2017              Requested by: Antony Contras, MD [address] [place], [place] [address] PCP: Maury Dus, MD   Assessment & Plan: Visit Diagnoses:  1. 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  Nerve conduction studies ordered.  Carpal tunnel brace given for the right hand.  Prescription for Voltaren gel and Celebrex.  Follow-up to review nerve conduction studies  Follow-Up Instructions: Return if symptoms worsen or fail to improve.   Orders:  Orders Placed This Encounter  Procedures  . XR Hand Complete Left  . XR Hand Complete Right  . Ambulatory referral to Physical Medicine Rehab   Meds ordered this encounter  Medications  . diclofenac sodium (VOLTAREN) 1 % GEL    Sig: Apply 2 g topically 4 (four) times daily.    Dispense:  1 Tube    Refill:  5  . celecoxib (CELEBREX) 200 MG capsule    Sig: Take 1 capsule (200 mg total) by mouth 2 (two) times daily.    Dispense:  30 capsule    Refill:  3      Procedures: No procedures performed   Clinical Data: No additional findings.   Subjective: Chief Complaint  Patient presents with  . Right Hand - Pain  . Left Hand - Pain    Patient is a 52 year old female who has left greater than right hand pain and numbness and tingling.  She has soreness with use of her hand and making a fist.  She uses her hands constantly for work.  She uses tramadol and Biofreeze.  She sleeps with a carpal tunnel splint for her left hand which has helped.   Review of Systems  Constitutional: Negative.   HENT: Negative.   Eyes: Negative.   Respiratory: Negative.   Cardiovascular: Negative.   Endocrine: Negative.   Musculoskeletal: Negative.   Neurological: Negative.   Hematological: Negative.   Psychiatric/Behavioral:  Negative.   All other systems reviewed and are negative.    Objective: Vital Signs: LMP 01/10/2015 (Exact Date)   Physical Exam  Constitutional: She is oriented to person, place, and time. She appears well-developed and well-nourished.  HENT:  Head: Normocephalic and atraumatic.  Eyes: EOM are normal.  Neck: Neck supple.  Pulmonary/Chest: Effort normal.  Abdominal: Soft.  Neurological: She is alert and oriented to person, place, and time.  Skin: Skin is warm. Capillary refill takes less than 2 seconds.  Psychiatric: She has a normal mood and affect. Her behavior is normal. Judgment and thought content normal.  Nursing note and vitals reviewed.   Ortho Exam Bilateral hand exam shows stiffness with making a full composite fist.  She has positive carpal tunnel compressive signs.  No skin lesions.  Specialty Comments:  No specialty comments available.  Imaging: Xr Hand Complete Left  Result Date: 06/24/2017 Osteoarthritis of interphalangeal joints  Xr Hand Complete Right  Result Date: 06/24/2017 Osteoarthritis of interphalangeal joints    PMFS History: Patient Active Problem List   Diagnosis Date Noted  . S/P TAH (total abdominal hysterectomy) 03/10/2015  . Vasovagal syncope 07/07/2014  . Essential hypertension 07/07/2014  . Hyperlipidemia 07/07/2014   Past Medical History:  Diagnosis Date  . Allergic rhinitis   . Anemia   . Anginal pain ([place])    Chest Pains 8/16  . Eczema   . Environmental allergies   . GERD (gastroesophageal reflux disease)   . Heart murmur   . High cholesterol   . Hypertension   . Hypoglycemia   . Hypoglycemia   . Plantar fasciitis   . Sinusitis, chronic     Family History  Problem Relation Age of Onset  . Heart failure Mother   . Hyperlipidemia Mother   . Heart attack Mother 33  . Hyperlipidemia Father   . Kidney failure Father        kidney transplant receipiant  . Diabetes Unknown   . High blood pressure Unknown   . Breast  cancer Neg Hx   . Colon cancer Neg Hx   . Colon polyps Neg Hx   . Esophageal cancer Neg Hx   . Rectal cancer Neg Hx   . Stomach cancer Neg Hx     Past Surgical History:  Procedure Laterality Date  . ABDOMINAL HYSTERECTOMY N/A 03/10/2015   Procedure: HYSTERECTOMY ABDOMINAL;  Surgeon: Thurnell Lose, MD;  Location: [facility];  Service: Gynecology;  Laterality: N/A;  . BILATERAL SALPINGECTOMY Bilateral 03/10/2015   pt denies  . CHOLECYSTECTOMY    . FOOT SURGERY     Social History   Occupational History  . Not on file  Tobacco Use  . Smoking status: Never Smoker  . Smokeless tobacco: Never Used  Substance and Sexual Activity  . Alcohol use: No  . Drug use: No  . Sexual activity: Yes    Birth control/protection: Implant

## 2017-07-02 MED FILL — PRAVASTATIN NA 40 MG TAB: 40 | 90 days supply | Qty: 90 | Fill #0

## 2017-07-24 ENCOUNTER — Ambulatory Visit (INDEPENDENT_AMBULATORY_CARE_PROVIDER_SITE_OTHER): Payer: 59 | Admitting: Physical Medicine and Rehabilitation

## 2017-07-24 ENCOUNTER — Encounter (INDEPENDENT_AMBULATORY_CARE_PROVIDER_SITE_OTHER): Payer: Self-pay | Admitting: Physical Medicine and Rehabilitation

## 2017-07-24 DIAGNOSIS — R202 Paresthesia of skin: Secondary | ICD-10-CM | POA: Diagnosis not present

## 2017-07-24 NOTE — Progress Notes (Signed)
  Numeric Pain Rating Scale and Functional Assessment Average Pain 5   In the last MONTH (on 0-10 scale) has pain interfered with the following?  1. General activity like being  able to carry out your everyday physical activities such as walking, climbing stairs, carrying groceries, or moving a chair?  Rating(7)   +Driver, -BT, -Dye Allergies. 

## 2017-07-25 NOTE — Progress Notes (Signed)
Heather Rios - 52 y.o. female MRN [id number]  Date of birth: Nov 26, 1965  Office Visit Note: Visit Date: 07/24/2017 PCP: Maury Dus, MD Referred by: Maury Dus, MD  Subjective: Chief Complaint  Patient presents with  . Right Hand - Pain, Numbness  . Left Hand - Pain, Numbness   HPI: Heather Rios is a 52 year old right-hand-dominant female who comes in today at the request of Dr. Erlinda Hong for electrodiagnostic studies of the upper limbs.  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  She reports bilateral hand pain with soreness and tightness and numbness globally in all digits.  She reports that the left hand is more sore than the right hand.  She reports that the symptoms have been ongoing for about 2 months.  She does not endorse a specific injury.  Again working and lifting make the pain worse.  She has been sleeping with a brace on the right hand which seems to help some.  She has been using a cream on the hand as well.  She has had anti-inflammatory and some tramadol for pain relief.  She has had no prior electrodiagnostic studies.  She does not endorse any radicular complaints.  She is not diabetic.   ROS Otherwise per HPI.  Assessment & Plan: Visit Diagnoses:  1. Paresthesia of skin     Plan: No additional findings.  Impression: The above electrodiagnostic study is ABNORMAL and reveals evidence of:  1. A moderate right median nerve entrapment at the wrist (carpal tunnel syndrome) affecting sensory and motor components.   2. A mild left median nerve entrapment at the wrist (carpal tunnel syndrome) affecting sensory components.   There is no significant electrodiagnostic evidence of any other focal nerve entrapment, brachial plexopathy or cervical radiculopathy.   Recommendations: 1.  Follow-up with referring physician. 2.  Continue current management of symptoms. 3.  Continue use of resting splint at night-time and as needed during  the day. 4.  Suggest surgical evaluation.    Meds & Orders: No orders of the defined types were placed in this encounter.   Orders Placed This Encounter  Procedures  . NCV with EMG (electromyography)    Follow-up: Return for Dr. Erlinda Hong.   Procedures: No procedures performed  EMG & NCV Findings: Evaluation of the right median motor nerve showed prolonged distal onset latency (4.9 ms) and decreased conduction velocity (Elbow-Wrist, 45 m/s).  The left median (across palm) sensory nerve showed prolonged distal peak latency (Wrist, 3.8 ms).  The right median (across palm) sensory nerve showed prolonged distal peak latency (Wrist, 5.5 ms) and prolonged distal peak latency (Palm, 2.8 ms).  All remaining nerves (as indicated in the following tables) were within normal limits.  Left vs. Right side comparison data for the median motor nerve indicates abnormal L-R latency difference (0.9 ms) and abnormal L-R velocity difference (Elbow-Wrist, 10 m/s).  All remaining left vs. right side differences were within normal limits.    All examined muscles (as indicated in the following table) showed no evidence of electrical instability.    Impression: The above electrodiagnostic study is ABNORMAL and reveals evidence of:  1. A moderate right median nerve entrapment at the wrist (carpal tunnel syndrome) affecting sensory and motor components.   2. A mild left median nerve entrapment at the wrist (carpal tunnel syndrome) affecting sensory components.   There is no significant electrodiagnostic evidence of any other focal nerve entrapment, brachial plexopathy or cervical  radiculopathy.   Recommendations: 1.  Follow-up with referring physician. 2.  Continue current management of symptoms. 3.  Continue use of resting splint at night-time and as needed during the day. 4.  Suggest surgical evaluation.   Nerve Conduction Studies Anti Sensory Summary Table   Stim Site NR Peak (ms) Norm Peak (ms) P-T Amp (V)  Norm P-T Amp Site1 Site2 Delta-P (ms) Dist (cm) Vel (m/s) Norm Vel (m/s)  Left Median Acr Palm Anti Sensory (2nd Digit)  32.8C  Wrist    *3.8 <3.6 26.4 >10 Wrist Palm 2.2 0.0    Palm    1.6 <2.0 32.7         Right Median Acr Palm Anti Sensory (2nd Digit)  30.7C  Wrist    *5.5 <3.6 15.6 >10 Wrist Palm 2.7 0.0    Palm    *2.8 <2.0 9.9         Left Radial Anti Sensory (Base 1st Digit)  32.7C  Wrist    1.9 <3.1 31.1  Wrist Base 1st Digit 1.9 0.0    Right Radial Anti Sensory (Base 1st Digit)  31.3C  Wrist    1.9 <3.1 46.2  Wrist Base 1st Digit 1.9 0.0    Left Ulnar Anti Sensory (5th Digit)  33.1C  Wrist    3.1 <3.7 26.3 >15.0 Wrist 5th Digit 3.1 14.0 45 >38  Right Ulnar Anti Sensory (5th Digit)  31.1C  Wrist    3.3 <3.7 36.9 >15.0 Wrist 5th Digit 3.3 14.0 42 >38   Motor Summary Table   Stim Site NR Onset (ms) Norm Onset (ms) O-P Amp (mV) Norm O-P Amp Site1 Site2 Delta-0 (ms) Dist (cm) Vel (m/s) Norm Vel (m/s)  Left Median Motor (Abd Poll Brev)  32.9C  Wrist    4.0 <4.2 11.3 >5 Elbow Wrist 4.0 22.0 55 >50  Elbow    8.0  10.6         Right Median Motor (Abd Poll Brev)  31.3C  Wrist    *4.9 <4.2 10.9 >5 Elbow Wrist 4.8 21.5 *45 >50  Elbow    9.7  9.7         Left Ulnar Motor (Abd Dig Min)  33C  Wrist    2.4 <4.2 7.1 >3 B Elbow Wrist 3.5 20.0 57 >53  B Elbow    5.9  4.4  A Elbow B Elbow 1.4 10.0 71 >53  A Elbow    7.3  4.4         Right Ulnar Motor (Abd Dig Min)  32.2C  Wrist    2.8 <4.2 9.2 >3 B Elbow Wrist 3.2 20.0 63 >53  B Elbow    6.0  8.3  A Elbow B Elbow 1.3 10.0 77 >53  A Elbow    7.3  7.7          EMG   Side Muscle Nerve Root Ins Act Fibs Psw Amp Dur Poly Recrt Int Fraser Din Comment  Right Abd Poll Brev Median C8-T1 Nml Nml Nml Nml Nml 0 Nml Nml   Right 1stDorInt Ulnar C8-T1 Nml Nml Nml Nml Nml 0 Nml Nml   Right PronatorTeres Median C6-7 Nml Nml Nml Nml Nml 0 Nml Nml   Right Biceps Musculocut C5-6 Nml Nml Nml Nml Nml 0 Nml Nml   Right Deltoid Axillary C5-6 Nml Nml Nml Nml  Nml 0 Nml Nml     Nerve Conduction Studies Anti Sensory Left/Right Comparison   Stim Site L Lat (ms) R Lat (  ms) L-R Lat (ms) L Amp (V) R Amp (V) L-R Amp (%) Site1 Site2 L Vel (m/s) R Vel (m/s) L-R Vel (m/s)  Median Acr Palm Anti Sensory (2nd Digit)  32.8C  Wrist *3.8 *5.5 1.7 26.4 15.6 40.9 Wrist Palm     Palm 1.6 *2.8 1.2 32.7 9.9 69.7       Radial Anti Sensory (Base 1st Digit)  32.7C  Wrist 1.9 1.9 0.0 31.1 46.2 32.7 Wrist Base 1st Digit     Ulnar Anti Sensory (5th Digit)  33.1C  Wrist 3.1 3.3 0.2 26.3 36.9 28.7 Wrist 5th Digit 45 42 3   Motor Left/Right Comparison   Stim Site L Lat (ms) R Lat (ms) L-R Lat (ms) L Amp (mV) R Amp (mV) L-R Amp (%) Site1 Site2 L Vel (m/s) R Vel (m/s) L-R Vel (m/s)  Median Motor (Abd Poll Brev)  32.9C  Wrist 4.0 *4.9 *0.9 11.3 10.9 3.5 Elbow Wrist 55 *45 *10  Elbow 8.0 9.7 1.7 10.6 9.7 8.5       Ulnar Motor (Abd Dig Min)  33C  Wrist 2.4 2.8 0.4 7.1 9.2 22.8 B Elbow Wrist 57 63 6  B Elbow 5.9 6.0 0.1 4.4 8.3 47.0 A Elbow B Elbow 71 77 6  A Elbow 7.3 7.3 0.0 4.4 7.7 42.9          Waveforms:                     Clinical History: No specialty comments available.   She reports that she has never smoked. She has never used smokeless tobacco. No results for input(s): HGBA1C, LABURIC in the last 8760 hours.  Objective:  VS:  HT:    WT:   BMI:     BP:   HR: bpm  TEMP: ( )  RESP:  Physical Exam  Musculoskeletal:  Inspection reveals no atrophy of the bilateral APB or FDI or hand intrinsics. There is no swelling, color changes, allodynia or dystrophic changes. There is 5 out of 5 strength in the bilateral wrist extension, finger abduction and long finger flexion. There is intact sensation to light touch in all dermatomal and peripheral nerve distributions. There is a negative Hoffmann's test bilaterally.    Ortho Exam Imaging: No results found.  Past Medical/Family/Surgical/Social History: Medications & Allergies reviewed per EMR,  new medications updated. Patient Active Problem List   Diagnosis Date Noted  . S/P TAH (total abdominal hysterectomy) 03/10/2015  . Vasovagal syncope 07/07/2014  . Essential hypertension 07/07/2014  . Hyperlipidemia 07/07/2014   Past Medical History:  Diagnosis Date  . Allergic rhinitis   . Anemia   . Anginal pain ([place])    Chest Pains 8/16  . Eczema   . Environmental allergies   . GERD (gastroesophageal reflux disease)   . Heart murmur   . High cholesterol   . Hypertension   . Hypoglycemia   . Hypoglycemia   . Plantar fasciitis   . Sinusitis, chronic    Family History  Problem Relation Age of Onset  . Heart failure Mother   . Hyperlipidemia Mother   . Heart attack Mother 37  . Hyperlipidemia Father   . Kidney failure Father        kidney transplant receipiant  . Diabetes Unknown   . High blood pressure Unknown   . Breast cancer Neg Hx   . Colon cancer Neg Hx   . Colon polyps Neg Hx   . Esophageal cancer Neg Hx   .  Rectal cancer Neg Hx   . Stomach cancer Neg Hx    Past Surgical History:  Procedure Laterality Date  . ABDOMINAL HYSTERECTOMY N/A 03/10/2015   Procedure: HYSTERECTOMY ABDOMINAL;  Surgeon: Thurnell Lose, MD;  Location: [facility];  Service: Gynecology;  Laterality: N/A;  . BILATERAL SALPINGECTOMY Bilateral 03/10/2015   pt denies  . CHOLECYSTECTOMY    . FOOT SURGERY     Social History   Occupational History  . Not on file  Tobacco Use  . Smoking status: Never Smoker  . Smokeless tobacco: Never Used  Substance and Sexual Activity  . Alcohol use: No  . Drug use: No  . Sexual activity: Yes    Birth control/protection: Implant

## 2017-07-25 NOTE — Procedures (Signed)
EMG & NCV Findings: Evaluation of the right median motor nerve showed prolonged distal onset latency (4.9 ms) and decreased conduction velocity (Elbow-Wrist, 45 m/s).  The left median (across palm) sensory nerve showed prolonged distal peak latency (Wrist, 3.8 ms).  The right median (across palm) sensory nerve showed prolonged distal peak latency (Wrist, 5.5 ms) and prolonged distal peak latency (Palm, 2.8 ms).  All remaining nerves (as indicated in the following tables) were within normal limits.  Left vs. Right side comparison data for the median motor nerve indicates abnormal L-R latency difference (0.9 ms) and abnormal L-R velocity difference (Elbow-Wrist, 10 m/s).  All remaining left vs. right side differences were within normal limits.    All examined muscles (as indicated in the following table) showed no evidence of electrical instability.    Impression: The above electrodiagnostic study is ABNORMAL and reveals evidence of:  1. A moderate right median nerve entrapment at the wrist (carpal tunnel syndrome) affecting sensory and motor components.   2. A mild left median nerve entrapment at the wrist (carpal tunnel syndrome) affecting sensory components.   There is no significant electrodiagnostic evidence of any other focal nerve entrapment, brachial plexopathy or cervical radiculopathy.   Recommendations: 1.  Follow-up with referring physician. 2.  Continue current management of symptoms. 3.  Continue use of resting splint at night-time and as needed during the day. 4.  Suggest surgical evaluation.   Nerve Conduction Studies Anti Sensory Summary Table   Stim Site NR Peak (ms) Norm Peak (ms) P-T Amp (V) Norm P-T Amp Site1 Site2 Delta-P (ms) Dist (cm) Vel (m/s) Norm Vel (m/s)  Left Median Acr Palm Anti Sensory (2nd Digit)  32.8C  Wrist    *3.8 <3.6 26.4 >10 Wrist Palm 2.2 0.0    Palm    1.6 <2.0 32.7         Right Median Acr Palm Anti Sensory (2nd Digit)  30.7C  Wrist    *5.5  <3.6 15.6 >10 Wrist Palm 2.7 0.0    Palm    *2.8 <2.0 9.9         Left Radial Anti Sensory (Base 1st Digit)  32.7C  Wrist    1.9 <3.1 31.1  Wrist Base 1st Digit 1.9 0.0    Right Radial Anti Sensory (Base 1st Digit)  31.3C  Wrist    1.9 <3.1 46.2  Wrist Base 1st Digit 1.9 0.0    Left Ulnar Anti Sensory (5th Digit)  33.1C  Wrist    3.1 <3.7 26.3 >15.0 Wrist 5th Digit 3.1 14.0 45 >38  Right Ulnar Anti Sensory (5th Digit)  31.1C  Wrist    3.3 <3.7 36.9 >15.0 Wrist 5th Digit 3.3 14.0 42 >38   Motor Summary Table   Stim Site NR Onset (ms) Norm Onset (ms) O-P Amp (mV) Norm O-P Amp Site1 Site2 Delta-0 (ms) Dist (cm) Vel (m/s) Norm Vel (m/s)  Left Median Motor (Abd Poll Brev)  32.9C  Wrist    4.0 <4.2 11.3 >5 Elbow Wrist 4.0 22.0 55 >50  Elbow    8.0  10.6         Right Median Motor (Abd Poll Brev)  31.3C  Wrist    *4.9 <4.2 10.9 >5 Elbow Wrist 4.8 21.5 *45 >50  Elbow    9.7  9.7         Left Ulnar Motor (Abd Dig Min)  33C  Wrist    2.4 <4.2 7.1 >3 B Elbow Wrist 3.5 20.0 57 >53  B Elbow    5.9  4.4  A Elbow B Elbow 1.4 10.0 71 >53  A Elbow    7.3  4.4         Right Ulnar Motor (Abd Dig Min)  32.2C  Wrist    2.8 <4.2 9.2 >3 B Elbow Wrist 3.2 20.0 63 >53  B Elbow    6.0  8.3  A Elbow B Elbow 1.3 10.0 77 >53  A Elbow    7.3  7.7          EMG   Side Muscle Nerve Root Ins Act Fibs Psw Amp Dur Poly Recrt Int Fraser Din Comment  Right Abd Poll Brev Median C8-T1 Nml Nml Nml Nml Nml 0 Nml Nml   Right 1stDorInt Ulnar C8-T1 Nml Nml Nml Nml Nml 0 Nml Nml   Right PronatorTeres Median C6-7 Nml Nml Nml Nml Nml 0 Nml Nml   Right Biceps Musculocut C5-6 Nml Nml Nml Nml Nml 0 Nml Nml   Right Deltoid Axillary C5-6 Nml Nml Nml Nml Nml 0 Nml Nml     Nerve Conduction Studies Anti Sensory Left/Right Comparison   Stim Site L Lat (ms) R Lat (ms) L-R Lat (ms) L Amp (V) R Amp (V) L-R Amp (%) Site1 Site2 L Vel (m/s) R Vel (m/s) L-R Vel (m/s)  Median Acr Palm Anti Sensory (2nd Digit)  32.8C  Wrist *3.8 *5.5  1.7 26.4 15.6 40.9 Wrist Palm     Palm 1.6 *2.8 1.2 32.7 9.9 69.7       Radial Anti Sensory (Base 1st Digit)  32.7C  Wrist 1.9 1.9 0.0 31.1 46.2 32.7 Wrist Base 1st Digit     Ulnar Anti Sensory (5th Digit)  33.1C  Wrist 3.1 3.3 0.2 26.3 36.9 28.7 Wrist 5th Digit 45 42 3   Motor Left/Right Comparison   Stim Site L Lat (ms) R Lat (ms) L-R Lat (ms) L Amp (mV) R Amp (mV) L-R Amp (%) Site1 Site2 L Vel (m/s) R Vel (m/s) L-R Vel (m/s)  Median Motor (Abd Poll Brev)  32.9C  Wrist 4.0 *4.9 *0.9 11.3 10.9 3.5 Elbow Wrist 55 *45 *10  Elbow 8.0 9.7 1.7 10.6 9.7 8.5       Ulnar Motor (Abd Dig Min)  33C  Wrist 2.4 2.8 0.4 7.1 9.2 22.8 B Elbow Wrist 57 63 6  B Elbow 5.9 6.0 0.1 4.4 8.3 47.0 A Elbow B Elbow 71 77 6  A Elbow 7.3 7.3 0.0 4.4 7.7 42.9          Waveforms:

## 2017-07-29 MED FILL — IRBESARTAN 300 MG TABLET: 300 | 90 days supply | Qty: 90 | Fill #0

## 2017-07-30 MED FILL — valACYclovir HCL 1 GM TABS: 1 | 2 days supply | Qty: 8 | Fill #0

## 2017-08-06 ENCOUNTER — Ambulatory Visit (INDEPENDENT_AMBULATORY_CARE_PROVIDER_SITE_OTHER): Payer: Self-pay | Admitting: Orthopaedic Surgery

## 2017-08-06 DIAGNOSIS — R350 Frequency of micturition: Secondary | ICD-10-CM | POA: Diagnosis not present

## 2017-08-06 DIAGNOSIS — I10 Essential (primary) hypertension: Secondary | ICD-10-CM | POA: Diagnosis not present

## 2017-08-06 DIAGNOSIS — F419 Anxiety disorder, unspecified: Secondary | ICD-10-CM | POA: Diagnosis not present

## 2017-08-08 ENCOUNTER — Ambulatory Visit (INDEPENDENT_AMBULATORY_CARE_PROVIDER_SITE_OTHER): Payer: 59 | Admitting: Orthopaedic Surgery

## 2017-08-08 ENCOUNTER — Encounter (INDEPENDENT_AMBULATORY_CARE_PROVIDER_SITE_OTHER): Payer: Self-pay | Admitting: Orthopaedic Surgery

## 2017-08-08 DIAGNOSIS — G5602 Carpal tunnel syndrome, left upper limb: Secondary | ICD-10-CM | POA: Diagnosis not present

## 2017-08-08 DIAGNOSIS — G5601 Carpal tunnel syndrome, right upper limb: Secondary | ICD-10-CM

## 2017-08-08 MED ORDER — BUPIVACAINE HCL 0.5 % IJ SOLN
1.0000 mL | INTRAMUSCULAR | Status: AC | PRN
  Administered 2017-08-08: 1 mL

## 2017-08-08 MED ORDER — METHYLPREDNISOLONE ACETATE 40 MG/ML IJ SUSP
40.0000 mg | INTRAMUSCULAR | Status: AC | PRN
  Administered 2017-08-08: 40 mg

## 2017-08-08 MED ORDER — LIDOCAINE HCL 1 % IJ SOLN
1.0000 mL | INTRAMUSCULAR | Status: AC | PRN
  Administered 2017-08-08: 1 mL

## 2017-08-08 NOTE — Progress Notes (Signed)
Office Visit Note   Patient: Heather Rios           Date of Birth: 11-27-1965           MRN: [id number] Visit Date: 08/08/2017              Requested by: Maury Dus, MD [address], [place] [id number] PCP: Maury Dus, MD   Assessment & Plan: Visit Diagnoses:  1. Right carpal tunnel syndrome   2. Left carpal tunnel syndrome     Plan: Impression is moderate right carpal tunnel syndrome and mild left carpal tunnel syndrome.  Patient will continue to wear nighttime splints.  Bilateral carpal tunnel injections performed today.  Patient tolerates well.  Follow-up as needed.  Follow-Up Instructions: Return if symptoms worsen or fail to improve.   Orders:  No orders of the defined types were placed in this encounter.  No orders of the defined types were placed in this encounter.     Procedures: Hand/UE Inj: bilateral carpal tunnel for carpal tunnel syndrome on 08/08/2017 10:13 AM Indications: pain Details: 25 G needle Medications (Right): 1 mL lidocaine 1 %; 1 mL bupivacaine 0.5 %; 40 mg methylPREDNISolone acetate 40 MG/ML Medications (Left): 1 mL lidocaine 1 %; 1 mL bupivacaine 0.5 %; 40 mg methylPREDNISolone acetate 40 MG/ML Outcome: tolerated well, no immediate complications Patient was prepped and draped in the usual sterile fashion.       Clinical Data: No additional findings.   Subjective: Chief Complaint  Patient presents with  . Right Hand - Pain  . Left Hand - Pain    Patient is here to review nerve conduction studies.  These showed moderate carpal tunnel on the right and mild on the left.   Review of Systems   Objective: Vital Signs: LMP 01/10/2015 (Exact Date)   Physical Exam  Ortho Exam Exam is stable. Specialty Comments:  No specialty comments available.  Imaging: No results found.   PMFS History: Patient Active Problem List   Diagnosis Date Noted  . S/P TAH (total abdominal hysterectomy) 03/10/2015  .  Vasovagal syncope 07/07/2014  . Essential hypertension 07/07/2014  . Hyperlipidemia 07/07/2014   Past Medical History:  Diagnosis Date  . Allergic rhinitis   . Anemia   . Anginal pain ([place])    Chest Pains 8/16  . Eczema   . Environmental allergies   . GERD (gastroesophageal reflux disease)   . Heart murmur   . High cholesterol   . Hypertension   . Hypoglycemia   . Hypoglycemia   . Plantar fasciitis   . Sinusitis, chronic     Family History  Problem Relation Age of Onset  . Heart failure Mother   . Hyperlipidemia Mother   . Heart attack Mother 47  . Hyperlipidemia Father   . Kidney failure Father        kidney transplant receipiant  . Diabetes Unknown   . High blood pressure Unknown   . Breast cancer Neg Hx   . Colon cancer Neg Hx   . Colon polyps Neg Hx   . Esophageal cancer Neg Hx   . Rectal cancer Neg Hx   . Stomach cancer Neg Hx     Past Surgical History:  Procedure Laterality Date  . ABDOMINAL HYSTERECTOMY N/A 03/10/2015   Procedure: HYSTERECTOMY ABDOMINAL;  Surgeon: Thurnell Lose, MD;  Location: [facility];  Service: Gynecology;  Laterality: N/A;  . BILATERAL SALPINGECTOMY Bilateral 03/10/2015   pt denies  .  CHOLECYSTECTOMY    . FOOT SURGERY     Social History   Occupational History  . Not on file  Tobacco Use  . Smoking status: Never Smoker  . Smokeless tobacco: Never Used  Substance and Sexual Activity  . Alcohol use: No  . Drug use: No  . Sexual activity: Yes    Birth control/protection: Implant

## 2017-08-09 MED FILL — CIPROFLOXACIN HCL 250 MG TA: 250 | 3 days supply | Qty: 6 | Fill #0

## 2017-08-21 MED FILL — AMLODIPINE BESYLATE 5 MG TA: 5 | 90 days supply | Qty: 90 | Fill #0

## 2017-08-22 ENCOUNTER — Ambulatory Visit (INDEPENDENT_AMBULATORY_CARE_PROVIDER_SITE_OTHER): Payer: Self-pay | Admitting: Family Medicine

## 2017-08-22 VITALS — BP 130/70 | HR 70 | Temp 97.6°F | Resp 16 | Wt 140.4 lb

## 2017-08-22 DIAGNOSIS — S90221A Contusion of right lesser toe(s) with damage to nail, initial encounter: Secondary | ICD-10-CM

## 2017-08-22 NOTE — Progress Notes (Signed)
Heather Rios is a 52 y.o. female who presents today with concerns of dark spot of right great toe.  Review of Systems  Constitutional: Negative for chills, fever and malaise/fatigue.  HENT: Negative for congestion, ear discharge, ear pain, sinus pain and sore throat.   Eyes: Negative.   Respiratory: Negative for cough, sputum production and shortness of breath.   Cardiovascular: Negative.  Negative for chest pain.  Gastrointestinal: Negative for abdominal pain, diarrhea, nausea and vomiting.  Genitourinary: Negative for dysuria, frequency, hematuria and urgency.  Musculoskeletal: Negative for myalgias.  Skin: Negative.        Discoloration of right great toe  Neurological: Negative for headaches.  Endo/Heme/Allergies: Negative.   Psychiatric/Behavioral: Negative.     O: Vitals:   08/22/17 1342  BP: 130/70  Pulse: 70  Resp: 16  Temp: 97.6 F (36.4 C)  SpO2: 100%     Physical Exam  Constitutional: She is oriented to person, place, and time. Vital signs are normal. She appears well-developed and well-nourished. She is active.  Non-toxic appearance. She does not have a sickly appearance.  HENT:  Head: Normocephalic.  Right Ear: Hearing, tympanic membrane, external ear and ear canal normal.  Left Ear: Hearing, tympanic membrane, external ear and ear canal normal.  Nose: Nose normal.  Mouth/Throat: Uvula is midline and oropharynx is clear and moist.  Neck: Normal range of motion. Neck supple.  Cardiovascular: Normal rate, regular rhythm, normal heart sounds and normal pulses.  Pulmonary/Chest: Effort normal and breath sounds normal.  Abdominal: Soft. Bowel sounds are normal.  Musculoskeletal: Normal range of motion.  Lymphadenopathy:       Head (right side): No submental and no submandibular adenopathy present.       Head (left side): No submental and no submandibular adenopathy present.    She has no cervical adenopathy.  Neurological: She is alert and oriented to  person, place, and time.  Psychiatric: She has a normal mood and affect.  Vitals reviewed. Right foot great toenail lower lateral side- area very dark toe is otherwise WNL with good sensation, capillary refill and warm to touch with out pain, deformity, swelling or erythema. Toenail is otherwise intact and WNL on exam.   A: 1. Toenail bruise, right, initial encounter    P: Exam findings, diagnosis etiology and medication use and indications reviewed with patient. Follow- Up and discharge instructions provided. No emergent/urgent issues found on exam.  Patient verbalized understanding of information provided and agrees with plan of care (POC), all questions answered.  1. Toenail bruise, right, initial encounter Follow up with dermatology- unable to differentiate bruise versus other skin abnormality.

## 2017-08-22 NOTE — Patient Instructions (Signed)
Fingernail or Toenail Removal, Adult, Care After  This sheet gives you information about how to care for yourself after your procedure. Your health care provider may also give you more specific instructions. If you have problems or questions, contact your health care provider.  What can I expect after the procedure?  After the procedure, it is common to have:  · Pain.  · Redness.  · Swelling.  · Soreness.    Follow these instructions at home:  · If you have a splint:  ? Do not put pressure on any part of the splint until it is fully hardened. This may take several hours.  ? Wear the splint as told by your health care provider. Remove it only as told by your health care provider.  ? Loosen the splint if your fingers or toes tingle, become numb, or turn cold and blue.  ? Keep the splint clean.  ? If the splint is not waterproof:  § Do not let it get wet.  § Cover it with a watertight covering when you take a bath or a shower.  Wound care    · Follow instructions from your health care provider about how to take care of your wound. Make sure you:  ? Wash your hands with soap and water before you change your bandage (dressing). If soap and water are not available, use hand sanitizer.  ? Change your dressing as told by your health care provider.  ? Keep your dressing dry until your health care provider says it can be removed.  ? Leave stitches (sutures), skin glue, or adhesive strips in place. These skin closures may need to stay in place for 2 weeks or longer. If adhesive strip edges start to loosen and curl up, you may trim the loose edges. Do not remove adhesive strips completely unless your health care provider tells you to do that.  · Check your wound every day for signs of infection. Check for:  ? More redness, swelling, or pain.  ? More fluid or blood.  ? Warmth.  ? Pus or a bad smell.  Managing pain, stiffness, and swelling  · Move your fingers or toes often to avoid stiffness and to lessen swelling.  · Raise  (elevate) the injured area above the level of your heart while you are sitting or lying down. You may need to keep your finger or toe raised or supported on a pillow for 24 hours or as told by your health care provider.  · Soak your hand or foot in warm, soapy water for 10-20 minutes, 3 times a day or as told by your health care provider.  Medicine  · Take over-the-counter and prescription medicines only as told by your health care provider.  · If you were prescribed an antibiotic medicine, use it as told by your health care provider. Do not stop using the antibiotic even if your condition improves.  General instructions  · If you were given a shoe to wear, wear it as told by your health care provider.  · Keep all follow-up visits as told by your health care provider. This is important.  Contact a health care provider if:  · You have more redness, swelling, or pain around your wound.  · You have more fluid or blood coming from your wound.  · Your wound feels warm to the touch.  · You have pus or a bad smell coming from your wound.  · You have a fever.  · Your   finger or toe looks blue or black.  This information is not intended to replace advice given to you by your health care provider. Make sure you discuss any questions you have with your health care provider.  Document Released: 03/26/2014 Document Revised: 11/02/2015 Document Reviewed: 09/12/2015  [employer] Interactive Patient Education © 2018 [employer]

## 2017-08-26 ENCOUNTER — Other Ambulatory Visit: Payer: Self-pay | Admitting: Family Medicine

## 2017-08-26 DIAGNOSIS — Z1231 Encounter for screening mammogram for malignant neoplasm of breast: Secondary | ICD-10-CM

## 2017-09-06 MED FILL — AZELASTINE HCL 0.05% DROPS: 0.05 | 30 days supply | Qty: 6 | Fill #1

## 2017-09-16 ENCOUNTER — Ambulatory Visit
Admission: RE | Admit: 2017-09-16 | Discharge: 2017-09-16 | Disposition: A | Discharge status: Home / Self Care | Payer: 59 | Source: Ambulatory Visit | Attending: Family Medicine | Admitting: Family Medicine

## 2017-09-16 DIAGNOSIS — Z1231 Encounter for screening mammogram for malignant neoplasm of breast: Secondary | ICD-10-CM | POA: Insufficient documentation

## 2017-09-23 ENCOUNTER — Other Ambulatory Visit: Payer: Self-pay | Admitting: Family Medicine

## 2017-09-23 DIAGNOSIS — N6489 Other specified disorders of breast: Secondary | ICD-10-CM

## 2017-09-23 DIAGNOSIS — R928 Other abnormal and inconclusive findings on diagnostic imaging of breast: Secondary | ICD-10-CM

## 2017-09-25 ENCOUNTER — Ambulatory Visit
Admission: RE | Admit: 2017-09-25 | Discharge: 2017-09-25 | Disposition: A | Discharge status: Home / Self Care | Payer: 59 | Source: Ambulatory Visit | Attending: Family Medicine | Admitting: Family Medicine

## 2017-09-25 DIAGNOSIS — R928 Other abnormal and inconclusive findings on diagnostic imaging of breast: Secondary | ICD-10-CM

## 2017-09-25 DIAGNOSIS — N6489 Other specified disorders of breast: Secondary | ICD-10-CM

## 2017-09-30 MED FILL — PRAVASTATIN NA 40 MG TAB: 40 | 90 days supply | Qty: 90 | Fill #1

## 2017-10-07 MED FILL — PANTOPRAZOLE SOD DR 40 MG T: 40 | 90 days supply | Qty: 180 | Fill #0

## 2017-10-25 DIAGNOSIS — J0100 Acute maxillary sinusitis, unspecified: Secondary | ICD-10-CM | POA: Diagnosis not present

## 2017-10-25 MED FILL — CIPRODEX OTIC SUSPENSION: 0.3-0.1 | 19 days supply | Qty: 8 | Fill #0

## 2017-10-25 MED FILL — CEFDINIR 300 MG CAPSULE: 300 | 10 days supply | Qty: 20 | Fill #0

## 2017-10-29 MED FILL — FLUCONAZOLE 150 MG TABS: 150 | 2 days supply | Qty: 2 | Fill #0

## 2017-10-30 ENCOUNTER — Encounter: Payer: Self-pay | Admitting: Podiatry

## 2017-10-30 ENCOUNTER — Ambulatory Visit: Payer: 59 | Admitting: Podiatry

## 2017-10-30 ENCOUNTER — Ambulatory Visit (INDEPENDENT_AMBULATORY_CARE_PROVIDER_SITE_OTHER): Payer: 59

## 2017-10-30 DIAGNOSIS — M722 Plantar fascial fibromatosis: Secondary | ICD-10-CM

## 2017-10-30 DIAGNOSIS — M7672 Peroneal tendinitis, left leg: Secondary | ICD-10-CM

## 2017-10-30 MED ORDER — METHYLPREDNISOLONE 4 MG PO TBPK
ORAL_TABLET | ORAL | 0 refills | Status: DC
Start: 2017-10-30 — End: 2018-05-19

## 2017-10-30 MED ORDER — DICLOFENAC SODIUM 75 MG PO TBEC: 75.0000 mg | DELAYED_RELEASE_TABLET | Freq: Two times a day (BID) | ORAL | 1 refills | Status: DC

## 2017-10-30 MED FILL — DICLOFENAC SODIUM 75 MG TAB: 75 | 30 days supply | Qty: 60 | Fill #0

## 2017-10-30 MED FILL — METHYLPREDNISOLONE 4 MG TAB: 4 | 6 days supply | Qty: 21 | Fill #0

## 2017-10-31 MED FILL — IRBESARTAN 300 MG TABS: 300 | 30 days supply | Qty: 30 | Fill #1

## 2017-11-01 NOTE — Progress Notes (Signed)
   Subjective: 52 year old female presenting today for follow up evaluation of bilateral plantar fasciitis. She reports the left foot is swollen. She denies any pain of the right foot but does reports some tightness. She denies modifying factors. Patient is here for further evaluation and treatment.   Past Medical History:  Diagnosis Date  . Allergic rhinitis   . Anemia   . Anginal pain ([place])    Chest Pains 8/16  . Eczema   . Environmental allergies   . GERD (gastroesophageal reflux disease)   . Heart murmur   . High cholesterol   . Hypertension   . Hypoglycemia   . Hypoglycemia   . Plantar fasciitis   . Sinusitis, chronic      Objective: Physical Exam General: The patient is alert and oriented x3 in no acute distress.  Dermatology: Skin is warm, dry and supple bilateral lower extremities. Negative for open lesions or macerations bilateral.   Vascular: Dorsalis Pedis and Posterior Tibial pulses palpable bilateral.  Capillary fill time is immediate to all digits.  Neurological: Epicritic and protective threshold intact bilateral.   Musculoskeletal: Tenderness to palpation to the plantar aspect of the bilateral heels along the plantar fascia as well as to the metatarsal heads of bilateral feet. All other joints range of motion within normal limits bilateral. Strength 5/5 in all groups bilateral.   Radiographic exam: Normal osseous mineralization. Joint spaces preserved. No fracture/dislocation/boney destruction. No other soft tissue abnormalities or radiopaque foreign bodies.   Assessment: 1. plantar fasciitis bilateral feet 2. Metatarsalgia bilateral   Plan of Care:  1. Patient evaluated. Xrays reviewed.   2. Rx for Medrol Dose Pak placed 3. Rx for Meloxicam ordered for patient. 4. Plantar fascial band(s) dispensed for bilateral plantar fasciitis. 5. Patient was fitted one year ago for custom molded orthotics, but cannot afford the copay at the moment.  6. Instructed  patient regarding therapies and modalities at home to alleviate symptoms.  7. Return to clinic as needed.    Edrick Kins, DPM [facility]  Dr. Edrick Kins, DPM    [address], [place] [address]                Office [phone]  Fax [phone]

## 2017-11-11 ENCOUNTER — Encounter: Payer: 59 | Admitting: Orthotics

## 2017-11-12 ENCOUNTER — Ambulatory Visit: Payer: 59 | Admitting: Orthotics

## 2017-11-12 DIAGNOSIS — M7672 Peroneal tendinitis, left leg: Secondary | ICD-10-CM

## 2017-11-12 DIAGNOSIS — B002 Herpesviral gingivostomatitis and pharyngotonsillitis: Secondary | ICD-10-CM | POA: Diagnosis not present

## 2017-11-12 DIAGNOSIS — J309 Allergic rhinitis, unspecified: Secondary | ICD-10-CM | POA: Diagnosis not present

## 2017-11-12 DIAGNOSIS — L309 Dermatitis, unspecified: Secondary | ICD-10-CM | POA: Diagnosis not present

## 2017-11-12 DIAGNOSIS — E7800 Pure hypercholesterolemia, unspecified: Secondary | ICD-10-CM | POA: Diagnosis not present

## 2017-11-12 DIAGNOSIS — F419 Anxiety disorder, unspecified: Secondary | ICD-10-CM | POA: Diagnosis not present

## 2017-11-12 DIAGNOSIS — K219 Gastro-esophageal reflux disease without esophagitis: Secondary | ICD-10-CM | POA: Diagnosis not present

## 2017-11-12 DIAGNOSIS — G5602 Carpal tunnel syndrome, left upper limb: Secondary | ICD-10-CM | POA: Diagnosis not present

## 2017-11-12 DIAGNOSIS — Z23 Encounter for immunization: Secondary | ICD-10-CM | POA: Diagnosis not present

## 2017-11-12 DIAGNOSIS — I10 Essential (primary) hypertension: Secondary | ICD-10-CM | POA: Diagnosis not present

## 2017-11-12 DIAGNOSIS — M722 Plantar fascial fibromatosis: Secondary | ICD-10-CM

## 2017-11-12 DIAGNOSIS — Z0000 Encounter for general adult medical examination without abnormal findings: Secondary | ICD-10-CM | POA: Diagnosis not present

## 2017-11-12 MED FILL — AMLODIPINE BESYLATE 5 MG TA: 5 | 90 days supply | Qty: 90 | Fill #0

## 2017-11-12 NOTE — Progress Notes (Signed)
Patient came in today to pick up custom made foot orthotics.  The goals were accomplished and the patient reported no dissatisfaction with said orthotics.  Patient was advised of breakin period and how to report any issues. 

## 2017-11-21 MED FILL — MONTELUKAST SOD 10 MG TAB: 10 | 30 days supply | Qty: 30 | Fill #0

## 2017-11-27 DIAGNOSIS — S300XXA Contusion of lower back and pelvis, initial encounter: Secondary | ICD-10-CM | POA: Diagnosis not present

## 2017-11-27 MED FILL — CYCLOBENZAPRINE 10 MG TAB: 10 | 30 days supply | Qty: 30 | Fill #0

## 2017-12-03 MED FILL — IRBESARTAN 300 MG TABS: 300 | 30 days supply | Qty: 30 | Fill #2

## 2017-12-24 MED FILL — valACYclovir HCL 1 GM TABS: 1 | 3 days supply | Qty: 12 | Fill #1

## 2017-12-31 DIAGNOSIS — R350 Frequency of micturition: Secondary | ICD-10-CM | POA: Diagnosis not present

## 2018-01-01 MED FILL — IRBESARTAN 300 MG TAB: 300 | 30 days supply | Qty: 30 | Fill #3

## 2018-01-02 DIAGNOSIS — N760 Acute vaginitis: Secondary | ICD-10-CM | POA: Diagnosis not present

## 2018-01-02 MED FILL — metroNIDAZOLE 500 MG TABS: 500 | 7 days supply | Qty: 14 | Fill #0

## 2018-01-04 DIAGNOSIS — H5203 Hypermetropia, bilateral: Secondary | ICD-10-CM | POA: Diagnosis not present

## 2018-01-06 MED FILL — PRAVASTATIN NA 40 MG TAB: 40 | 90 days supply | Qty: 90 | Fill #0

## 2018-01-21 ENCOUNTER — Other Ambulatory Visit: Payer: Self-pay | Admitting: Podiatry

## 2018-01-22 ENCOUNTER — Telehealth: Payer: Self-pay | Admitting: Podiatry

## 2018-01-22 MED ORDER — DICLOFENAC SODIUM 75 MG PO TBEC: 75.0000 mg | DELAYED_RELEASE_TABLET | Freq: Two times a day (BID) | ORAL | 0 refills | Status: DC

## 2018-01-22 NOTE — Telephone Encounter (Signed)
Patient is having a lot of inflammation on her left foot and wanted to see if she could get a refill on her medication.

## 2018-01-22 NOTE — Addendum Note (Signed)
Addended by: Harriett Sine D on: 01/22/2018 01:24 PM   Modules accepted: Orders

## 2018-01-22 NOTE — Telephone Encounter (Signed)
I'm calling the nurse back. I'm trying to make sure the right prescription was called in for the prednisone Pak. If you can call me back at [phone].

## 2018-01-22 NOTE — Telephone Encounter (Signed)
Left message instructing pt Dr. Amalia Hailey had refilled the antiinflammatory medication and she would need an appt prior to future refills.

## 2018-01-23 ENCOUNTER — Other Ambulatory Visit: Payer: Self-pay | Admitting: Podiatry

## 2018-01-24 NOTE — Telephone Encounter (Signed)
I spoke with pt and informed the request for the antiinflammatory had been refilled, and if she needed the medrol dose pack she would benefit from being seen in office. Pt states she has used her benefits card amount and wanted to know what to do. I told pt that she would need to be seen and could pay her co-pay with a debit or credit card.

## 2018-01-27 MED FILL — PANTOPRAZOLE SOD DR 40 MG T: 40 | 90 days supply | Qty: 180 | Fill #0

## 2018-01-31 MED FILL — IRBESARTAN 300 MG TAB: 300 | 90 days supply | Qty: 90 | Fill #0

## 2018-02-21 MED FILL — AMLODIPINE BESYLATE 5 MG TA: 5 | 90 days supply | Qty: 90 | Fill #1

## 2018-03-11 DIAGNOSIS — M545 Low back pain: Secondary | ICD-10-CM | POA: Diagnosis not present

## 2018-03-14 MED FILL — DICLOFENAC SODIUM 1 % GEL: 1 | 6 days supply | Qty: 100 | Fill #0

## 2018-03-24 DIAGNOSIS — J069 Acute upper respiratory infection, unspecified: Secondary | ICD-10-CM | POA: Diagnosis not present

## 2018-03-31 ENCOUNTER — Ambulatory Visit: Payer: PRIVATE HEALTH INSURANCE | Attending: Family Medicine

## 2018-03-31 ENCOUNTER — Other Ambulatory Visit: Payer: Self-pay

## 2018-03-31 DIAGNOSIS — M62830 Muscle spasm of back: Secondary | ICD-10-CM | POA: Diagnosis present

## 2018-03-31 DIAGNOSIS — M545 Low back pain, unspecified: Secondary | ICD-10-CM

## 2018-03-31 DIAGNOSIS — R293 Abnormal posture: Secondary | ICD-10-CM | POA: Diagnosis present

## 2018-03-31 NOTE — Therapy (Signed)
[place], [name], [address] Phone: [phone]   Fax:  [phone]  Physical Therapy Evaluation  Patient Details  Name: DOCIE ABRAMOVICH MRN: [id number] Date of Birth: 12/21/1965 Referring Provider (PT): Odis Luster, MD   Encounter Date: 03/31/2018  PT End of Session - 03/31/18 1044    Visit Number  1    Number of Visits  8    Date for PT Re-Evaluation  04/25/18    Authorization Type  [place] WC    PT Start Time  2426    PT Stop Time  1140    PT Time Calculation (min)  55 min    Activity Tolerance  Patient tolerated treatment well    Behavior During Therapy  [facility] for tasks assessed/performed       Past Medical History:  Diagnosis Date  . Allergic rhinitis   . Anemia   . Anginal pain ([place])    Chest Pains 8/16  . Eczema   . Environmental allergies   . GERD (gastroesophageal reflux disease)   . Heart murmur   . High cholesterol   . Hypertension   . Hypoglycemia   . Hypoglycemia   . Plantar fasciitis   . Sinusitis, chronic     Past Surgical History:  Procedure Laterality Date  . ABDOMINAL HYSTERECTOMY N/A 03/10/2015   Procedure: HYSTERECTOMY ABDOMINAL;  Surgeon: Thurnell Lose, MD;  Location: [facility];  Service: Gynecology;  Laterality: N/A;  . BILATERAL SALPINGECTOMY Bilateral 03/10/2015   pt denies  . CHOLECYSTECTOMY    . FOOT SURGERY      There were no vitals filed for this visit.   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  Turning body to RT.   She continued to work and finished shift.  Now on light duty.    Shift is 8-12 hours.  Normal schedule is 8 hours.   HEP from MD hip adductions  and lifting each leg individually    Limitations  Lifting   sleep on RT side harder   How long can you sit comfortably?  Some times has pain.    Lying also causes pain    How long can you stand  comfortably?  60 min    How long can you walk comfortably?  60 min    Diagnostic tests  none    Patient Stated Goals  She wants to improve strength.    Decrease pain.    Currently in Pain?  Yes    Pain Score  4     Pain Location  Back    Pain Orientation  Right    Pain Descriptors / Indicators  Sore;Tender    Pain Type  --   sub acute   Pain Radiating Towards  to RT lateral hip    Pain Onset  1 to 4 weeks ago    Pain Frequency  Intermittent   with pain meds   Aggravating Factors   lifting , pronged activity and postions    Pain Relieving Factors  meds, ice          [facility] PT Assessment - 03/31/18 0001      Assessment   Medical Diagnosis  Lower back pain    Referring Provider (PT)  Odis Luster, MD    Onset Date/Surgical Date  --   03/03/18   Next MD Visit  1/40/20    Prior Therapy  no      Precautions   Precaution Comments  10 pound lift limited      Restrictions   Weight Bearing Restrictions  No      Balance Screen   Has the patient fallen in the past 6 months  Yes    How many times?  1   running from dog   Has the patient had a decrease in activity level because of a fear of falling?   No    Is the patient reluctant to leave their home because of a fear of falling?   No      Prior Function   Vocation  Full time employment    Vocation Requirements  now on light duty.    50# lifting  from floor Gustavo Lah /   over head      Cognition   Overall Cognitive Status  Within Functional Limits for tasks assessed      Observation/Other Assessments   Focus on Therapeutic Outcomes (FOTO)   49% limited      Posture/Postural Control   Posture Comments  RT shoulder higher,  RT pelvis higher.  neck extended      ROM / Strength   AROM / PROM / Strength  AROM;Strength      AROM   AROM Assessment Site  Lumbar    Lumbar Flexion  50   more strain   Lumbar Extension  20    Lumbar - Right Side Bend  10   pain on RT    Lumbar - Left Side Bend  20      Strength   Overall  Strength Comments  WNL in both LE      Flexibility   Soft Tissue Assessment /Muscle Length  yes    Hamstrings  lt 80  RT 70       Palpation   SI assessment   ASIS more distal                  Objective measurements completed on examination: See above findings.              PT Education - 03/31/18 1042    Education Details  POC , HEP    Methods  Explanation;Tactile cues;Verbal cues;Handout    Comprehension  Returned demonstration;Verbalized understanding          PT Long Term Goals - 03/31/18 1043      PT LONG TERM GOAL #1   Title  She will be independnet with all HEp issued    Time  4    Period  Weeks    Status  New      PT LONG TERM GOAL #2   Title  she will report pain decr 75% or more    Time  4    Period  Weeks    Status  New      PT LONG TERM GOAL #3   Title  SHe will be able to lift 50  pounds from overhead to be able to return to work safely    Time  4    Period  Weeks    Status  New      PT LONG TERM GOAL #4   Title  she will be able to lift from floor  50  pounds  to return to work safely    Time  4    Period  Weeks    Status  New  Plan - 03/31/18 1045    Clinical Impression Statement  Pt reports lifting at work and began to have RT sided LBP  initial with radiating pain RT leg but now improved but still with pain.  This is mostly located latea Rt to spin and more in quadratus area today.   She should benefit from skilled PT ro decr spasm/pain and progress HEP and return to work lifitng.    History and Personal Factors relevant to plan of care:  Episod eof back pain earlier last year.    Clinical Presentation  Stable    Clinical Decision Making  Low    Rehab Potential  Good    PT Frequency  2x / week    PT Duration  4 weeks    PT Treatment/Interventions  Passive range of motion;Patient/family education;Therapeutic exercise;Manual techniques;Cryotherapy;Moist Heat;Iontophoresis 4mg /ml  Dexamethasone;Ultrasound;Therapeutic activities    PT Next Visit Plan  Progress HEP , modsalities and manual as needed    PT Home Exercise Plan  childs pose with side bend LT . hamstring stretch,   PPT     Consulted and Agree with Plan of Care  Patient       Patient will benefit from skilled therapeutic intervention in order to improve the following deficits and impairments:  Pain, Decreased activity tolerance, Decreased strength, Increased muscle spasms, Postural dysfunction  Visit Diagnosis: Right-sided low back pain without sciatica, unspecified chronicity - Plan: PT plan of care cert/re-cert  Muscle spasm of back - Plan: PT plan of care cert/re-cert  Abnormal posture - Plan: PT plan of care cert/re-cert     Problem List Patient Active Problem List   Diagnosis Date Noted  . S/P TAH (total abdominal hysterectomy) 03/10/2015  . Vasovagal syncope 07/07/2014  . Essential hypertension 07/07/2014  . Hyperlipidemia 07/07/2014    Darrel Hoover  PT 03/31/2018, 11:47 AM  [facility] [address] [place], [name], [address] Phone: [phone]   Fax:  [phone]  Name: JERIE BASFORD MRN: [id number] Date of Birth: 02-May-1965

## 2018-03-31 NOTE — Patient Instructions (Signed)
Issued from cabinet , child's pose with Lt side bend,   Hamstring stretch, PPT    2-3x/day 2-3 reps   Hold 10-30 sec.

## 2018-04-07 ENCOUNTER — Ambulatory Visit: Payer: PRIVATE HEALTH INSURANCE | Admitting: Physical Therapy

## 2018-04-07 ENCOUNTER — Encounter: Payer: Self-pay | Admitting: Physical Therapy

## 2018-04-07 DIAGNOSIS — M545 Low back pain, unspecified: Secondary | ICD-10-CM

## 2018-04-07 DIAGNOSIS — M62830 Muscle spasm of back: Secondary | ICD-10-CM

## 2018-04-07 DIAGNOSIS — R293 Abnormal posture: Secondary | ICD-10-CM

## 2018-04-07 NOTE — Patient Instructions (Signed)
One Knee   Slide object up one thigh, and hold close at waist level with both hands before standing up.   Copyright  VHI. All rights reserved.  Lifting Principles .Maintain proper posture and head alignment. .Slide object as close as possible before lifting. .Move obstacles out of the way. .Test before lifting; ask for help if too heavy. .Tighten stomach muscles without holding breath. .Use smooth movements; do not jerk. .Use legs to do the work, and pivot with feet. .Distribute the work load symmetrically and close to the center of trunk. .Push instead of pull whenever possible.  Copyright  VHI. All rights reserved.  Deep Squat   Squat and lift with both arms held against upper trunk. Tighten stomach muscles without holding breath. Use smooth movements to avoid jerking.  Copyright  VHI. All rights reserved.  Low Shelf   Squat down, and bring item close to lift.   Copyright  VHI. All rights reserved.

## 2018-04-07 NOTE — Therapy (Signed)
[place], [name], [address] Phone: [phone]   Fax:  [phone]  Physical Therapy Treatment  Patient Details  Name: Heather Rios MRN: [id number] Date of Birth: 11/27/1965 Referring Provider (PT): Odis Luster, MD   Encounter Date: 04/07/2018  PT End of Session - 04/07/18 1501    Visit Number  2    Number of Visits  8    Date for PT Re-Evaluation  04/25/18    Authorization Type  [place] WC, 8 visits authorized    PT Start Time  1502    PT Stop Time  1556    PT Time Calculation (min)  54 min    Activity Tolerance  Patient tolerated treatment well       Past Medical History:  Diagnosis Date  . Allergic rhinitis   . Anemia   . Anginal pain ([place])    Chest Pains 8/16  . Eczema   . Environmental allergies   . GERD (gastroesophageal reflux disease)   . Heart murmur   . High cholesterol   . Hypertension   . Hypoglycemia   . Hypoglycemia   . Plantar fasciitis   . Sinusitis, chronic     Past Surgical History:  Procedure Laterality Date  . ABDOMINAL HYSTERECTOMY N/A 03/10/2015   Procedure: HYSTERECTOMY ABDOMINAL;  Surgeon: Thurnell Lose, MD;  Location: [facility];  Service: Gynecology;  Laterality: N/A;  . BILATERAL SALPINGECTOMY Bilateral 03/10/2015   pt denies  . CHOLECYSTECTOMY    . FOOT SURGERY      There were no vitals filed for this visit.  Subjective Assessment - 04/07/18 1507    Subjective  Pt reports intermittent Rt sided low back pain, especially with lying on that side. Does feel stiffness today.  Still on light duty, sees MD 04/17/2018    Patient Stated Goals  She wants to improve strength.    Decrease pain.    Currently in Pain?  No/denies   only stiffness                      [facility] Adult PT Treatment/Exercise - 04/07/18 0001      Exercises   Exercises  Lumbar      Lumbar Exercises: Stretches   Single Knee to Chest Stretch  Right;3 reps;30 seconds     Lower Trunk Rotation  4 reps;10 seconds   each side   Other Lumbar Stretch Exercise  childs pose straight and with side bend to the left, sink stretch for back      Lumbar Exercises: Aerobic   Nustep  L4 x5' U/LE       Modalities   Modalities  Electrical Stimulation      Electrical Stimulation   Electrical Stimulation Location  Rt low back and upper gluts    Electrical Stimulation Action  IFC    Electrical Stimulation Parameters  to tolerance    Electrical Stimulation Goals  Pain;Tone      Manual Therapy   Manual Therapy  Soft tissue mobilization    Manual therapy comments  skilled palpation during DN , decreased pain with palpation after treatment in the Rt QL and gluts   Soft tissue mobilization  STM to Rt QL, lumbar paraspinals, gluts and piriformis all in s/l        Trigger Point Dry Needling - 04/07/18 1544    Consent Given?  Yes    Education Handout Provided  No   verbally reviewed  Muscles Treated Upper Body  Quadratus Lumborum   Rt    Muscles Treated Lower Body  Gluteus maximus    Gluteus Maximus Response  Palpable increased muscle length;Twitch response elicited   Rt upper                PT Long Term Goals - 04/07/18 1507      PT LONG TERM GOAL #1   Title  She will be independnet with all HEp issued    Status  On-going      PT LONG TERM GOAL #2   Title  she will report pain decr 75% or more    Status  On-going      PT LONG TERM GOAL #3   Title  SHe will be able to lift 50  pounds from overhead to be able to return to work safely    Status  On-going      PT LONG TERM GOAL #4   Title  she will be able to lift from floor  50  pounds  to return to work safely    Status  On-going            Plan - 04/07/18 1546    Clinical Impression Statement  This is Heather Rios' second visit.  She reports intermittent low back pain on the Rt side that is worse with rotation and bending.  Reports doing well with HEP.  She had good releases with DN and  manual work today.  Will need core strengthening after the muscles release in prepation for return to full duty.     Rehab Potential  Good    PT Frequency  2x / week    PT Duration  4 weeks    PT Treatment/Interventions  Passive range of motion;Patient/family education;Therapeutic exercise;Manual techniques;Cryotherapy;Moist Heat;Iontophoresis 4mg /ml Dexamethasone;Ultrasound;Therapeutic activities    PT Next Visit Plan  assess response to DN, educate body mechanics and begin work work    Oncologist with Plan of Care  Patient       Patient will benefit from skilled therapeutic intervention in order to improve the following deficits and impairments:  Pain, Decreased activity tolerance, Decreased strength, Increased muscle spasms, Postural dysfunction  Visit Diagnosis: Right-sided low back pain without sciatica, unspecified chronicity  Muscle spasm of back  Abnormal posture     Problem List Patient Active Problem List   Diagnosis Date Noted  . S/P TAH (total abdominal hysterectomy) 03/10/2015  . Vasovagal syncope 07/07/2014  . Essential hypertension 07/07/2014  . Hyperlipidemia 07/07/2014    Jeral Pinch PT  04/07/2018, 3:49 PM  [facility] [address] [place], [name], [address] Phone: [phone]   Fax:  [phone]  Name: Heather Rios MRN: [id number] Date of Birth: 1965/07/21

## 2018-04-09 ENCOUNTER — Encounter: Payer: Self-pay | Admitting: Physical Therapy

## 2018-04-09 ENCOUNTER — Ambulatory Visit: Payer: PRIVATE HEALTH INSURANCE | Attending: Family Medicine | Admitting: Physical Therapy

## 2018-04-09 DIAGNOSIS — M545 Low back pain, unspecified: Secondary | ICD-10-CM

## 2018-04-09 DIAGNOSIS — R293 Abnormal posture: Secondary | ICD-10-CM | POA: Insufficient documentation

## 2018-04-09 DIAGNOSIS — M62830 Muscle spasm of back: Secondary | ICD-10-CM | POA: Diagnosis not present

## 2018-04-09 NOTE — Patient Instructions (Signed)
TENS UNIT: This is helpful for muscle pain and spasm.   Search and Purchase a TENS 7000 2nd edition at www.tenspros.com. or [employer]  It should be less than $30.     TENS unit instructions: Do not shower or bathe with the unit on Turn the unit off before removing electrodes or batteries If the electrodes lose stickiness add a drop of water to the electrodes after they are disconnected from the unit and place on plastic sheet. If you continued to have difficulty, call the TENS unit company to purchase more electrodes. Do not apply lotion on the skin area prior to use. Make sure the skin is clean and dry as this will help prolong the life of the electrodes. After use, always check skin for unusual red areas, rash or other skin difficulties. If there are any skin problems, does not apply electrodes to the same area. Never remove the electrodes from the unit by pulling the wires. Do not use the TENS unit or electrodes other than as directed. Do not change electrode placement without consultating your therapist or physician. Keep 2 fingers with between each electrode. Wear time ratio is 2:1, on to off times.    For example on for 30 minutes off for 15 minutes and then on for 30 minutes off for 15 minutes

## 2018-04-09 NOTE — Therapy (Signed)
[place], [name], [address] Phone: [phone]   Fax:  [phone]  Physical Therapy Treatment  Patient Details  Name: Heather Rios MRN: [id number] Date of Birth: 09-27-1965 Referring Provider (PT): Odis Luster, MD   Encounter Date: 04/09/2018  PT End of Session - 04/09/18 1104    Visit Number  3    Number of Visits  8    Date for PT Re-Evaluation  04/25/18    Authorization Type  [place] WC, 8 visits authorized    PT Start Time  1105    PT Stop Time  1151    PT Time Calculation (min)  46 min    Activity Tolerance  Patient tolerated treatment well       Past Medical History:  Diagnosis Date  . Allergic rhinitis   . Anemia   . Anginal pain ([place])    Chest Pains 8/16  . Eczema   . Environmental allergies   . GERD (gastroesophageal reflux disease)   . Heart murmur   . High cholesterol   . Hypertension   . Hypoglycemia   . Hypoglycemia   . Plantar fasciitis   . Sinusitis, chronic     Past Surgical History:  Procedure Laterality Date  . ABDOMINAL HYSTERECTOMY N/A 03/10/2015   Procedure: HYSTERECTOMY ABDOMINAL;  Surgeon: Thurnell Lose, MD;  Location: [facility];  Service: Gynecology;  Laterality: N/A;  . BILATERAL SALPINGECTOMY Bilateral 03/10/2015   pt denies  . CHOLECYSTECTOMY    . FOOT SURGERY      There were no vitals filed for this visit.  Subjective Assessment - 04/09/18 1106    Subjective  Pt reports she was sore after the DN, feeling better today    Patient Stated Goals  She wants to improve strength.    Decrease pain.    Currently in Pain?  Yes    Pain Score  3     Pain Location  Back    Pain Orientation  Right                       [facility] Adult PT Treatment/Exercise - 04/09/18 0001      Self-Care   Self-Care  Other Self-Care Comments    Other Self-Care Comments   self trigger point release with tennis ball in supine and on wall.       Lumbar  Exercises: Supine   Ab Set  5 reps;5 seconds   with  PPT   Clam  10 reps   each side with TA contraction     Lumbar Exercises: Prone   Other Prone Lumbar Exercises  10 reps press ups after manual work      Modalities   Modalities  Teacher, English as a foreign language Location  Rt low back and buttocks    Electrical Stimulation Action  IFC    Electrical Stimulation Parameters  to tolerance    Electrical Stimulation Goals  Pain;Tone      Manual Therapy   Manual Therapy  Soft tissue mobilization;Joint mobilization    Joint Mobilization  grade III CPA and Rt UPA mobs in lumbar spine, hypomobile initally, loosened up with reps. HAd shooting stinging pain wiith L 4 UPA mobs on the transvers pillar    Soft tissue mobilization  STM to Rt QL and lumbar paraspinals , less tightness however still with som etrigger points palpable  PT Education - 04/09/18 1139    Education Details  home TENs and self trigger point release using tennis ball    Person(s) Educated  Patient    Methods  Explanation;Handout;Demonstration    Comprehension  Verbal cues required;Verbalized understanding;Returned demonstration          PT Long Term Goals - 04/07/18 1507      PT LONG TERM GOAL #1   Title  She will be independnet with all HEp issued    Status  On-going      PT LONG TERM GOAL #2   Title  she will report pain decr 75% or more    Status  On-going      PT LONG TERM GOAL #3   Title  SHe will be able to lift 50  pounds from overhead to be able to return to work safely    Status  On-going      PT LONG TERM GOAL #4   Title  she will be able to lift from floor  50  pounds  to return to work safely    Status  On-going            Plan - 04/09/18 1140    Clinical Impression Statement  Pt reports some relief after her DN, still has tightness and would benefit from another session of DN.  Her Rt buttock pain flared up with single leg  clam work n hooklying, this settled back down with trigger point release.      Rehab Potential  Good    PT Frequency  2x / week    PT Duration  4 weeks    PT Treatment/Interventions  Passive range of motion;Patient/family education;Therapeutic exercise;Manual techniques;Cryotherapy;Moist Heat;Iontophoresis 4mg /ml Dexamethasone;Ultrasound;Therapeutic activities    PT Next Visit Plan  more DN if still tight, progress HEP to include core stabilzation    Consulted and Agree with Plan of Care  Patient       Patient will benefit from skilled therapeutic intervention in order to improve the following deficits and impairments:  Pain, Decreased activity tolerance, Decreased strength, Increased muscle spasms, Postural dysfunction  Visit Diagnosis: Right-sided low back pain without sciatica, unspecified chronicity  Muscle spasm of back  Abnormal posture     Problem List Patient Active Problem List   Diagnosis Date Noted  . S/P TAH (total abdominal hysterectomy) 03/10/2015  . Vasovagal syncope 07/07/2014  . Essential hypertension 07/07/2014  . Hyperlipidemia 07/07/2014    Jeral Pinch PT  04/09/2018, 11:43 AM  [facility] [address] [place], [name], [address] Phone: [phone]   Fax:  [phone]  Name: Heather Rios MRN: [id number] Date of Birth: November 13, 1965

## 2018-04-10 MED FILL — PRAVASTATIN NA 40 MG TAB: 40 | 90 days supply | Qty: 90 | Fill #1

## 2018-04-15 ENCOUNTER — Encounter: Payer: Self-pay | Admitting: Physical Therapy

## 2018-04-15 ENCOUNTER — Ambulatory Visit: Payer: PRIVATE HEALTH INSURANCE | Admitting: Physical Therapy

## 2018-04-15 DIAGNOSIS — M545 Low back pain, unspecified: Secondary | ICD-10-CM

## 2018-04-15 DIAGNOSIS — M62830 Muscle spasm of back: Secondary | ICD-10-CM

## 2018-04-15 DIAGNOSIS — R293 Abnormal posture: Secondary | ICD-10-CM | POA: Diagnosis not present

## 2018-04-15 NOTE — Therapy (Signed)
Heather Rios, [name], [address] Phone: [phone]   Fax:  [phone]  Physical Therapy Treatment  Patient Details  Name: Heather Rios MRN: [id number] Date of Birth: 12-Apr-1965 Referring Provider (PT): Odis Luster, MD   Encounter Date: 04/15/2018  PT End of Session - 04/15/18 0805    Visit Number  4    Number of Visits  8    Date for PT Re-Evaluation  04/25/18    Authorization Type  [facility], 8 visits authorized    PT Start Time  0805    PT Stop Time  0903    PT Time Calculation (min)  58 min    Activity Tolerance  Patient tolerated treatment well       Past Medical History:  Diagnosis Date  . Allergic rhinitis   . Anemia   . Anginal pain ([place])    Chest Pains 8/16  . Eczema   . Environmental allergies   . GERD (gastroesophageal reflux disease)   . Heart murmur   . High cholesterol   . Hypertension   . Hypoglycemia   . Hypoglycemia   . Plantar fasciitis   . Sinusitis, chronic     Past Surgical History:  Procedure Laterality Date  . ABDOMINAL HYSTERECTOMY N/A 03/10/2015   Procedure: HYSTERECTOMY ABDOMINAL;  Surgeon: Thurnell Lose, MD;  Location: [facility];  Service: Gynecology;  Laterality: N/A;  . BILATERAL SALPINGECTOMY Bilateral 03/10/2015   pt denies  . CHOLECYSTECTOMY    . FOOT SURGERY      There were no vitals filed for this visit.  Subjective Assessment - 04/15/18 0808    Subjective  Pt reports that her weekend was pretty good, woke up around 4AM with Rt sided low back pain and into the hip and groin used a pillow to prop to help     Patient Stated Goals  She wants to improve strength.    Decrease pain.    Pain Score  4     Pain Location  Back    Pain Orientation  Right    Pain Descriptors / Indicators  Aching   deep   Pain Type  Acute pain    Pain Radiating Towards  into Rt groin and buttcok    Pain Onset  More than a month ago    Pain Frequency  Intermittent     Aggravating Factors   alot of bending    Pain Relieving Factors  ice or heat         [facility] PT Assessment - 04/15/18 0001      Assessment   Medical Diagnosis  Lower back pain      AROM   AROM Assessment Site  Lumbar    Lumbar Flexion  3" from the floor - stretching in bilat low back and buttocks    Lumbar Extension  28,pulling Rt low back    Lumbar - Right Side Bend  20    Lumbar - Left Side Bend  23    Lumbar - Right Rotation  75% present with pain    Lumbar - Left Rotation  WNL      Strength   Strength Assessment Site  Lumbar    Lumbar Extension  --   multifidi, strong contraction on Lt, poor on Rt.      Palpation   Palpation comment  Rt QL has less palpable tightness, she does still have a lot of banding and tightness in the Rt  lumbar mulitifidi and paraspinals.                    [facility] Adult PT Treatment/Exercise - 04/15/18 0001      Lumbar Exercises: Stretches   Single Knee to Chest Stretch  2 reps;30 seconds    Double Knee to Chest Stretch  60 seconds    Lower Trunk Rotation  5 reps      Lumbar Exercises: Aerobic   Stationary Bike  L2x5'      Lumbar Exercises: Prone   Other Prone Lumbar Exercises  5 reps pelvic press series per handout       Lumbar Exercises: Quadruped   [place]  10 reps      Modalities   Modalities  Electrical Stimulation;Moist Heat      Moist Heat Therapy   Number Minutes Moist Heat  15 Minutes    Moist Heat Location  Lumbar Spine      Electrical Stimulation   Electrical Stimulation Location  Rt low back and buttocks    Electrical Stimulation Action  IFC    Electrical Stimulation Parameters  to tolerance    Electrical Stimulation Goals  Pain;Tone      Manual Therapy   Manual therapy comments  skilled palpation during DN    Soft tissue mobilization  STM to Rt low back and gluts.        Trigger Point Dry Needling - 04/15/18 7026    Consent Given?  Yes    Education Handout Provided  No    Muscles Treated  Upper Body  Longissimus    Longissimus Response  Palpable increased muscle length;Twitch response elicited   Rt V7-C5, with stim           PT Education - 04/15/18 0820    Education Details  pelvic press routine 5 reps    Person(s) Educated  Patient    Methods  Explanation;Handout    Comprehension  Verbal cues required;Returned demonstration;Verbalized understanding          PT Long Term Goals - 04/15/18 8850      PT LONG TERM GOAL #1   Title  She will be independnet with all HEp issued    Status  On-going      PT LONG TERM GOAL #2   Title  she will report pain decr 75% or more    Baseline  pt reports 50% improvement in pain    Status  On-going      PT LONG TERM GOAL #3   Title  SHe will be able to lift 50  pounds from overhead to be able to return to work safely    Baseline  not attemtped at this point    Status  On-going      PT LONG TERM GOAL #4   Title  she will be able to lift from floor  50  pounds  to return to work safely    Baseline  not attempted at this point    Status  On-going            Plan - 04/15/18 0834    Clinical Impression Statement  Pt is making improvements with pain reduction.  She has less palpable tightness in her Rt QL, todays focus was on the lumbar paraspinals.  She is weak in her deep core muslces and does not have the spinal stability/strength to be lifting heavy loads at this time.  Strength training was initiated today. She is making steady  progress to her goals and participating fully in her HEP     Rehab Potential  Good    PT Frequency  2x / week    PT Duration  4 weeks    PT Treatment/Interventions  Passive range of motion;Patient/family education;Therapeutic exercise;Manual techniques;Cryotherapy;Moist Heat;Iontophoresis 4mg /ml Dexamethasone;Ultrasound;Therapeutic activities    PT Next Visit Plan  assess tolerance to new HEP and progress core strengthening    Consulted and Agree with Plan of Care  Patient       Patient  will benefit from skilled therapeutic intervention in order to improve the following deficits and impairments:  Pain, Decreased activity tolerance, Decreased strength, Increased muscle spasms, Postural dysfunction  Visit Diagnosis: Abnormal posture  Muscle spasm of back  Right-sided low back pain without sciatica, unspecified chronicity     Problem List Patient Active Problem List   Diagnosis Date Noted  . S/P TAH (total abdominal hysterectomy) 03/10/2015  . Vasovagal syncope 07/07/2014  . Essential hypertension 07/07/2014  . Hyperlipidemia 07/07/2014    Jeral Pinch PT 04/15/2018, 12:16 PM  [facility] [address] [place], [name], [address] Phone: [phone]   Fax:  [phone]  Name: RAJANAE MANTIA MRN: [id number] Date of Birth: July 11, 1965

## 2018-04-17 ENCOUNTER — Ambulatory Visit: Payer: PRIVATE HEALTH INSURANCE | Admitting: Physical Therapy

## 2018-04-17 ENCOUNTER — Encounter: Payer: Self-pay | Admitting: Physical Therapy

## 2018-04-17 DIAGNOSIS — M545 Low back pain, unspecified: Secondary | ICD-10-CM

## 2018-04-17 DIAGNOSIS — R293 Abnormal posture: Secondary | ICD-10-CM

## 2018-04-17 DIAGNOSIS — M62830 Muscle spasm of back: Secondary | ICD-10-CM

## 2018-04-17 NOTE — Therapy (Signed)
[name], [name], [address] Phone: [phone]   Fax:  [phone]  Physical Therapy Treatment  Patient Details  Name: Heather Rios MRN: [id number] Date of Birth: Oct 10, 1965 Referring Provider (PT): Heather Luster, MD   Encounter Date: 04/17/2018  PT End of Session - 04/17/18 1758    Visit Number  5    Number of Visits  8    Date for PT Re-Evaluation  04/25/18    Authorization Type  [facility], 8 visits authorized    PT Start Time  1715    PT Stop Time  1803    PT Time Calculation (min)  48 min    Activity Tolerance  Patient tolerated treatment well    Behavior During Therapy  [facility] for tasks assessed/performed       Past Medical History:  Diagnosis Date  . Allergic rhinitis   . Anemia   . Anginal pain ([place])    Chest Pains 8/16  . Eczema   . Environmental allergies   . GERD (gastroesophageal reflux disease)   . Heart murmur   . High cholesterol   . Hypertension   . Hypoglycemia   . Hypoglycemia   . Plantar fasciitis   . Sinusitis, chronic     Past Surgical History:  Procedure Laterality Date  . ABDOMINAL HYSTERECTOMY N/A 03/10/2015   Procedure: HYSTERECTOMY ABDOMINAL;  Surgeon: Heather Lose, MD;  Location: [facility];  Service: Gynecology;  Laterality: N/A;  . BILATERAL SALPINGECTOMY Bilateral 03/10/2015   pt denies  . CHOLECYSTECTOMY    . FOOT SURGERY      There were no vitals filed for this visit.  Subjective Assessment - 04/17/18 1753    Subjective  Pt relays she saw [facility] MD who wanted to progress her lifting restrictions from 10 lbs to 25 lbs. She still complains of pain and relays that there is no in between from her light duty work to her normal duty work which is lifting up to 50 lbs frequently for 8 hours.     Currently in Pain?  Yes    Pain Score  3     Pain Location  Back    Pain Orientation  Right    Pain Descriptors / Indicators  Aching    Pain Type  Acute pain                       [facility] Adult PT Treatment/Exercise - 04/17/18 0001      Lumbar Exercises: Stretches   Single Knee to Chest Stretch  2 reps;30 seconds    Double Knee to Chest Stretch  60 seconds      Lumbar Exercises: Aerobic   Stationary Bike  L2x5'      Lumbar Exercises: Standing   Row  Strengthening;20 reps    Theraband Level (Row)  Level 3 (Green)    Shoulder Extension  Strengthening;20 reps    Theraband Level (Shoulder Extension)  Level 3 (Green)    Other Standing Lumbar Exercises  lifting mechanics picking up Pball from floor to waist X 10      Lumbar Exercises: Supine   Bridge  15 reps;5 seconds    Other Supine Lumbar Exercises  marches with core, X 15 bilat      Modalities   Modalities  Electrical Stimulation;Moist Heat      Moist Heat Therapy   Number Minutes Moist Heat  15 Minutes    Moist Heat  Location  Lumbar Spine      Electrical Stimulation   Electrical Stimulation Location  Rt low back and buttocks    Electrical Stimulation Action  IFC    Electrical Stimulation Parameters  tolerance    Electrical Stimulation Goals  Pain;Tone      Manual Therapy   Soft tissue mobilization  STM to Rt low back and gluts with biofreeze             PT Education - 04/17/18 1758    Education Details  lifting mechanics    Person(s) Educated  Patient    Methods  Explanation;Demonstration;Verbal cues    Comprehension  Verbalized understanding;Returned demonstration          PT Long Term Goals - 04/15/18 4627      PT LONG TERM GOAL #1   Title  She will be independnet with all HEp issued    Status  On-going      PT LONG TERM GOAL #2   Title  she will report pain decr 75% or more    Baseline  pt reports 50% improvement in pain    Status  On-going      PT LONG TERM GOAL #3   Title  SHe will be able to lift 50  pounds from overhead to be able to return to work safely    Baseline  not attemtped at this point    Status  On-going      PT LONG TERM  GOAL #4   Title  she will be able to lift from floor  50  pounds  to return to work safely    Baseline  not attempted at this point    Status  On-going            Plan - 04/17/18 1759    Clinical Impression Statement  Session focused on lumbar ROM, lumbar and core stablization, and lifting mechanics with good tolerance. This was followed by STM with biofreeze to reduce ST restrictions and pain and sesson ended with MHP and TENS to reduce pain and tone. PT will continue to progress functional lifting as albe    Rehab Potential  Good    PT Frequency  2x / week    PT Duration  4 weeks    PT Treatment/Interventions  Passive range of motion;Patient/family education;Therapeutic exercise;Manual techniques;Cryotherapy;Moist Heat;Iontophoresis 4mg /ml Dexamethasone;Ultrasound;Therapeutic activities    PT Next Visit Plan  assess tolerance to new HEP and progress core strengthening and functional lifting    PT Home Exercise Plan  childs pose with side bend LT . hamstring stretch,   PPT     Consulted and Agree with Plan of Care  Patient       Patient will benefit from skilled therapeutic intervention in order to improve the following deficits and impairments:  Pain, Decreased activity tolerance, Decreased strength, Increased muscle spasms, Postural dysfunction  Visit Diagnosis: Abnormal posture  Muscle spasm of back  Right-sided low back pain without sciatica, unspecified chronicity     Problem List Patient Active Problem List   Diagnosis Date Noted  . S/P TAH (total abdominal hysterectomy) 03/10/2015  . Vasovagal syncope 07/07/2014  . Essential hypertension 07/07/2014  . Hyperlipidemia 07/07/2014    Heather Rios 04/17/2018, 6:02 PM  [facility] [address] [place], [name], [address] Phone: [phone]   Fax:  [phone]  Name: Heather Rios MRN: [id number] Date of Birth: [birth year]

## 2018-04-22 ENCOUNTER — Encounter: Payer: Self-pay | Admitting: Physical Therapy

## 2018-04-22 ENCOUNTER — Ambulatory Visit: Payer: PRIVATE HEALTH INSURANCE | Attending: Family Medicine | Admitting: Physical Therapy

## 2018-04-22 DIAGNOSIS — M545 Low back pain, unspecified: Secondary | ICD-10-CM

## 2018-04-22 DIAGNOSIS — R293 Abnormal posture: Secondary | ICD-10-CM | POA: Diagnosis not present

## 2018-04-22 DIAGNOSIS — M62830 Muscle spasm of back: Secondary | ICD-10-CM | POA: Diagnosis present

## 2018-04-23 ENCOUNTER — Encounter: Payer: Self-pay | Admitting: Physical Therapy

## 2018-04-23 ENCOUNTER — Ambulatory Visit: Payer: PRIVATE HEALTH INSURANCE | Admitting: Physical Therapy

## 2018-04-23 DIAGNOSIS — M62830 Muscle spasm of back: Secondary | ICD-10-CM

## 2018-04-23 DIAGNOSIS — M545 Low back pain, unspecified: Secondary | ICD-10-CM

## 2018-04-23 DIAGNOSIS — R293 Abnormal posture: Secondary | ICD-10-CM | POA: Diagnosis not present

## 2018-04-23 NOTE — Therapy (Signed)
[place], [name], [address] Phone: [phone]   Fax:  [phone]  Physical Therapy Treatment  Patient Details  Name: Heather Rios MRN: [id number] Date of Birth: October 16, 1965 Referring Provider (PT): Odis Luster, MD   Encounter Date: 04/22/2018  PT End of Session - 04/23/18 1313    Visit Number  6    Number of Visits  8    Date for PT Re-Evaluation  04/25/18    Authorization Type  [facility], 8 visits authorized    PT Start Time  1632    PT Stop Time  1725    PT Time Calculation (min)  53 min    Activity Tolerance  Patient tolerated treatment well    Behavior During Therapy  [facility] for tasks assessed/performed       Past Medical History:  Diagnosis Date  . Allergic rhinitis   . Anemia   . Anginal pain ([place])    Chest Pains 8/16  . Eczema   . Environmental allergies   . GERD (gastroesophageal reflux disease)   . Heart murmur   . High cholesterol   . Hypertension   . Hypoglycemia   . Hypoglycemia   . Plantar fasciitis   . Sinusitis, chronic     Past Surgical History:  Procedure Laterality Date  . ABDOMINAL HYSTERECTOMY N/A 03/10/2015   Procedure: HYSTERECTOMY ABDOMINAL;  Surgeon: Thurnell Lose, MD;  Location: [facility];  Service: Gynecology;  Laterality: N/A;  . BILATERAL SALPINGECTOMY Bilateral 03/10/2015   pt denies  . CHOLECYSTECTOMY    . FOOT SURGERY      There were no vitals filed for this visit.  Subjective Assessment - 04/23/18 1305    Subjective  Patient reports at times the pain is better but st times it is sore. She is having some soreness today. She feels like her pain is running along her PSIS.     Limitations  Lifting    How long can you sit comfortably?  Some times has pain.    Lying also causes pain    How long can you stand comfortably?  60 min    How long can you walk comfortably?  60 min    Diagnostic tests  none    Patient Stated Goals  She wants to improve  strength.    Decrease pain.    Currently in Pain?  Yes    Pain Score  4     Pain Location  Back    Pain Orientation  Right    Pain Descriptors / Indicators  Aching    Pain Type  Acute pain    Pain Radiating Towards  into right groin and buttock     Pain Onset  More than a month ago    Aggravating Factors   allot of bending     Pain Relieving Factors  ice and heat     Effect of Pain on Daily Activities  Pain with work                        [facility] Adult PT Treatment/Exercise - 04/23/18 0001      Lumbar Exercises: Stretches   Single Knee to Chest Stretch  2 reps;30 seconds    Lower Trunk Rotation  30 seconds    Piriformis Stretch Limitations  3x20 sec hold bilateral       Lumbar Exercises: Aerobic   Stationary Bike  L2x5'  Lumbar Exercises: Supine   Bridge  15 reps;5 seconds    Other Supine Lumbar Exercises  marches with core, X 15 bilat    Other Supine Lumbar Exercises  clam shell with red band       Moist Heat Therapy   Number Minutes Moist Heat  10 Minutes    Moist Heat Location  Lumbar Spine      Electrical Stimulation   Electrical Stimulation Location  Rt low back and buttocks    Electrical Stimulation Goals  Pain;Tone      Manual Therapy   Manual therapy comments  LAD 3x30 sec bilateral     Soft tissue mobilization  STM to Rt low back and gluts with biofreeze using IASTYM              PT Education - 04/23/18 1311    Education Details  lifting mechanics; benefits and risks of TPDN     Person(s) Educated  Patient    Methods  Explanation;Tactile cues;Demonstration    Comprehension  Verbalized understanding;Returned demonstration;Verbal cues required;Tactile cues required          PT Long Term Goals - 04/23/18 1318      PT LONG TERM GOAL #1   Title  She will be independnet with all HEp issued    Time  4    Period  Weeks    Status  Achieved      PT LONG TERM GOAL #2   Title  she will report pain decr 75% or more    Baseline  pt  reports 50% improvement in pain    Time  4    Period  Weeks    Status  On-going      PT LONG TERM GOAL #3   Title  SHe will be able to lift 50  pounds from overhead to be able to return to work safely    Baseline  lifting restrictions at 25lbs     Time  4    Period  Weeks    Status  On-going      PT LONG TERM GOAL #4   Title  she will be able to lift from floor  50  pounds  to return to work safely    Baseline  25lb lifting restrictions     Time  4    Period  Weeks    Status  On-going            Plan - 04/23/18 1316    Clinical Impression Statement  Patient had a great twitch respose with dry needling. She reported feeling soreness after but overall was doing well. Patient will continue to progress patient as tolerated. Therapy reviewed exercises to help with post needle soreness and stretches she can do at work.     Clinical Presentation  Stable    Clinical Decision Making  Low    Rehab Potential  Good    PT Frequency  2x / week    PT Duration  4 weeks    PT Treatment/Interventions  Passive range of motion;Patient/family education;Therapeutic exercise;Manual techniques;Cryotherapy;Moist Heat;Iontophoresis 4mg /ml Dexamethasone;Ultrasound;Therapeutic activities    PT Next Visit Plan  assess tolerance to new HEP and progress core strengthening and functional lifting    PT Home Exercise Plan  childs pose with side bend LT . hamstring stretch,   PPT     Consulted and Agree with Plan of Care  Patient       Patient will benefit from skilled therapeutic intervention  in order to improve the following deficits and impairments:  Pain, Decreased activity tolerance, Decreased strength, Increased muscle spasms, Postural dysfunction  Visit Diagnosis: Abnormal posture  Muscle spasm of back  Right-sided low back pain without sciatica, unspecified chronicity     Problem List Patient Active Problem List   Diagnosis Date Noted  . S/P TAH (total abdominal hysterectomy) 03/10/2015   . Vasovagal syncope 07/07/2014  . Essential hypertension 07/07/2014  . Hyperlipidemia 07/07/2014    Carney Living PT DPT  04/23/2018, 1:21 PM  [facility] [address] [place], [name], [address] Phone: [phone]   Fax:  [phone]  Name: ALDEAN SUDDETH MRN: [id number] Date of Birth: 1965/08/22

## 2018-04-24 ENCOUNTER — Encounter: Payer: Self-pay | Admitting: Physical Therapy

## 2018-04-24 NOTE — Therapy (Signed)
[place], [name], [address] Phone: [phone]   Fax:  [phone]  Physical Therapy Treatment  Patient Details  Name: Heather Rios MRN: [id number] Date of Birth: [birth year] Referring Provider (PT): Odis Luster, MD   Encounter Date: 04/23/2018  PT End of Session - 04/24/18 1425    Visit Number  7    Number of Visits  8    Date for PT Re-Evaluation  04/25/18    Authorization Type  [place] [facility], 8 visits authorized    PT Start Time  1635    PT Stop Time  1717    PT Time Calculation (min)  42 min    Activity Tolerance  Patient tolerated treatment well    Behavior During Therapy  [facility] for tasks assessed/performed       Past Medical History:  Diagnosis Date  . Allergic rhinitis   . Anemia   . Anginal pain ([place])    Chest Pains 8/16  . Eczema   . Environmental allergies   . GERD (gastroesophageal reflux disease)   . Heart murmur   . High cholesterol   . Hypertension   . Hypoglycemia   . Hypoglycemia   . Plantar fasciitis   . Sinusitis, chronic     Past Surgical History:  Procedure Laterality Date  . ABDOMINAL HYSTERECTOMY N/A 03/10/2015   Procedure: HYSTERECTOMY ABDOMINAL;  Surgeon: Thurnell Lose, MD;  Location: [facility];  Service: Gynecology;  Laterality: N/A;  . BILATERAL SALPINGECTOMY Bilateral 03/10/2015   pt denies  . CHOLECYSTECTOMY    . FOOT SURGERY      There were no vitals filed for this visit.  Subjective Assessment - 04/23/18 1642    Subjective  Patient reports she was a little sore today but overall it is aot as bad. She has been working all day. She was a little sore after the last visit but not too bad.     How long can you sit comfortably?  Some times has pain.    Lying also causes pain    How long can you stand comfortably?  60 min    How long can you walk comfortably?  60 min    Diagnostic tests  none    Patient Stated Goals  She wants to improve strength.     Decrease pain.    Currently in Pain?  Yes    Pain Score  4     Pain Location  Back    Pain Orientation  Right    Pain Descriptors / Indicators  Aching    Pain Type  Acute pain    Pain Onset  More than a month ago    Pain Frequency  Constant    Aggravating Factors   bending and lifting     Pain Relieving Factors  ice and heat     Effect of Pain on Daily Activities  pain with work                        [facility] Adult PT Treatment/Exercise - 04/24/18 0001      Self-Care   Other Self-Care Comments   15 lb lift form table with proper hp hinge. ModVC's at first then less cuing as she practiced       Lumbar Exercises: Stretches   Single Knee to Chest Stretch  2 reps;30 seconds    Lower Trunk Rotation  30 seconds    Piriformis Stretch  Limitations  3x20 sec hold bilateral       Lumbar Exercises: Aerobic   Stationary Bike  L2x5'      Lumbar Exercises: Supine   Clam Limitations  2x10 green     Bent Knee Raise Limitations  2x10     Bridge Limitations  x10      Moist Heat Therapy   Number Minutes Moist Heat  10 Minutes      Electrical Stimulation   Electrical Stimulation Location  Rt low back and buttocks    Electrical Stimulation Action  IFC     Electrical Stimulation Parameters  to lumbar spine     Electrical Stimulation Goals  Pain;Tone      Manual Therapy   Manual therapy comments  LAD 3x30 sec bilateral     Soft tissue mobilization  STM to Rt low back and gluts with biofreeze using IASTYM              PT Education - 04/24/18 1424    Education Details  HEP, symptom mangement     Person(s) Educated  Patient    Methods  Explanation;Demonstration;Tactile cues;Verbal cues    Comprehension  Verbalized understanding;Returned demonstration;Verbal cues required;Tactile cues required          PT Long Term Goals - 04/23/18 1318      PT LONG TERM GOAL #1   Title  She will be independnet with all HEp issued    Time  4    Period  Weeks    Status  Achieved       PT LONG TERM GOAL #2   Title  she will report pain decr 75% or more    Baseline  pt reports 50% improvement in pain    Time  4    Period  Weeks    Status  On-going      PT LONG TERM GOAL #3   Title  SHe will be able to lift 50  pounds from overhead to be able to return to work safely    Baseline  lifting restrictions at 25lbs     Time  4    Period  Weeks    Status  On-going      PT LONG TERM GOAL #4   Title  she will be able to lift from floor  50  pounds  to return to work safely    Baseline  25lb lifting restrictions     Time  4    Period  Weeks    Status  On-going            Plan - 04/24/18 1426    Clinical Impression Statement  Patient is making progress. Patient had less tenderness to palpation today. She had less of a spasm. She was able to complete exercises without pain. 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 With verbal cuing she was able to perfrom proper suqat and lift.     Clinical Presentation  Stable    Clinical Decision Making  Low    Rehab Potential  Good    PT Frequency  2x / week    PT Duration  4 weeks    PT Treatment/Interventions  Passive range of motion;Patient/family education;Therapeutic exercise;Manual techniques;Cryotherapy;Moist Heat;Iontophoresis 4mg /ml Dexamethasone;Ultrasound;Therapeutic activities    PT Next Visit Plan  assess tolerance to new HEP and progress core strengthening and functional lifting    PT Home Exercise Plan  childs pose with side bend LT . hamstring stretch,   PPT  Consulted and Agree with Plan of Care  Patient       Patient will benefit from skilled therapeutic intervention in order to improve the following deficits and impairments:  Pain, Decreased activity tolerance, Decreased strength, Increased muscle spasms, Postural dysfunction  Visit Diagnosis: Abnormal posture  Muscle spasm of back  Right-sided low back pain without sciatica, unspecified chronicity     Problem List Patient Active Problem  List   Diagnosis Date Noted  . S/P TAH (total abdominal hysterectomy) 03/10/2015  . Vasovagal syncope 07/07/2014  . Essential hypertension 07/07/2014  . Hyperlipidemia 07/07/2014    Carney Living PT DPT  04/24/2018, 2:38 PM  [facility] [address] [place], [name], [address] Phone: [phone]   Fax:  [phone]  Name: PATT STEINHARDT MRN: [id number] Date of Birth: 1965/10/07

## 2018-04-28 ENCOUNTER — Encounter: Payer: Self-pay | Admitting: Physical Therapy

## 2018-04-28 ENCOUNTER — Ambulatory Visit: Payer: PRIVATE HEALTH INSURANCE | Admitting: Physical Therapy

## 2018-04-28 DIAGNOSIS — M545 Low back pain, unspecified: Secondary | ICD-10-CM

## 2018-04-28 DIAGNOSIS — R293 Abnormal posture: Secondary | ICD-10-CM

## 2018-04-28 DIAGNOSIS — M62830 Muscle spasm of back: Secondary | ICD-10-CM

## 2018-04-29 ENCOUNTER — Encounter: Payer: Self-pay | Admitting: Physical Therapy

## 2018-04-29 NOTE — Therapy (Signed)
[place], [name], [address] Phone: [phone]   Fax:  [phone]  Physical Therapy Treatment  Patient Details  Name: Heather Rios MRN: [id number] Date of Birth: 1965/08/13 Referring Provider (PT): Odis Luster, MD   Encounter Date: 04/28/2018  PT End of Session - 04/28/18 1652    Visit Number  8    Number of Visits  9    Date for PT Re-Evaluation  04/25/18    Authorization Type  [place] [facility], 8 visits authorized    PT Start Time  1630    PT Stop Time  1724    PT Time Calculation (min)  54 min    Activity Tolerance  Patient tolerated treatment well    Behavior During Therapy  [facility] for tasks assessed/performed       Past Medical History:  Diagnosis Date  . Allergic rhinitis   . Anemia   . Anginal pain ([place])    Chest Pains 8/16  . Eczema   . Environmental allergies   . GERD (gastroesophageal reflux disease)   . Heart murmur   . High cholesterol   . Hypertension   . Hypoglycemia   . Hypoglycemia   . Plantar fasciitis   . Sinusitis, chronic     Past Surgical History:  Procedure Laterality Date  . ABDOMINAL HYSTERECTOMY N/A 03/10/2015   Procedure: HYSTERECTOMY ABDOMINAL;  Surgeon: Thurnell Lose, MD;  Location: [facility];  Service: Gynecology;  Laterality: N/A;  . BILATERAL SALPINGECTOMY Bilateral 03/10/2015   pt denies  . CHOLECYSTECTOMY    . FOOT SURGERY      There were no vitals filed for this visit.  Subjective Assessment - 04/28/18 1634    Subjective  Patient repacxed ovcer the weekend. She feels like the weather has made her a little stiff and sore. She reports no pain just stiffness that is moving around her back     How long can you sit comfortably?  Some times has pain.    Lying also causes pain    How long can you stand comfortably?  60 min    How long can you walk comfortably?  60 min    Diagnostic tests  none    Patient Stated Goals  She wants to improve strength.     Decrease pain.    Currently in Pain?  No/denies                       [facility] Adult PT Treatment/Exercise - 04/29/18 0001      Lumbar Exercises: Stretches   Single Knee to Chest Stretch  2 reps;30 seconds    Lower Trunk Rotation  30 seconds    Piriformis Stretch Limitations  3x20 sec hold bilateral       Lumbar Exercises: Aerobic   Stationary Bike  L3x5'      Lumbar Exercises: Supine   Clam Limitations  2x10 green     Bent Knee Raise Limitations  2x10     Bridge Limitations  2x10      Moist Heat Therapy   Number Minutes Moist Heat  12 Minutes    Moist Heat Location  Lumbar Spine      Electrical Stimulation   Electrical Stimulation Location  Rt low back and buttocks    Electrical Stimulation Action  IFC     Electrical Stimulation Parameters  to lumbarspine     Electrical Stimulation Goals  Pain;Tone  Manual Therapy   Manual therapy comments  LAD 3x30 sec bilateral     Soft tissue mobilization  STM to Rt low back and gluts with biofreeze using IASTYM              PT Education - 04/28/18 1651    Education Details  tehcnoque with stretching     Person(s) Educated  Patient    Methods  Explanation;Tactile cues;Demonstration;Verbal cues    Comprehension  Verbalized understanding;Returned demonstration;Verbal cues required;Tactile cues required;Need further instruction          PT Long Term Goals - 04/23/18 1318      PT LONG TERM GOAL #1   Title  She will be independnet with all HEp issued    Time  4    Period  Weeks    Status  Achieved      PT LONG TERM GOAL #2   Title  she will report pain decr 75% or more    Baseline  pt reports 50% improvement in pain    Time  4    Period  Weeks    Status  On-going      PT LONG TERM GOAL #3   Title  SHe will be able to lift 50  pounds from overhead to be able to return to work safely    Baseline  lifting restrictions at 25lbs     Time  4    Period  Weeks    Status  On-going      PT LONG TERM GOAL  #4   Title  she will be able to lift from floor  50  pounds  to return to work safely    Baseline  25lb lifting restrictions     Time  4    Period  Weeks    Status  On-going            Plan - 04/28/18 1658    Clinical Impression Statement  Patient declined needling today despite tightness. She ahad ,ild spasming in her right lumbar paraspinals but less spasming in her gluteals. She was show a prayer stretch and lateral prayer stretch for home. She had no increase in pain with treatment. Patient has 1 more visit left. If she has to go back to a job where she is ligfitng 50lbs she needs to work on more strengthening and lifting technique. If she is going to a less strenuos job she can likley take her HEP and continue to work on it.     Clinical Presentation  Stable    Clinical Decision Making  Low    Rehab Potential  Good    PT Frequency  2x / week    PT Duration  4 weeks    PT Treatment/Interventions  Passive range of motion;Patient/family education;Therapeutic exercise;Manual techniques;Cryotherapy;Moist Heat;Iontophoresis 4mg /ml Dexamethasone;Ultrasound;Therapeutic activities    PT Next Visit Plan  assess tolerance to new HEP and progress core strengthening and functional lifting    PT Home Exercise Plan  childs pose with side bend LT . hamstring stretch,   PPT     Consulted and Agree with Plan of Care  Patient       Patient will benefit from skilled therapeutic intervention in order to improve the following deficits and impairments:  Pain, Decreased activity tolerance, Decreased strength, Increased muscle spasms, Postural dysfunction  Visit Diagnosis: Abnormal posture  Muscle spasm of back  Right-sided low back pain without sciatica, unspecified chronicity     Problem List Patient  Active Problem List   Diagnosis Date Noted  . S/P TAH (total abdominal hysterectomy) 03/10/2015  . Vasovagal syncope 07/07/2014  . Essential hypertension 07/07/2014  . Hyperlipidemia  07/07/2014    Carney Living PT DPT  04/29/2018, 2:47 PM  [facility] [address] [place], [name], [address] Phone: [phone]   Fax:  [phone]  Name: Heather Rios MRN: [id number] Date of Birth: 01-31-1966

## 2018-04-30 ENCOUNTER — Ambulatory Visit: Payer: PRIVATE HEALTH INSURANCE | Admitting: Physical Therapy

## 2018-04-30 DIAGNOSIS — M545 Low back pain, unspecified: Secondary | ICD-10-CM

## 2018-04-30 DIAGNOSIS — R293 Abnormal posture: Secondary | ICD-10-CM

## 2018-04-30 DIAGNOSIS — M62830 Muscle spasm of back: Secondary | ICD-10-CM

## 2018-04-30 MED FILL — IRBESARTAN 300 MG TAB: 300 | 30 days supply | Qty: 30 | Fill #1

## 2018-05-01 ENCOUNTER — Encounter: Payer: Self-pay | Admitting: Physical Therapy

## 2018-05-01 NOTE — Therapy (Signed)
[place], [name], [address] Phone: [phone]   Fax:  [phone]  Physical Therapy Treatment  Patient Details  Name: Heather Rios MRN: [id number] Date of Birth: [birth year] Referring Provider (PT): Odis Luster, MD   Encounter Date: 04/30/2018  PT End of Session - 04/30/18 1641    Visit Number  9    Number of Visits  9    Date for PT Re-Evaluation  04/25/18    Authorization Type   WC, 8 visits authorized    PT Start Time  1632    PT Stop Time  1722    PT Time Calculation (min)  50 min    Activity Tolerance  Patient tolerated treatment well    Behavior During Therapy  [facility] for tasks assessed/performed       Past Medical History:  Diagnosis Date  . Allergic rhinitis   . Anemia   . Anginal pain ([place])    Chest Pains 8/16  . Eczema   . Environmental allergies   . GERD (gastroesophageal reflux disease)   . Heart murmur   . High cholesterol   . Hypertension   . Hypoglycemia   . Hypoglycemia   . Plantar fasciitis   . Sinusitis, chronic     Past Surgical History:  Procedure Laterality Date  . ABDOMINAL HYSTERECTOMY N/A 03/10/2015   Procedure: HYSTERECTOMY ABDOMINAL;  Surgeon: Thurnell Lose, MD;  Location: [facility];  Service: Gynecology;  Laterality: N/A;  . BILATERAL SALPINGECTOMY Bilateral 03/10/2015   pt denies  . CHOLECYSTECTOMY    . FOOT SURGERY      There were no vitals filed for this visit.  Subjective Assessment - 04/30/18 1638    Subjective  Patient reported that she bent over today and felt a pull. She reports pain in her right lumbar spine. It has been sore all day.     Limitations  Lifting    How long can you sit comfortably?  Some times has pain.    Lying also causes pain    How long can you stand comfortably?  60 min    How long can you walk comfortably?  60 min    Diagnostic tests  none    Patient Stated Goals  She wants to improve strength.    Decrease  pain.    Currently in Pain?  Yes    Pain Score  6     Pain Location  Back    Pain Orientation  Right    Pain Descriptors / Indicators  Aching    Pain Type  Acute pain    Pain Radiating Towards  mild radiation into her buttock     Pain Onset  More than a month ago    Pain Frequency  Constant    Aggravating Factors   bending and lifting     Pain Relieving Factors  ice and heat     Effect of Pain on Daily Activities  pain with work                        [facility] Adult PT Treatment/Exercise - 05/01/18 0001      Lumbar Exercises: Stretches   Single Knee to Chest Stretch  2 reps;30 seconds    Lower Trunk Rotation  30 seconds    Piriformis Stretch Limitations  3x20 sec hold bilateral       Lumbar Exercises: Aerobic   Stationary Bike  L3x5'  Moist Heat Therapy   Number Minutes Moist Heat  12 Minutes    Moist Heat Location  Lumbar Spine      Electrical Stimulation   Electrical Stimulation Location  Rt low back and buttocks    Electrical Stimulation Action  iFC    Electrical Stimulation Parameters  to lumbar spine    Electrical Stimulation Goals  Pain;Tone      Manual Therapy   Manual therapy comments  LAD 3x30 sec bilateral     Joint Mobilization  PA glidesmL4/L5     Soft tissue mobilization  STM to Rt low back and gluts with biofreeze using IASTYM              PT Education - 04/30/18 1640    Education Details  reviewded tehcnique with HEP     Person(s) Educated  Patient    Methods  Explanation;Demonstration;Tactile cues;Verbal cues    Comprehension  Verbalized understanding;Verbal cues required;Tactile cues required;Returned demonstration          PT Long Term Goals - 05/01/18 0843      PT LONG TERM GOAL #1   Title  She will be independnet with all HEp issued    Baseline  Indpendent with HEP     Time  4    Period  Weeks    Status  Achieved      PT LONG TERM GOAL #2   Title  she will report pain decr 75% or more    Baseline  had improved  until recent setback     Time  4      PT LONG TERM GOAL #3   Title  SHe will be able to lift 50  pounds from overhead to be able to return to work safely    Baseline  15 lbs comfrtably     Time  4    Period  Weeks    Status  On-going      PT LONG TERM GOAL #4   Title  she will be able to lift from floor  50  pounds  to return to work safely    Baseline  15lbs at this time     Time  4    Period  Weeks    Status  On-going            Plan - 05/01/18 0840    Clinical Impression Statement  Patient had been making good progress until this visit. She has progressed to lifting 15lbs. She hwas having very little pain aftetrthe last visit but now heer pain is back to a 7/10. Therapy focused on manual therapy to reduce pain and spasming this visit. she reported improved paim. Her FOTO score hasdecreased. Her lumbar rotation to the right has improved 50%. If she is goign back to a job where she needs to lift 50lbs she may require more therapy. Rennis Chris will put her on hold until we talk to her case manager and MD. She is applying for a job where she has to lift less. Therapy reviewed stretches and exercises to perfrom at home in the meantime.     Clinical Presentation  Stable    Clinical Decision Making  Low    Rehab Potential  Good    PT Frequency  2x / week    PT Duration  4 weeks    PT Treatment/Interventions  Passive range of motion;Patient/family education;Therapeutic exercise;Manual techniques;Cryotherapy;Moist Heat;Iontophoresis 4mg /ml Dexamethasone;Ultrasound;Therapeutic activities    PT Next Visit Plan  assess tolerance  to new HEP and progress core strengthening and functional lifting    PT Home Exercise Plan  childs pose with side bend LT . hamstring stretch,   PPT     Consulted and Agree with Plan of Care  Patient       Patient will benefit from skilled therapeutic intervention in order to improve the following deficits and impairments:  Pain, Decreased activity tolerance,  Decreased strength, Increased muscle spasms, Postural dysfunction  Visit Diagnosis: Abnormal posture  Muscle spasm of back  Right-sided low back pain without sciatica, unspecified chronicity     Problem List Patient Active Problem List   Diagnosis Date Noted  . S/P TAH (total abdominal hysterectomy) 03/10/2015  . Vasovagal syncope 07/07/2014  . Essential hypertension 07/07/2014  . Hyperlipidemia 07/07/2014    Rachael Darby DPT  05/01/2018, 12:13 PM  [facility] [address] [place], [name], [address] Phone: [phone]   Fax:  [phone]  Name: MCKINZEY ENTWISTLE MRN: [id number] Date of Birth: 1965-04-21

## 2018-05-05 DIAGNOSIS — J329 Chronic sinusitis, unspecified: Secondary | ICD-10-CM | POA: Diagnosis not present

## 2018-05-05 MED FILL — DOXYCYCLINE HYC 100 MG CAPS: 100 | 10 days supply | Qty: 20 | Fill #0

## 2018-05-05 MED FILL — SM BLOOD PRESSURE MONITOR: 30 days supply | Qty: 1 | Fill #0

## 2018-05-06 ENCOUNTER — Ambulatory Visit: Payer: PRIVATE HEALTH INSURANCE | Admitting: Physical Therapy

## 2018-05-06 DIAGNOSIS — M545 Low back pain, unspecified: Secondary | ICD-10-CM

## 2018-05-06 DIAGNOSIS — R293 Abnormal posture: Secondary | ICD-10-CM | POA: Diagnosis not present

## 2018-05-06 DIAGNOSIS — M62830 Muscle spasm of back: Secondary | ICD-10-CM

## 2018-05-07 ENCOUNTER — Encounter: Payer: Self-pay | Admitting: Physical Therapy

## 2018-05-07 NOTE — Therapy (Signed)
[place], [name], [address] Phone: [phone]   Fax:  [phone]  Physical Therapy Treatment  Patient Details  Name: Heather Rios MRN: [id number] Date of Birth: 10-27-1965 Referring Provider (PT): Odis Luster, MD   Encounter Date: 05/06/2018  PT End of Session - 05/06/18 1508    Visit Number  10    Number of Visits  10    Date for PT Re-Evaluation  04/25/18    Authorization Type  [place] WC, 8 visits authorized    PT Start Time  1502    PT Stop Time  1544    PT Time Calculation (min)  42 min    Activity Tolerance  Patient tolerated treatment well    Behavior During Therapy  [facility] for tasks assessed/performed       Past Medical History:  Diagnosis Date  . Allergic rhinitis   . Anemia   . Anginal pain ([place])    Chest Pains 8/16  . Eczema   . Environmental allergies   . GERD (gastroesophageal reflux disease)   . Heart murmur   . High cholesterol   . Hypertension   . Hypoglycemia   . Hypoglycemia   . Plantar fasciitis   . Sinusitis, chronic     Past Surgical History:  Procedure Laterality Date  . ABDOMINAL HYSTERECTOMY N/A 03/10/2015   Procedure: HYSTERECTOMY ABDOMINAL;  Surgeon: Thurnell Lose, MD;  Location: [facility];  Service: Gynecology;  Laterality: N/A;  . BILATERAL SALPINGECTOMY Bilateral 03/10/2015   pt denies  . CHOLECYSTECTOMY    . FOOT SURGERY      There were no vitals filed for this visit.  Subjective Assessment - 05/06/18 1505    Subjective  Patient reports her back is much better then it was last visit. She is not having pain right now. She has been working on her stretches and exercises.     Limitations  Lifting    How long can you sit comfortably?  Some times has pain.    Lying also causes pain    How long can you stand comfortably?  60 min    How long can you walk comfortably?  60 min    Diagnostic tests  none    Patient Stated Goals  She wants to improve  strength.    Decrease pain.    Currently in Pain?  No/denies                       [facility] Adult PT Treatment/Exercise - 05/07/18 0001      Therapeutic Activites    Therapeutic Activities  Lifting    Lifting  12 lbs x10 frm table 2x5 from the groun; 17 lbs x10 from the table 2x5 form the ground, 22 lbs x10 from the table x5 from the ground. Min cuing       Lumbar Exercises: Stretches   Single Knee to Chest Stretch  2 reps;30 seconds    Lower Trunk Rotation  30 seconds    Piriformis Stretch Limitations  3x20 sec hold bilateral       Lumbar Exercises: Aerobic   Stationary Bike  L3x5'      Moist Heat Therapy   Number Minutes Moist Heat  10 Minutes    Moist Heat Location  Lumbar Spine             PT Education - 05/06/18 1507    Education Details  reviewed HEP and lifting  technique     Person(s) Educated  Patient    Methods  Explanation    Comprehension  Verbalized understanding;Returned demonstration;Verbal cues required;Tactile cues required          PT Long Term Goals - 05/07/18 1239      PT LONG TERM GOAL #1   Title  She will be independnet with all HEp issued    Baseline  Indpendent with HEP     Time  4    Period  Weeks    Status  Achieved      PT LONG TERM GOAL #2   Title  she will report pain decr 75% or more    Baseline  had improved until recent setback     Time  4    Period  Weeks    Status  Achieved      PT LONG TERM GOAL #3   Title  SHe will be able to lift 50  pounds from overhead to be able to return to work safely    Baseline  15 lbs comfrtably     Time  4    Period  Weeks    Status  On-going      PT LONG TERM GOAL #4   Title  she will be able to lift from floor  50  pounds  to return to work safely    Baseline  22 lbs at this time    Time  4    Period  Weeks    Status  On-going            Plan - 05/07/18 0939    Clinical Impression Statement  Therapy was able to advance patients lifting to 22 lbs. She was able to  lift from the table and the ground. She could feel it in her muscles nut no increase in actual lower back pain. She will go back to the MD on Thursday. The patient is activley trying to find a job that dosent involve as much lifting. 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 If she is fgoing back to a job that does not demand as much lifting she is likley close to discharge., She was able to get her pain back under control qucikly from last visit. Therapy will progress per MD reccomendation     Clinical Presentation  Stable    Clinical Decision Making  Low    PT Frequency  2x / week    PT Duration  4 weeks    PT Treatment/Interventions  Passive range of motion;Patient/family education;Therapeutic exercise;Manual techniques;Cryotherapy;Moist Heat;Iontophoresis 4mg /ml Dexamethasone;Ultrasound;Therapeutic activities       Patient will benefit from skilled therapeutic intervention in order to improve the following deficits and impairments:     Visit Diagnosis: Abnormal posture  Muscle spasm of back  Right-sided low back pain without sciatica, unspecified chronicity     Problem List Patient Active Problem List   Diagnosis Date Noted  . S/P TAH (total abdominal hysterectomy) 03/10/2015  . Vasovagal syncope 07/07/2014  . Essential hypertension 07/07/2014  . Hyperlipidemia 07/07/2014    Carney Living 05/07/2018, 12:56 PM  [facility] [address] [place], [name], [address] Phone: [phone]   Fax:  [phone]  Name: Heather Rios MRN: [id number] Date of Birth: 05-11-1965

## 2018-05-12 ENCOUNTER — Ambulatory Visit: Payer: 59 | Admitting: Podiatry

## 2018-05-12 ENCOUNTER — Ambulatory Visit: Payer: PRIVATE HEALTH INSURANCE

## 2018-05-12 DIAGNOSIS — M545 Low back pain, unspecified: Secondary | ICD-10-CM

## 2018-05-12 DIAGNOSIS — M62830 Muscle spasm of back: Secondary | ICD-10-CM

## 2018-05-12 DIAGNOSIS — R293 Abnormal posture: Secondary | ICD-10-CM

## 2018-05-12 MED FILL — AMLODIPINE BESYLATE 5 MG TA: 5 | 90 days supply | Qty: 90 | Fill #1

## 2018-05-12 MED FILL — PANTOPRAZOLE SOD DR 40 MG T: 40 | 90 days supply | Qty: 180 | Fill #1 | Status: TO

## 2018-05-12 MED FILL — FLUCONAZOLE 150 MG TABS: 150 | 2 days supply | Qty: 2 | Fill #0

## 2018-05-12 NOTE — Therapy (Signed)
[place], [name], [address] Phone: [phone]   Fax:  [phone]  Physical Therapy Treatment  Patient Details  Name: Heather Rios MRN: [id number] Date of Birth: 1965/09/28 Referring Provider (PT): Odis Luster, MD   Encounter Date: 05/12/2018  PT End of Session - 05/12/18 0700    Visit Number  11    Number of Visits  16    Date for PT Re-Evaluation  05/28/18    Authorization Type  [place] [facility], 8 visits authorized    PT Start Time  0700    PT Stop Time  0742    PT Time Calculation (min)  42 min    Activity Tolerance  Patient tolerated treatment well    Behavior During Therapy  [facility] for tasks assessed/performed       Past Medical History:  Diagnosis Date  . Allergic rhinitis   . Anemia   . Anginal pain ([place])    Chest Pains 8/16  . Eczema   . Environmental allergies   . GERD (gastroesophageal reflux disease)   . Heart murmur   . High cholesterol   . Hypertension   . Hypoglycemia   . Hypoglycemia   . Plantar fasciitis   . Sinusitis, chronic     Past Surgical History:  Procedure Laterality Date  . ABDOMINAL HYSTERECTOMY N/A 03/10/2015   Procedure: HYSTERECTOMY ABDOMINAL;  Surgeon: Thurnell Lose, MD;  Location: [facility];  Service: Gynecology;  Laterality: N/A;  . BILATERAL SALPINGECTOMY Bilateral 03/10/2015   pt denies  . CHOLECYSTECTOMY    . FOOT SURGERY      There were no vitals filed for this visit.  Subjective Assessment - 05/12/18 0707    Subjective  No pain . Saw WC MD on 20th and not to full duty until 06/05/18.  She is doiing different job since 12/19.   Boxes lifted at work.     Currently in Pain?  No/denies                       [facility] Adult PT Treatment/Exercise - 05/12/18 0001      Therapeutic Activites    Lifting  Reviewed mechanics mostly for keeping box close and sliding if able no lifting.  Then did repetitions    4 sets of 10 reps with 8 pounds        Lumbar Exercises: Stretches   Single Knee to Chest Stretch  Right;Left;30 seconds;2 reps    Lower Trunk Rotation  30 seconds    Lower Trunk Rotation Limitations  RT/LT    Piriformis Stretch  Right;Left;2 reps;30 seconds      Lumbar Exercises: Aerobic   UBE (Upper Arm Bike)  L1 3 min    Nustep  L4 5 min      Lumbar Exercises: Standing   Row  Both;15 reps    Theraband Level (Row)  Level 3 (Green)    Shoulder Extension  Both;15 reps    Theraband Level (Shoulder Extension)  Level 3 (Green)    Other Standing Lumbar Exercises  cross body x 10 RT and LT              PT Education - 05/12/18 0748    Education Details  Keeping load close and to slide rather than lift if able  to decr load to back    Person(s) Educated  Patient    Methods  Explanation;Demonstration;Tactile cues;Verbal cues    Comprehension  Verbalized understanding;Returned demonstration          PT Long Term Goals - 05/07/18 1239      PT LONG TERM GOAL #1   Title  She will be independnet with all HEp issued    Baseline  Indpendent with HEP     Time  4    Period  Weeks    Status  Achieved      PT LONG TERM GOAL #2   Title  she will report pain decr 75% or more    Baseline  had improved until recent setback     Time  4    Period  Weeks    Status  Achieved      PT LONG TERM GOAL #3   Title  SHe will be able to lift 50  pounds from overhead to be able to return to work safely    Baseline  15 lbs comfrtably     Time  4    Period  Weeks    Status  On-going      PT LONG TERM GOAL #4   Title  she will be able to lift from floor  50  pounds  to return to work safely    Baseline  22 lbs at this time    Time  4    Period  Weeks    Status  On-going            Plan - 05/12/18 0707    Clinical Impression Statement  Her body mechanics are very good but needed some using to keep load close. I have asked her to contact supervisor to getr some numbers on fequency of lifting , heaviest weight ,  number of pallets loaded  during work day so we can more mimic demands of work.     PT Treatment/Interventions  Passive range of motion;Patient/family education;Therapeutic exercise;Manual techniques;Cryotherapy;Moist Heat;Iontophoresis 4mg /ml Dexamethasone;Ultrasound;Therapeutic activities    PT Next Visit Plan  continue lifting in reps and load and height.     PT Home Exercise Plan  childs pose with side bend LT . hamstring stretch,   PPT     Consulted and Agree with Plan of Care  Patient       Patient will benefit from skilled therapeutic intervention in order to improve the following deficits and impairments:  Pain, Decreased activity tolerance, Decreased strength, Increased muscle spasms, Postural dysfunction  Visit Diagnosis: Abnormal posture  Muscle spasm of back  Right-sided low back pain without sciatica, unspecified chronicity     Problem List Patient Active Problem List   Diagnosis Date Noted  . S/P TAH (total abdominal hysterectomy) 03/10/2015  . Vasovagal syncope 07/07/2014  . Essential hypertension 07/07/2014  . Hyperlipidemia 07/07/2014    Darrel Hoover  PT 05/12/2018, 7:50 AM  [facility] [address] [place], [name], [address] Phone: [phone]   Fax:  [phone]  Name: Heather Rios MRN: [id number] Date of Birth: 05-07-1965

## 2018-05-13 MED FILL — AZELASTINE HCL 137 MCG SPRY: 0.1 | 25 days supply | Qty: 30 | Fill #0

## 2018-05-15 ENCOUNTER — Ambulatory Visit: Payer: PRIVATE HEALTH INSURANCE

## 2018-05-15 DIAGNOSIS — M62830 Muscle spasm of back: Secondary | ICD-10-CM

## 2018-05-15 DIAGNOSIS — R293 Abnormal posture: Secondary | ICD-10-CM | POA: Diagnosis not present

## 2018-05-15 DIAGNOSIS — M545 Low back pain, unspecified: Secondary | ICD-10-CM

## 2018-05-15 NOTE — Therapy (Signed)
[place], [name], [address] Phone: [phone]   Fax:  [phone]  Physical Therapy Treatment  Patient Details  Name: Heather Rios MRN: [id number] Date of Birth: [birth year] Referring Provider (PT): Odis Luster, MD   Encounter Date: 05/15/2018  PT End of Session - 05/15/18 0714    Visit Number  12    Number of Visits  16    Date for PT Re-Evaluation  05/28/18    Authorization Type  [place] [facility], 8 visits authorized    Authorization - Visit Number  2    Authorization - Number of Visits  8    PT Start Time  [phone]   pt late   PT Stop Time  0745    PT Time Calculation (min)  40 min       Past Medical History:  Diagnosis Date  . Allergic rhinitis   . Anemia   . Anginal pain ([place])    Chest Pains 8/16  . Eczema   . Environmental allergies   . GERD (gastroesophageal reflux disease)   . Heart murmur   . High cholesterol   . Hypertension   . Hypoglycemia   . Hypoglycemia   . Plantar fasciitis   . Sinusitis, chronic     Past Surgical History:  Procedure Laterality Date  . ABDOMINAL HYSTERECTOMY N/A 03/10/2015   Procedure: HYSTERECTOMY ABDOMINAL;  Surgeon: Thurnell Lose, MD;  Location: [facility];  Service: Gynecology;  Laterality: N/A;  . BILATERAL SALPINGECTOMY Bilateral 03/10/2015   pt denies  . CHOLECYSTECTOMY    . FOOT SURGERY      There were no vitals filed for this visit.  Subjective Assessment - 05/15/18 0711    Subjective  No pain . Water is 60 pounds per case and fluids is 40 pounds per case and she may need 15-30 cases on a specific order plus other items. She does 3 pallets per hour.     Currently in Pain?  No/denies                       [facility] Adult PT Treatment/Exercise - 05/15/18 0001      Therapeutic Activites    Lifting  8 pounds waist to 5 inches off floor x 10 , 8 pounds from floor to overhead x10 then 1 min rest and 18 pounds  for 10 reps waist to  floor and 10# x 10 from waist to overhead .  Then 18 pounds x 10 waist to  knee height  then 10 pounds x 10  waist to overhead.  then 20 pounds x 5  waist to knee height   pace was very slow and began to have some back pain central so stopped. This was done over  20 min with 2 short breaks.        Lumbar Exercises: Aerobic   UBE (Upper Arm Bike)  L1 3 min    Recumbent Bike  L3 4 min                  PT Long Term Goals - 05/07/18 1239      PT LONG TERM GOAL #1   Title  She will be independnet with all HEp issued    Baseline  Indpendent with HEP     Time  4    Period  Weeks    Status  Achieved      PT LONG TERM GOAL #2  Title  she will report pain decr 75% or more    Baseline  had improved until recent setback     Time  4    Period  Weeks    Status  Achieved      PT LONG TERM GOAL #3   Title  SHe will be able to lift 50  pounds from overhead to be able to return to work safely    Baseline  15 lbs comfrtably     Time  4    Period  Weeks    Status  On-going      PT LONG TERM GOAL #4   Title  she will be able to lift from floor  50  pounds  to return to work safely    Baseline  22 lbs at this time    Time  4    Period  Weeks    Status  On-going            Plan - 05/15/18 0740    Clinical Impression Statement  She was only able to tolerate max 18 pounds with multiple lifts and was not able to tolerate more than 10 pounds overhead. She was cautioned to stop with any back pain and short 20-30 sec breaks. We will try to progress lifting reps and weight as tolerated with no pain.    She did 95 lifts which should equal at least 1 order.     PT Treatment/Interventions  Passive range of motion;Patient/family education;Therapeutic exercise;Manual techniques;Cryotherapy;Moist Heat;Iontophoresis 4mg /ml Dexamethasone;Ultrasound;Therapeutic activities    PT Next Visit Plan  continue lifting in reps and load and height.     PT Home Exercise Plan  childs pose with side bend  LT . hamstring stretch,   PPT     Consulted and Agree with Plan of Care  Patient       Patient will benefit from skilled therapeutic intervention in order to improve the following deficits and impairments:  Pain, Decreased activity tolerance, Decreased strength, Increased muscle spasms, Postural dysfunction  Visit Diagnosis: Abnormal posture  Muscle spasm of back  Right-sided low back pain without sciatica, unspecified chronicity     Problem List Patient Active Problem List   Diagnosis Date Noted  . S/P TAH (total abdominal hysterectomy) 03/10/2015  . Vasovagal syncope 07/07/2014  . Essential hypertension 07/07/2014  . Hyperlipidemia 07/07/2014    Darrel Hoover  PT 05/15/2018, 7:54 AM  [facility] [address] [place], [name], [address] Phone: [phone]   Fax:  [phone]  Name: Heather Rios MRN: [id number] Date of Birth: November 15, 1965

## 2018-05-19 ENCOUNTER — Ambulatory Visit: Payer: 59 | Admitting: Podiatry

## 2018-05-19 ENCOUNTER — Ambulatory Visit: Payer: PRIVATE HEALTH INSURANCE | Attending: Family Medicine

## 2018-05-19 ENCOUNTER — Encounter: Payer: Self-pay | Admitting: Podiatry

## 2018-05-19 ENCOUNTER — Ambulatory Visit: Payer: 59

## 2018-05-19 DIAGNOSIS — R293 Abnormal posture: Secondary | ICD-10-CM | POA: Diagnosis not present

## 2018-05-19 DIAGNOSIS — M545 Low back pain, unspecified: Secondary | ICD-10-CM

## 2018-05-19 DIAGNOSIS — M62830 Muscle spasm of back: Secondary | ICD-10-CM | POA: Diagnosis present

## 2018-05-19 DIAGNOSIS — M7752 Other enthesopathy of left foot: Secondary | ICD-10-CM

## 2018-05-19 DIAGNOSIS — M722 Plantar fascial fibromatosis: Secondary | ICD-10-CM

## 2018-05-19 MED ORDER — METHYLPREDNISOLONE 4 MG PO TBPK: ORAL_TABLET | ORAL | 0 refills | Status: DC

## 2018-05-19 MED ORDER — MELOXICAM 15 MG PO TABS: 15.0000 mg | ORAL_TABLET | Freq: Every day | ORAL | 1 refills | Status: AC

## 2018-05-19 MED FILL — METHYLPREDNISOLONE 4 MG TAB: 4 | 6 days supply | Qty: 21 | Fill #0

## 2018-05-19 MED FILL — MELOXICAM 15 MG TABLET: 15 | 60 days supply | Qty: 60 | Fill #0

## 2018-05-19 NOTE — Therapy (Signed)
[place], [name], [address] Phone: [phone]   Fax:  [phone]  Physical Therapy Treatment  Patient Details  Name: Heather Rios MRN: [id number] Date of Birth: [birth year] Referring Provider (PT): Odis Luster, MD   Encounter Date: 05/19/2018  PT End of Session - 05/19/18 0712    Visit Number  13    Number of Visits  16    Date for PT Re-Evaluation  05/28/18    Authorization Type  [place] [facility], 8 visits authorized    Authorization - Visit Number  3    PT Start Time  [phone]    PT Stop Time  0745    PT Time Calculation (min)  50 min    Activity Tolerance  Patient tolerated treatment well    Behavior During Therapy  [facility] for tasks assessed/performed       Past Medical History:  Diagnosis Date  . Allergic rhinitis   . Anemia   . Anginal pain ([place])    Chest Pains 8/16  . Eczema   . Environmental allergies   . GERD (gastroesophageal reflux disease)   . Heart murmur   . High cholesterol   . Hypertension   . Hypoglycemia   . Hypoglycemia   . Plantar fasciitis   . Sinusitis, chronic     Past Surgical History:  Procedure Laterality Date  . ABDOMINAL HYSTERECTOMY N/A 03/10/2015   Procedure: HYSTERECTOMY ABDOMINAL;  Surgeon: Thurnell Lose, MD;  Location: [facility];  Service: Gynecology;  Laterality: N/A;  . BILATERAL SALPINGECTOMY Bilateral 03/10/2015   pt denies  . CHOLECYSTECTOMY    . FOOT SURGERY      There were no vitals filed for this visit.  Subjective Assessment - 05/19/18 0748    Subjective  No pain today. was hurt lifting heavy bos from overhead and have to reach deep into shelves to move heavy boxes and lift will only go so high.                       [facility] Adult PT Treatment/Exercise - 05/19/18 0001      Therapeutic Activites    Lifting   All lifts done with handles ,  8# x 10 waist to floor ( 4 in off floor) to over ehad to knee height.  then  same x 10 with  13#  , then 15 pounds same circuit  x3  reps then stopped overhead and continued to waist /knee and floor.    She felt the 15# overhead was too much. She did 18# wiast to floor to knee and back x 10 then 20#  x10,   then  12 # overhead knee to overhead. x10  cued to slide box rather than hold box in air with raising and lowering.3x10.       Lumbar Exercises: Aerobic   UBE (Upper Arm Bike)  L1 3 min    Recumbent Bike  L3 5 min             PT Education - 05/19/18 0739    Education Details  over head lifting mechanics    Person(s) Educated  Patient    Methods  Explanation    Comprehension  Verbalized understanding;Returned demonstration          PT Long Term Goals - 05/19/18 0263      PT LONG TERM GOAL #1   Title  She will be independnet with all HEp issued  Status  Achieved      PT LONG TERM GOAL #2   Title  she will report pain decr 75% or more    Status  Achieved      PT LONG TERM GOAL #3   Title  SHe will be able to lift 50  pounds from overhead to be able to return to work safely    Status  On-going      PT LONG TERM GOAL #4   Title  she will be able to lift from floor  50  pounds  to return to work safely    Status  On-going            Plan - 05/19/18 0713    Clinical Impression Statement  She di 100 lifts in set of 10 with varying heights . Limited to 12 pound s overhead and max 20 pounds other heights  though she begans to feel some mild strain with last set with 20 poiunds. . All done in 35 min with 110 lifts total at different hweight.  She appears to be able to do the frequency of reps but not wait but also cannot be sure about being able to do 8 hours .      PT Treatment/Interventions  Passive range of motion;Patient/family education;Therapeutic exercise;Manual techniques;Cryotherapy;Moist Heat;Iontophoresis 4mg /ml Dexamethasone;Ultrasound;Therapeutic activities    PT Next Visit Plan  continue lifting in reps and load and height.     PT Home Exercise  Plan  childs pose with side bend LT . hamstring stretch,   PPT     Consulted and Agree with Plan of Care  Patient       Patient will benefit from skilled therapeutic intervention in order to improve the following deficits and impairments:  Pain, Decreased activity tolerance, Decreased strength, Increased muscle spasms, Postural dysfunction  Visit Diagnosis: Abnormal posture  Muscle spasm of back  Right-sided low back pain without sciatica, unspecified chronicity     Problem List Patient Active Problem List   Diagnosis Date Noted  . S/P TAH (total abdominal hysterectomy) 03/10/2015  . Vasovagal syncope 07/07/2014  . Essential hypertension 07/07/2014  . Hyperlipidemia 07/07/2014    Darrel Hoover  PT 05/19/2018, 7:50 AM  [facility] [address] [place], [name], [address] Phone: [phone]   Fax:  [phone]  Name: Heather Rios MRN: [id number] Date of Birth: 1965-11-20

## 2018-05-21 DIAGNOSIS — J309 Allergic rhinitis, unspecified: Secondary | ICD-10-CM | POA: Diagnosis not present

## 2018-05-21 DIAGNOSIS — I10 Essential (primary) hypertension: Secondary | ICD-10-CM | POA: Diagnosis not present

## 2018-05-21 DIAGNOSIS — E7800 Pure hypercholesterolemia, unspecified: Secondary | ICD-10-CM | POA: Diagnosis not present

## 2018-05-21 DIAGNOSIS — L309 Dermatitis, unspecified: Secondary | ICD-10-CM | POA: Diagnosis not present

## 2018-05-21 DIAGNOSIS — K219 Gastro-esophageal reflux disease without esophagitis: Secondary | ICD-10-CM | POA: Diagnosis not present

## 2018-05-21 DIAGNOSIS — F419 Anxiety disorder, unspecified: Secondary | ICD-10-CM | POA: Diagnosis not present

## 2018-05-21 MED FILL — MONTELUKAST SOD 10 MG TAB: 10 | 30 days supply | Qty: 30 | Fill #0 | Status: TO

## 2018-05-21 NOTE — Progress Notes (Signed)
   HPI: 53 year old female presenting today with a chief complaint of pain to the left foot that began 6-7 months ago. She states the entire foot hurts but is worse on the lateral plantar aspect. Walking increases the pain. She has taken Prednisone in the past which helped alleviate her symptoms. Patient is here for further evaluation and treatment.   Past Medical History:  Diagnosis Date  . Allergic rhinitis   . Anemia   . Anginal pain ([place])    Chest Pains 8/16  . Eczema   . Environmental allergies   . GERD (gastroesophageal reflux disease)   . Heart murmur   . High cholesterol   . Hypertension   . Hypoglycemia   . Hypoglycemia   . Plantar fasciitis   . Sinusitis, chronic      Physical Exam: General: The patient is alert and oriented x3 in no acute distress.  Dermatology: Skin is warm, dry and supple bilateral lower extremities. Negative for open lesions or macerations.  Vascular: Palpable pedal pulses bilaterally. No edema or erythema noted. Capillary refill within normal limits.  Neurological: Epicritic and protective threshold grossly intact bilaterally.   Musculoskeletal Exam: Pain with palpation noted to the 5th MPJ of the left foot. Range of motion within normal limits to all pedal and ankle joints bilateral. Muscle strength 5/5 in all groups bilateral.   Radiographic Exam:  Normal osseous mineralization. Joint spaces preserved. No fracture/dislocation/boney destruction.    Assessment: 1. 5th MPJ capsulitis left    Plan of Care:  1. Patient evaluated. X-Rays reviewed.  2. Injection of 0.5 mLs Celestone Soluspan injected into the 5th MPJ of the left foot.  3. Appointment with Liliane Channel, Pedorthist, for custom molded orthotics.  4. Prescription for Medrol Dose Pak provided to patient. 5. Prescription for Meloxicam provided to patient. 6. Return to clinic as needed.      Edrick Kins, DPM [facility]  Dr. Edrick Kins, DPM    [address], [place] [address]                Office [phone]  Fax [phone]

## 2018-05-22 ENCOUNTER — Ambulatory Visit: Payer: PRIVATE HEALTH INSURANCE

## 2018-05-22 DIAGNOSIS — M545 Low back pain, unspecified: Secondary | ICD-10-CM

## 2018-05-22 DIAGNOSIS — M62830 Muscle spasm of back: Secondary | ICD-10-CM

## 2018-05-22 DIAGNOSIS — R293 Abnormal posture: Secondary | ICD-10-CM | POA: Diagnosis not present

## 2018-05-22 NOTE — Therapy (Signed)
[place], [name], [address] Phone: [phone]   Fax:  [phone]  Physical Therapy Treatment  Patient Details  Name: Heather Rios MRN: [id number] Date of Birth: [birth year] Referring Provider (PT): Odis Luster, MD   Encounter Date: 05/22/2018  PT End of Session - 05/22/18 0703    Visit Number  14    Number of Visits  18    Date for PT Re-Evaluation  06/06/18    Authorization Type  [place] [facility], 8 visits authorized    Authorization - Visit Number  4    Authorization - Number of Visits  8    PT Start Time  0703    PT Stop Time  0749    PT Time Calculation (min)  46 min    Activity Tolerance  Patient tolerated treatment well    Behavior During Therapy  [facility] for tasks assessed/performed       Past Medical History:  Diagnosis Date  . Allergic rhinitis   . Anemia   . Anginal pain ([place])    Chest Pains 8/16  . Eczema   . Environmental allergies   . GERD (gastroesophageal reflux disease)   . Heart murmur   . High cholesterol   . Hypertension   . Hypoglycemia   . Hypoglycemia   . Plantar fasciitis   . Sinusitis, chronic     Past Surgical History:  Procedure Laterality Date  . ABDOMINAL HYSTERECTOMY N/A 03/10/2015   Procedure: HYSTERECTOMY ABDOMINAL;  Surgeon: Thurnell Lose, MD;  Location: [facility];  Service: Gynecology;  Laterality: N/A;  . BILATERAL SALPINGECTOMY Bilateral 03/10/2015   pt denies  . CHOLECYSTECTOMY    . FOOT SURGERY      There were no vitals filed for this visit.  Subjective Assessment - 05/22/18 0704    Subjective  No pain doing well.     Currently in Pain?  No/denies                       [facility] Adult PT Treatment/Exercise - 05/22/18 0001      Therapeutic Activites    Lifting   All lifts done with handles ,  8# x 10 waist to floor ( 4 in off floor) to over head to knee height.  then  same x 10 with 13#  , then 15 pounds same circuit  x10 reps   with change in hand position on box with RT hand /palm under corner to push box up.   then  x 4 18 pounds overhead and lower  then continued to waist /knee and floor. x 6 more.   She felt the 18# overhead was too much. She did 18# wiast to floor to knee and back x 10,  then 23#  2 x10,   then  12 # overhead knee to overhead. x10  cued to slide box rather than hold box in air with raising and lowering and again use corner to push box up  3x10.       Lumbar Exercises: Aerobic   UBE (Upper Arm Bike)  L2 3 min    Recumbent Bike  L3 5 min                  PT Long Term Goals - 05/22/18 0744      PT LONG TERM GOAL #2   Title  she will report pain decr 75% or more    Status  Achieved      PT LONG TERM GOAL #3   Title  SHe will be able to lift 50  pounds from overhead to be able to return to work safely    Baseline  15 lbs comfrtably today    Status  On-going      PT LONG TERM GOAL #4   Title  she will be able to lift from floor  50  pounds  to return to work safely    Baseline  23 today    Status  On-going            Plan - 05/22/18 0704    Clinical Impression Statement  Stilll limited with weight compared to sork needs but lifted more weight today multiple reps .   Will extend dates to when she sees Dr Maylon Peppers    PT Treatment/Interventions  Passive range of motion;Patient/family education;Therapeutic exercise;Manual techniques;Cryotherapy;Moist Heat;Iontophoresis 4mg /ml Dexamethasone;Ultrasound;Therapeutic activities    PT Next Visit Plan  continue lifting in reps and load and height.     PT Home Exercise Plan  childs pose with side bend LT . hamstring stretch,   PPT     Consulted and Agree with Plan of Care  Patient       Patient will benefit from skilled therapeutic intervention in order to improve the following deficits and impairments:  Pain, Decreased activity tolerance, Decreased strength, Increased muscle spasms, Postural dysfunction  Visit Diagnosis: Abnormal  posture  Muscle spasm of back  Right-sided low back pain without sciatica, unspecified chronicity     Problem List Patient Active Problem List   Diagnosis Date Noted  . S/P TAH (total abdominal hysterectomy) 03/10/2015  . Vasovagal syncope 07/07/2014  . Essential hypertension 07/07/2014  . Hyperlipidemia 07/07/2014    Darrel Hoover  PT 05/22/2018, 7:47 AM  [facility] [address] [place], [name], [address] Phone: [phone]   Fax:  [phone]  Name: Heather Rios MRN: [id number] Date of Birth: 12/10/1965

## 2018-05-26 ENCOUNTER — Ambulatory Visit: Payer: PRIVATE HEALTH INSURANCE | Admitting: Physical Therapy

## 2018-05-26 DIAGNOSIS — M545 Low back pain, unspecified: Secondary | ICD-10-CM

## 2018-05-26 DIAGNOSIS — R293 Abnormal posture: Secondary | ICD-10-CM | POA: Diagnosis not present

## 2018-05-26 DIAGNOSIS — M62830 Muscle spasm of back: Secondary | ICD-10-CM

## 2018-05-27 NOTE — Therapy (Signed)
[place], [name], [address] Phone: [phone]   Fax:  [phone]  Physical Therapy Treatment  Patient Details  Name: Heather Rios MRN: [id number] Date of Birth: 08/18/1965 Referring Provider (PT): Odis Luster, MD   Encounter Date: 05/26/2018  PT End of Session - 05/27/18 0906    Visit Number  15    Number of Visits  18    Date for PT Re-Evaluation  06/06/18    Authorization Type  [place] [facility], 8 visits authorized    Authorization - Visit Number  5    Authorization - Number of Visits  8    PT Start Time  1630    PT Stop Time  1710    PT Time Calculation (min)  40 min    Activity Tolerance  Patient tolerated treatment well    Behavior During Therapy  [facility] for tasks assessed/performed       Past Medical History:  Diagnosis Date  . Allergic rhinitis   . Anemia   . Anginal pain ([place])    Chest Pains 8/16  . Eczema   . Environmental allergies   . GERD (gastroesophageal reflux disease)   . Heart murmur   . High cholesterol   . Hypertension   . Hypoglycemia   . Hypoglycemia   . Plantar fasciitis   . Sinusitis, chronic     Past Surgical History:  Procedure Laterality Date  . ABDOMINAL HYSTERECTOMY N/A 03/10/2015   Procedure: HYSTERECTOMY ABDOMINAL;  Surgeon: Thurnell Lose, MD;  Location: [facility];  Service: Gynecology;  Laterality: N/A;  . BILATERAL SALPINGECTOMY Bilateral 03/10/2015   pt denies  . CHOLECYSTECTOMY    . FOOT SURGERY      There were no vitals filed for this visit.  Subjective Assessment - 05/26/18 1717    Subjective  No pain doing well unless has to lift anything overhead    Currently in Pain?  No/denies                       [facility] Adult PT Treatment/Exercise - 05/27/18 0909      Exercises   Exercises  Lumbar      Lumbar Exercises: Aerobic   UBE (Upper Arm Bike)  L2 for 5 min total intervals 30 sec fast, 30 sec easy      Lumbar Exercises:  Machines for Strengthening   Cybex Knee Extension  10 lbs X 20    Cybex Knee Flexion  25 lbs X 20    Leg Press  25 lbs X 20    Other Lumbar Machine Exercise  rows and lat pull 2X15 20 lbs    Other Lumbar Machine Exercise  chest press 15 lbs 2X15      Lumbar Exercises: Standing   Lifting Limitations  5 lb KB counter to 2nd  high shelf X 10 then overhead press 5 lb dumbell each hand X 15, then deadlift 10 lb KB X 10, then KB swings X 20, then farmers carry 10 lb one lap around gym Rt arm, then one lap with left arm      Modalities   Modalities  --   declined today                 PT Long Term Goals - 05/22/18 0744      PT LONG TERM GOAL #2   Title  she will report pain decr 75% or more  Status  Achieved      PT LONG TERM GOAL #3   Title  SHe will be able to lift 50  pounds from overhead to be able to return to work safely    Baseline  15 lbs comfrtably today    Status  On-going      PT LONG TERM GOAL #4   Title  she will be able to lift from floor  50  pounds  to return to work safely    Baseline  23 today    Status  On-going            Plan - 05/27/18 0907    Clinical Impression Statement  Session focused on functional lifting and work conditioning and general conditioning today with good tolerance. OH lifting progressed as this is her biggest complaint. She will still need PT to progress lifitng and full work duties.     Rehab Potential  Good    PT Frequency  2x / week    PT Duration  4 weeks    PT Treatment/Interventions  Passive range of motion;Patient/family education;Therapeutic exercise;Manual techniques;Cryotherapy;Moist Heat;Iontophoresis 4mg /ml Dexamethasone;Ultrasound;Therapeutic activities    PT Next Visit Plan  continue lifting in reps and load and height.     PT Home Exercise Plan  childs pose with side bend LT . hamstring stretch,   PPT     Consulted and Agree with Plan of Care  Patient       Patient will benefit from skilled therapeutic  intervention in order to improve the following deficits and impairments:  Pain, Decreased activity tolerance, Decreased strength, Increased muscle spasms, Postural dysfunction  Visit Diagnosis: Abnormal posture  Muscle spasm of back  Right-sided low back pain without sciatica, unspecified chronicity     Problem List Patient Active Problem List   Diagnosis Date Noted  . S/P TAH (total abdominal hysterectomy) 03/10/2015  . Vasovagal syncope 07/07/2014  . Essential hypertension 07/07/2014  . Hyperlipidemia 07/07/2014    Heather Rios 05/27/2018, 9:10 AM  [facility] [address] [place], [name], [address] Phone: [phone]   Fax:  [phone]  Name: Heather Rios MRN: [id number] Date of Birth: 03-25-1965

## 2018-05-28 ENCOUNTER — Other Ambulatory Visit: Payer: Self-pay

## 2018-05-28 ENCOUNTER — Ambulatory Visit: Payer: PRIVATE HEALTH INSURANCE | Admitting: Physical Therapy

## 2018-05-28 DIAGNOSIS — R293 Abnormal posture: Secondary | ICD-10-CM | POA: Diagnosis not present

## 2018-05-28 DIAGNOSIS — M545 Low back pain, unspecified: Secondary | ICD-10-CM

## 2018-05-28 DIAGNOSIS — M62830 Muscle spasm of back: Secondary | ICD-10-CM

## 2018-05-28 NOTE — Therapy (Signed)
Heather Rios, [name], [address] Phone: [phone]   Fax:  [phone]  Physical Therapy Treatment  Patient Details  Name: Heather Rios MRN: [id number] Date of Birth: Nov 18, 1965 Referring Provider (PT): Odis Luster, MD   Encounter Date: 05/28/2018  PT End of Session - 05/28/18 2127    Visit Number  16    Number of Visits  18    Date for PT Re-Evaluation  06/06/18    Authorization Type  [place] [facility], 8 visits authorized    Authorization - Visit Number  6    Authorization - Number of Visits  8    PT Start Time  1630    PT Stop Time  6962    PT Time Calculation (min)  45 min    Activity Tolerance  Patient tolerated treatment well    Behavior During Therapy  [facility] for tasks assessed/performed       Past Medical History:  Diagnosis Date  . Allergic rhinitis   . Anemia   . Anginal pain ([place])    Chest Pains 8/16  . Eczema   . Environmental allergies   . GERD (gastroesophageal reflux disease)   . Heart murmur   . High cholesterol   . Hypertension   . Hypoglycemia   . Hypoglycemia   . Plantar fasciitis   . Sinusitis, chronic     Past Surgical History:  Procedure Laterality Date  . ABDOMINAL HYSTERECTOMY N/A 03/10/2015   Procedure: HYSTERECTOMY ABDOMINAL;  Surgeon: Thurnell Lose, MD;  Location: [facility];  Service: Gynecology;  Laterality: N/A;  . BILATERAL SALPINGECTOMY Bilateral 03/10/2015   pt denies  . CHOLECYSTECTOMY    . FOOT SURGERY      There were no vitals filed for this visit.  Subjective Assessment - 05/28/18 1646    Subjective  Pt relays she is having a good day with her back pain today. Still some pain in she has to lift overhead    Currently in Pain?  No/denies                       [facility] Adult PT Treatment/Exercise - 05/28/18 0001      Exercises   Exercises  Lumbar      Lumbar Exercises: Aerobic   Elliptical  L2 5 min of intervals 30 sec fast, 30 sec  easy      Lumbar Exercises: Machines for Strengthening   Cybex Knee Extension  10 lbs X 20    Cybex Knee Flexion  25 lbs X 20    Leg Press  35 lbs X20    Other Lumbar Machine Exercise  rows and lat pull 2X15 20 lbs    Other Lumbar Machine Exercise  chest press 15 lbs 2X15      Lumbar Exercises: Standing   Lifting Limitations  10 lb KB counter to 2nd  high shelf 2X5 then overhead press 5 lb dumbell each hand X 15, then deadlift 15 lb KB X 15, then KB swings X 20, then farmers carry 15 lb one lap around gym Rt arm, then one lap with left arm                  PT Long Term Goals - 05/22/18 0744      PT LONG TERM GOAL #2   Title  she will report pain decr 75% or more    Status  Achieved  PT LONG TERM GOAL #3   Title  SHe will be able to lift 50  pounds from overhead to be able to return to work safely    Baseline  15 lbs comfrtably today    Status  On-going      PT LONG TERM GOAL #4   Title  she will be able to lift from floor  50  pounds  to return to work safely    Baseline  23 today    Status  On-going            Plan - 05/28/18 2128    Clinical Impression Statement  Pt able to progress her weight and or reps for functional lifting today with good tolerance and she shows good effort with PT. Although she is progressing with lifting tolerance and abilities she still is not lifiting to her full job requrirements and will continue to benefit from PT.     Rehab Potential  Good    PT Frequency  2x / week    PT Duration  4 weeks    PT Treatment/Interventions  Passive range of motion;Patient/family education;Therapeutic exercise;Manual techniques;Cryotherapy;Moist Heat;Iontophoresis 4mg /ml Dexamethasone;Ultrasound;Therapeutic activities    PT Next Visit Plan  continue lifting in reps and load and height.     PT Home Exercise Plan  childs pose with side bend LT . hamstring stretch,   PPT     Consulted and Agree with Plan of Care  Patient       Patient will  benefit from skilled therapeutic intervention in order to improve the following deficits and impairments:  Pain, Decreased activity tolerance, Decreased strength, Increased muscle spasms, Postural dysfunction  Visit Diagnosis: Abnormal posture  Muscle spasm of back  Right-sided low back pain without sciatica, unspecified chronicity     Problem List Patient Active Problem List   Diagnosis Date Noted  . S/P TAH (total abdominal hysterectomy) 03/10/2015  . Vasovagal syncope 07/07/2014  . Essential hypertension 07/07/2014  . Hyperlipidemia 07/07/2014    Heather Rios 05/28/2018, 9:30 PM  [facility] [address] [place], [name], [address] Phone: [phone]   Fax:  [phone]  Name: Heather Rios MRN: [id number] Date of Birth: 07/15/1965

## 2018-05-30 MED FILL — VALSARTAN 320 MG TAB: 320 | 90 days supply | Qty: 90 | Fill #0

## 2018-06-04 ENCOUNTER — Ambulatory Visit: Payer: PRIVATE HEALTH INSURANCE | Admitting: Physical Therapy

## 2018-06-04 ENCOUNTER — Other Ambulatory Visit: Payer: Self-pay

## 2018-06-04 DIAGNOSIS — M545 Low back pain, unspecified: Secondary | ICD-10-CM

## 2018-06-04 DIAGNOSIS — R293 Abnormal posture: Secondary | ICD-10-CM | POA: Diagnosis not present

## 2018-06-04 DIAGNOSIS — M62830 Muscle spasm of back: Secondary | ICD-10-CM

## 2018-06-04 MED FILL — IRBESARTAN 300 MG TABLET: 300 | 90 days supply | Qty: 90 | Fill #0 | Status: TO

## 2018-06-04 NOTE — Therapy (Signed)
[place], [name], [address] Phone: [phone]   Fax:  [phone]  Physical Therapy Treatment  Patient Details  Name: Heather Rios MRN: [id number] Date of Birth: 19-Dec-1965 Referring Provider (PT): Odis Luster, MD   Encounter Date: 06/04/2018  PT End of Session - 06/04/18 1718    Visit Number  17    Number of Visits  18    Date for PT Re-Evaluation  06/06/18    Authorization Type  [place] [facility], 8 visits authorized    Authorization - Visit Number  7    Authorization - Number of Visits  8    PT Start Time  1630    PT Stop Time  6160    PT Time Calculation (min)  45 min    Activity Tolerance  Patient tolerated treatment well    Behavior During Therapy  [facility] for tasks assessed/performed       Past Medical History:  Diagnosis Date  . Allergic rhinitis   . Anemia   . Anginal pain ([place])    Chest Pains 8/16  . Eczema   . Environmental allergies   . GERD (gastroesophageal reflux disease)   . Heart murmur   . High cholesterol   . Hypertension   . Hypoglycemia   . Hypoglycemia   . Plantar fasciitis   . Sinusitis, chronic     Past Surgical History:  Procedure Laterality Date  . ABDOMINAL HYSTERECTOMY N/A 03/10/2015   Procedure: HYSTERECTOMY ABDOMINAL;  Surgeon: Thurnell Lose, MD;  Location: [facility];  Service: Gynecology;  Laterality: N/A;  . BILATERAL SALPINGECTOMY Bilateral 03/10/2015   pt denies  . CHOLECYSTECTOMY    . FOOT SURGERY      There were no vitals filed for this visit.  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     Currently in Pain?  No/denies                       [name] Adult PT Treatment/Exercise - 06/04/18 0001      Lumbar Exercises: Aerobic   Tread Mill  3.0  mph (8 min total) for 3 min then incline increased 1% each min for last 5 min      Lumbar Exercises: Machines for Strengthening   Cybex Knee Extension  10 lbs 2X15    Cybex Knee Flexion  35 lbs 2X15    Leg Press  45 lbs 2X15    Other Lumbar Machine Exercise  rows and lat pull 2X15 25 lbs    Other Lumbar Machine Exercise  chest press 15 lbs 2X15      Lumbar Exercises: Standing   Lifting Limitations  overhead press 7lb dumbell each hand 2X10, then deadlift 25 lb KB 2X10, then KB swings 15 lb X 20, then farmers carry 15 lb one lap around gym Rt arm, then one lap with left arm, then power clean and press from floor 10 lb KB X 10 each side                  PT Long Term Goals - 05/22/18 0744      PT LONG TERM GOAL #2   Title  she will report pain decr 75% or more  Status  Achieved      PT LONG TERM GOAL #3   Title  SHe will be able to lift 50  pounds from overhead to be able to return to work safely    Baseline  15 lbs comfrtably today    Status  On-going      PT LONG TERM GOAL #4   Title  she will be able to lift from floor  50  pounds  to return to work safely    Baseline  23 today    Status  On-going            Plan - 06/04/18 1718    Clinical Impression Statement  Pt has made excellent progress with funcitonal lifting ability and she shows good body mechanics without pain or complaints. She was again able to progress her strength program today. PT anticipates discharge next visit    Rehab Potential  Good    PT Frequency  2x / week    PT Duration  4 weeks    PT Next Visit Plan  anticipate discharge    Consulted and Agree with Plan of Care  Patient       Patient will benefit from skilled therapeutic intervention in order to improve the following deficits and impairments:  Pain, Decreased activity tolerance, Decreased strength, Increased muscle spasms, Postural dysfunction  Visit Diagnosis: Abnormal posture  Muscle spasm of back  Right-sided low back pain  without sciatica, unspecified chronicity     Problem List Patient Active Problem List   Diagnosis Date Noted  . S/P TAH (total abdominal hysterectomy) 03/10/2015  . Vasovagal syncope 07/07/2014  . Essential hypertension 07/07/2014  . Hyperlipidemia 07/07/2014    Heather Rios 06/04/2018, 5:20 PM  [facility] [address] [place], [name], [address] Phone: [phone]   Fax:  [phone]  Name: Heather Rios MRN: [id number] Date of Birth: 1965-03-28

## 2018-06-05 ENCOUNTER — Ambulatory Visit: Payer: PRIVATE HEALTH INSURANCE | Admitting: Physical Therapy

## 2018-06-05 DIAGNOSIS — R293 Abnormal posture: Secondary | ICD-10-CM

## 2018-06-05 DIAGNOSIS — M545 Low back pain, unspecified: Secondary | ICD-10-CM

## 2018-06-05 DIAGNOSIS — M62830 Muscle spasm of back: Secondary | ICD-10-CM

## 2018-06-06 ENCOUNTER — Encounter: Payer: Self-pay | Admitting: Physical Therapy

## 2018-06-06 DIAGNOSIS — Z01411 Encounter for gynecological examination (general) (routine) with abnormal findings: Secondary | ICD-10-CM | POA: Diagnosis not present

## 2018-06-06 DIAGNOSIS — R350 Frequency of micturition: Secondary | ICD-10-CM | POA: Diagnosis not present

## 2018-06-06 DIAGNOSIS — L292 Pruritus vulvae: Secondary | ICD-10-CM | POA: Diagnosis not present

## 2018-06-06 MED FILL — FLUCONAZOLE 150 MG TABS: 150 | 7 days supply | Qty: 1 | Fill #0

## 2018-06-06 NOTE — Therapy (Addendum)
[place], [name], [address] Phone: [phone]   Fax:  [phone]  Physical Therapy Treatment/Discharge  Patient Details  Name: Heather Rios MRN: [id number] Date of Birth: 06-14-1965 Referring Provider (PT): Odis Luster, MD   Encounter Date: 06/05/2018  PT End of Session - 06/06/18 0921    Visit Number  18    Number of Visits  18    Date for PT Re-Evaluation  06/06/18    Authorization Type  [place] [facility], 8 visits authorized    Authorization - Visit Number  8    PT Start Time  7078    PT Stop Time  6754   shortened visit 2nd to discharge and patient being comfortable with exercises.    PT Time Calculation (min)  32 min    Activity Tolerance  Patient tolerated treatment well    Behavior During Therapy  [facility] for tasks assessed/performed       Past Medical History:  Diagnosis Date  . Allergic rhinitis   . Anemia   . Anginal pain ([place])    Chest Pains 8/16  . Eczema   . Environmental allergies   . GERD (gastroesophageal reflux disease)   . Heart murmur   . High cholesterol   . Hypertension   . Hypoglycemia   . Hypoglycemia   . Plantar fasciitis   . Sinusitis, chronic     Past Surgical History:  Procedure Laterality Date  . ABDOMINAL HYSTERECTOMY N/A 03/10/2015   Procedure: HYSTERECTOMY ABDOMINAL;  Surgeon: Thurnell Lose, MD;  Location: [facility];  Service: Gynecology;  Laterality: N/A;  . BILATERAL SALPINGECTOMY Bilateral 03/10/2015   pt denies  . CHOLECYSTECTOMY    . FOOT SURGERY      There were no vitals filed for this visit.  Subjective Assessment - 06/06/18 0920    Subjective  Patient has no complaints. she wasnt sore after her last visit she is having no pain.     How long can you sit comfortably?  Some times has pain.    Lying also causes pain    How long can you stand comfortably?  60 min    Diagnostic tests  none    Patient Stated Goals  She wants to improve strength.     Decrease pain.    Currently in Pain?  No/denies                       [facility] Adult PT Treatment/Exercise - 06/06/18 0001      Self-Care   Other Self-Care Comments   reviewed lifting technique      Lumbar Exercises: Stretches   Active Hamstring Stretch Limitations  with step 3x20 sec hold    Piriformis Stretch  Right;Left;2 reps;30 seconds    Piriformis Stretch Limitations  3x20 sec hold bilateral       Lumbar Exercises: Aerobic   Tread Mill  3.0 mph (8 min total) for 3 min then incline increased 1% each min for last 5 min      Lumbar Exercises: Standing   Other Standing Lumbar Exercises  standing reach with a weight 2x10       Lumbar Exercises: Supine   AB Set Limitations  reviewed x5 with cuing     Clam  10 reps    Clam Limitations  2x10 green     Bent Knee Raise Limitations  2x10     Bridge Limitations  2x10    Other  Supine Lumbar Exercises  marches with core, X 15 bilat             PT Education - 06/06/18 0920    Education Details  reviewed final HEP     Person(s) Educated  Patient    Methods  Explanation;Demonstration;Tactile cues;Verbal cues    Comprehension  Verbalized understanding;Returned demonstration;Verbal cues required;Tactile cues required          PT Long Term Goals - 06/06/18 0924      PT LONG TERM GOAL #1   Title  She will be independnet with all HEp issued    Baseline  perfroming all exercises at home    Time  4    Period  Weeks    Status  Achieved      PT LONG TERM GOAL #2   Title  she will report pain decr 75% or more    Baseline  had improved until recent setback     Time  4    Period  Weeks    Status  Achieved      PT LONG TERM GOAL #3   Title  SHe will be able to lift 50  pounds from overhead to be able to return to work safely    Baseline  has lifted 25lbs consitently. Her new job will not require 50lbs. If it does she may need a formal work assessment    Time  4    Period  Weeks    Status  Not Met      PT  LONG TERM GOAL #4   Title  she will be able to lift from floor  50  pounds  to return to work safely    Baseline  25lbs for repetition     Time  4    Period  Weeks    Status  Not Met            Plan - 06/06/18 9767    Clinical Impression Statement  Therapy reviewed stretching and strengthening program with the patient. Therapy also reviewed back safety for her new job. The patient has made excellent progress. See below for goal sepcific progress.     Stability/Clinical Decision Making  Stable/Uncomplicated    Clinical Decision Making  Low    Rehab Potential  Good    PT Frequency  2x / week    PT Duration  4 weeks    PT Treatment/Interventions  Passive range of motion;Patient/family education;Therapeutic exercise;Manual techniques;Cryotherapy;Moist Heat;Iontophoresis '4mg'$ /ml Dexamethasone;Ultrasound;Therapeutic activities    PT Next Visit Plan  anticipate discharge    PT Home Exercise Plan  childs pose with side bend LT . hamstring stretch,   PPT     Consulted and Agree with Plan of Care  Patient       Patient will benefit from skilled therapeutic intervention in order to improve the following deficits and impairments:  Pain, Decreased activity tolerance, Decreased strength, Increased muscle spasms, Postural dysfunction  Visit Diagnosis: Abnormal posture  Muscle spasm of back  Right-sided low back pain without sciatica, unspecified chronicity    PHYSICAL THERAPY DISCHARGE SUMMARY  Visits from Start of Care: 18  Current functional level related to goals / functional outcomes: Significant improvement in ability to lift    Remaining deficits: Pain    Education / Equipment: HEP   Plan: Patient agrees to discharge.  Patient goals were met. Patient is being discharged due to meeting the stated rehab goals.  ?????      Problem  List Patient Active Problem List   Diagnosis Date Noted  . S/P TAH (total abdominal hysterectomy) 03/10/2015  . Vasovagal syncope 07/07/2014   . Essential hypertension 07/07/2014  . Hyperlipidemia 07/07/2014    Carney Living 06/06/2018, 9:28 AM  [facility] [address] [place], [name], [address] Phone: [phone]   Fax:  [phone]  Name: Heather Rios MRN: [id number] Date of Birth: [birth year]

## 2018-06-09 ENCOUNTER — Other Ambulatory Visit: Payer: 59 | Admitting: Orthotics

## 2018-06-25 MED FILL — MONTELUKAST SOD 10 MG TAB: 10 | 30 days supply | Qty: 30 | Fill #0

## 2018-06-27 MED FILL — PRAVASTATIN NA 40 MG TAB: 40 | 90 days supply | Qty: 90 | Fill #0

## 2018-07-21 ENCOUNTER — Encounter

## 2018-07-21 ENCOUNTER — Other Ambulatory Visit: Payer: Self-pay

## 2018-07-21 ENCOUNTER — Ambulatory Visit (INDEPENDENT_AMBULATORY_CARE_PROVIDER_SITE_OTHER): Payer: 59 | Admitting: Orthotics

## 2018-07-21 DIAGNOSIS — M7752 Other enthesopathy of left foot: Secondary | ICD-10-CM

## 2018-07-21 DIAGNOSIS — M7672 Peroneal tendinitis, left leg: Secondary | ICD-10-CM

## 2018-07-21 DIAGNOSIS — M722 Plantar fascial fibromatosis: Secondary | ICD-10-CM

## 2018-07-21 NOTE — Progress Notes (Signed)
Patient came into today to be cast for [employer] Orthotics. Upon recommendation of Dr. Amalia Hailey Patient presents with 5th cap left Goals are off loading 5th mpj left Plan vendor [place]

## 2018-08-13 ENCOUNTER — Ambulatory Visit: Payer: 59 | Admitting: Orthotics

## 2018-08-13 ENCOUNTER — Other Ambulatory Visit: Payer: Self-pay

## 2018-08-13 DIAGNOSIS — M722 Plantar fascial fibromatosis: Secondary | ICD-10-CM

## 2018-08-13 DIAGNOSIS — M7672 Peroneal tendinitis, left leg: Secondary | ICD-10-CM

## 2018-08-13 DIAGNOSIS — M7752 Other enthesopathy of left foot: Secondary | ICD-10-CM

## 2018-08-13 NOTE — Progress Notes (Signed)
Patient came in today to pick up custom made foot orthotics.  The goals were accomplished and the patient reported no dissatisfaction with said orthotics.  Patient was advised of breakin period and how to report any issues. 

## 2018-08-14 ENCOUNTER — Other Ambulatory Visit: Payer: 59 | Admitting: Orthotics

## 2018-08-20 MED FILL — TRIAMCINOLONE 0.1% CREAM: 0.1 | 30 days supply | Qty: 80 | Fill #0

## 2018-08-20 MED FILL — IRBESARTAN 300 MG TABLET: 300 | 30 days supply | Qty: 30 | Fill #0

## 2018-08-20 MED FILL — AMLODIPINE BESYLATE 5 MG TA: 5 | 90 days supply | Qty: 90 | Fill #0

## 2018-08-20 MED FILL — PANTOPRAZOLE SOD DR 40 MG T: 40 | 90 days supply | Qty: 180 | Fill #0

## 2018-08-20 MED FILL — MONTELUKAST SOD 10 MG TAB: 10 | 30 days supply | Qty: 30 | Fill #1

## 2018-08-26 ENCOUNTER — Other Ambulatory Visit: Payer: Self-pay | Admitting: Family Medicine

## 2018-08-26 DIAGNOSIS — Z1231 Encounter for screening mammogram for malignant neoplasm of breast: Secondary | ICD-10-CM

## 2018-09-04 DIAGNOSIS — R109 Unspecified abdominal pain: Secondary | ICD-10-CM | POA: Diagnosis not present

## 2018-09-04 DIAGNOSIS — K219 Gastro-esophageal reflux disease without esophagitis: Secondary | ICD-10-CM | POA: Diagnosis not present

## 2018-09-04 MED FILL — CELECOXIB 200 MG CAP: 200 | 20 days supply | Qty: 20 | Fill #0

## 2018-09-04 MED FILL — DEXILANT DR 60 MG CAPSULE: 60 | 30 days supply | Qty: 30 | Fill #0

## 2018-09-15 DIAGNOSIS — J019 Acute sinusitis, unspecified: Secondary | ICD-10-CM | POA: Diagnosis not present

## 2018-09-15 MED FILL — DOXYCYCLINE HYCLATE 100 MG: 100 | 10 days supply | Qty: 20 | Fill #0

## 2018-09-15 MED FILL — FLUCONAZOLE 150 MG TABS: 150 | 1 days supply | Qty: 1 | Fill #0

## 2018-09-25 ENCOUNTER — Other Ambulatory Visit: Payer: Self-pay

## 2018-09-25 ENCOUNTER — Ambulatory Visit
Admission: RE | Admit: 2018-09-25 | Discharge: 2018-09-25 | Disposition: A | Discharge status: Home / Self Care | Payer: 59 | Source: Ambulatory Visit | Attending: Family Medicine | Admitting: Family Medicine

## 2018-09-25 DIAGNOSIS — Z1231 Encounter for screening mammogram for malignant neoplasm of breast: Secondary | ICD-10-CM | POA: Diagnosis present

## 2018-09-29 MED FILL — DEXILANT DR 60 MG CAPSULE: 60 | 30 days supply | Qty: 30 | Fill #1

## 2018-09-29 MED FILL — PRAVASTATIN NA 40 MG TAB: 40 | 90 days supply | Qty: 90 | Fill #1

## 2018-09-29 MED FILL — IRBESARTAN 300 MG TABLET: 300 | 30 days supply | Qty: 30 | Fill #1

## 2018-10-27 MED FILL — IRBESARTAN 300 MG TABS: 300 | 30 days supply | Qty: 30 | Fill #2

## 2018-11-05 MED FILL — DEXILANT DR 60 MG CAPSULE: 60 | 30 days supply | Qty: 30 | Fill #2

## 2018-11-17 MED FILL — AMLODIPINE BESYLATE 5 MG TA: 5 | 90 days supply | Qty: 90 | Fill #1

## 2018-11-20 DIAGNOSIS — I10 Essential (primary) hypertension: Secondary | ICD-10-CM | POA: Diagnosis not present

## 2018-11-20 DIAGNOSIS — K219 Gastro-esophageal reflux disease without esophagitis: Secondary | ICD-10-CM | POA: Diagnosis not present

## 2018-11-20 DIAGNOSIS — M545 Low back pain: Secondary | ICD-10-CM | POA: Diagnosis not present

## 2018-11-20 DIAGNOSIS — G5602 Carpal tunnel syndrome, left upper limb: Secondary | ICD-10-CM | POA: Diagnosis not present

## 2018-11-20 DIAGNOSIS — J309 Allergic rhinitis, unspecified: Secondary | ICD-10-CM | POA: Diagnosis not present

## 2018-11-20 DIAGNOSIS — E7800 Pure hypercholesterolemia, unspecified: Secondary | ICD-10-CM | POA: Diagnosis not present

## 2018-11-20 DIAGNOSIS — B002 Herpesviral gingivostomatitis and pharyngotonsillitis: Secondary | ICD-10-CM | POA: Diagnosis not present

## 2018-11-20 DIAGNOSIS — Z0000 Encounter for general adult medical examination without abnormal findings: Secondary | ICD-10-CM | POA: Diagnosis not present

## 2018-11-20 DIAGNOSIS — L309 Dermatitis, unspecified: Secondary | ICD-10-CM | POA: Diagnosis not present

## 2018-11-25 MED FILL — PANTOPRAZOLE SOD DR 40 MG T: 40 | 90 days supply | Qty: 180 | Fill #0

## 2018-11-25 MED FILL — TRIAMCINOLONE 0.1% CREAM: 0.1 | 30 days supply | Qty: 80 | Fill #0

## 2018-11-25 MED FILL — AZELASTINE HCL 137 MCG SPRY: 0.1 | 50 days supply | Qty: 30 | Fill #0

## 2018-11-25 MED FILL — MONTELUKAST SOD 10 MG TAB: 10 | 90 days supply | Qty: 90 | Fill #0

## 2018-11-25 MED FILL — IRBESARTAN 300 MG TABS: 300 | 90 days supply | Qty: 90 | Fill #0

## 2018-11-25 MED FILL — FLUTICASONE PROP 50 MCG SPR: 50 | 90 days supply | Qty: 48 | Fill #0

## 2018-12-04 DIAGNOSIS — B372 Candidiasis of skin and nail: Secondary | ICD-10-CM | POA: Diagnosis not present

## 2018-12-04 DIAGNOSIS — Z23 Encounter for immunization: Secondary | ICD-10-CM | POA: Diagnosis not present

## 2018-12-04 MED FILL — FLUCONAZOLE 150 MG TABS: 150 | 12 days supply | Qty: 4 | Fill #0

## 2018-12-04 MED FILL — AZELASTINE HCL 137 MCG SPRY: 0.1 | 50 days supply | Qty: 30 | Fill #0

## 2018-12-04 MED FILL — MONTELUKAST SOD 10 MG TAB: 10 | 90 days supply | Qty: 90 | Fill #0

## 2018-12-04 MED FILL — DEXILANT DR 60 MG CAPSULE: 60 | 30 days supply | Qty: 30 | Fill #3

## 2018-12-04 MED FILL — NYSTATIN 100000 UNIT/GM POW: 100000 | 7 days supply | Qty: 15 | Fill #0

## 2019-01-02 MED FILL — DEXILANT DR 60 MG CAPSULE: 60 | 30 days supply | Qty: 30 | Fill #4

## 2019-01-02 MED FILL — PRAVASTATIN NA 40 MG TAB: 40 | 90 days supply | Qty: 90 | Fill #0

## 2019-01-06 DIAGNOSIS — M7710 Lateral epicondylitis, unspecified elbow: Secondary | ICD-10-CM | POA: Diagnosis not present

## 2019-01-06 MED FILL — MELOXICAM 15 MG TABLET: 15 | 30 days supply | Qty: 30 | Fill #0

## 2019-01-07 MED FILL — predniSONE 10 MG TABS: 10 | 8 days supply | Qty: 26 | Fill #0

## 2019-01-19 DIAGNOSIS — J019 Acute sinusitis, unspecified: Secondary | ICD-10-CM | POA: Diagnosis not present

## 2019-01-19 DIAGNOSIS — R05 Cough: Secondary | ICD-10-CM | POA: Diagnosis not present

## 2019-01-19 DIAGNOSIS — Z20828 Contact with and (suspected) exposure to other viral communicable diseases: Secondary | ICD-10-CM | POA: Diagnosis not present

## 2019-01-19 MED FILL — PROMETHAZINE W/DM SYRUP: 6.25-15 | 10 days supply | Qty: 200 | Fill #0

## 2019-01-19 MED FILL — DOXYCYCLINE HYC 100 MG CAPS: 100 | 10 days supply | Qty: 20 | Fill #0

## 2019-01-19 MED FILL — FLUCONAZOLE 150 MG TABS: 150 | 3 days supply | Qty: 2 | Fill #0

## 2019-01-26 MED FILL — FLUTICASONE PROP 50 MCG SPR: 50 | 90 days supply | Qty: 48 | Fill #0

## 2019-01-26 MED FILL — AZELASTINE HCL 0.05 % SOLN: 0.05 | 30 days supply | Qty: 6 | Fill #0

## 2019-01-28 DIAGNOSIS — U071 COVID-19: Secondary | ICD-10-CM | POA: Diagnosis not present

## 2019-01-28 MED FILL — BENZONATATE 100 MG CAPS: 100 | 10 days supply | Qty: 60 | Fill #0

## 2019-02-02 MED FILL — DEXILANT DR 60 MG CAPSULE: 60 | 30 days supply | Qty: 30 | Fill #5

## 2019-02-04 MED FILL — CELECOXIB 200 MG CAP: 200 | 30 days supply | Qty: 30 | Fill #0

## 2019-02-11 DIAGNOSIS — R05 Cough: Secondary | ICD-10-CM | POA: Diagnosis not present

## 2019-02-11 DIAGNOSIS — U071 COVID-19: Secondary | ICD-10-CM | POA: Diagnosis not present

## 2019-02-11 DIAGNOSIS — J019 Acute sinusitis, unspecified: Secondary | ICD-10-CM | POA: Diagnosis not present

## 2019-02-11 MED FILL — levoFLOXacin 500 MG TABS: 500 | 10 days supply | Qty: 10 | Fill #0

## 2019-02-16 MED FILL — AMLODIPINE BESYLATE 5 MG TA: 5 | 90 days supply | Qty: 90 | Fill #0

## 2019-02-18 ENCOUNTER — Telehealth: Payer: Self-pay | Admitting: Podiatry

## 2019-02-18 ENCOUNTER — Encounter: Payer: Self-pay | Admitting: Podiatry

## 2019-02-18 ENCOUNTER — Ambulatory Visit (INDEPENDENT_AMBULATORY_CARE_PROVIDER_SITE_OTHER): Payer: 59 | Admitting: Podiatry

## 2019-02-18 ENCOUNTER — Other Ambulatory Visit: Payer: Self-pay

## 2019-02-18 ENCOUNTER — Ambulatory Visit (INDEPENDENT_AMBULATORY_CARE_PROVIDER_SITE_OTHER): Payer: 59

## 2019-02-18 DIAGNOSIS — M7752 Other enthesopathy of left foot: Secondary | ICD-10-CM

## 2019-02-18 DIAGNOSIS — B070 Plantar wart: Secondary | ICD-10-CM | POA: Diagnosis not present

## 2019-02-18 MED ORDER — MELOXICAM 15 MG PO TABS: 15.0000 mg | ORAL_TABLET | Freq: Every day | ORAL | 1 refills | Status: DC

## 2019-02-18 MED ORDER — METHYLPREDNISOLONE 4 MG PO TBPK: ORAL_TABLET | ORAL | 0 refills | Status: DC

## 2019-02-18 NOTE — Telephone Encounter (Signed)
I informed pt of her medication orders resent to [employer].

## 2019-02-18 NOTE — Telephone Encounter (Signed)
Pt called said that she wants her prescriptions to go to [facility] long outpatient pharmacy not [place] [facility]

## 2019-02-18 NOTE — Addendum Note (Signed)
Addended by: Harriett Sine D on: 02/18/2019 11:28 AM   Modules accepted: Orders

## 2019-02-20 MED FILL — MELOXICAM 15 MG TABLET: 15 | 30 days supply | Qty: 30 | Fill #0

## 2019-02-20 MED FILL — METHYLPREDNISOLONE 4 MG TBP: 4 | 6 days supply | Qty: 21 | Fill #0

## 2019-02-22 NOTE — Progress Notes (Signed)
   HPI: 53 year old female presenting today for follow up evaluation of capsulitis of the 5th MPJ of the left foot. She reports that the pain has returned with some associated swelling. She has been using her orthotics but reports having symptoms regardless. Being on her foot increases the pain.  She also complains of a painful lesion noted to the plantar right heel that began 1-2 weeks ago. Bearing weight and walking increases the pain. She has not done anything for treatment. Patient is here for further evaluation and treatment.   Past Medical History:  Diagnosis Date  . Allergic rhinitis   . Anemia   . Anginal pain ([place])    Chest Pains 8/16  . Eczema   . Environmental allergies   . GERD (gastroesophageal reflux disease)   . Heart murmur   . High cholesterol   . Hypertension   . Hypoglycemia   . Hypoglycemia   . Plantar fasciitis   . Sinusitis, chronic      Physical Exam: General: The patient is alert and oriented x3 in no acute distress.  Dermatology: Hyperkeratotic skin lesion(s) noted to the plantar aspect of the right foot approximately 1 cm in diameter. Pinpoint bleeding noted upon debridement. Skin is warm, dry and supple bilateral lower extremities.  Vascular: Palpable pedal pulses bilaterally. No edema or erythema noted. Capillary refill within normal limits.  Neurological: Epicritic and protective threshold grossly intact bilaterally.   Musculoskeletal Exam: Pain with palpation noted to the 5th MPJ of the left foot and the noted skin lesion of the right foot. Range of motion within normal limits to all pedal and ankle joints bilateral. Muscle strength 5/5 in all groups bilateral.   Radiographic Exam:  Normal osseous mineralization. Joint spaces preserved. No fracture/dislocation/boney destruction.    Assessment: 1. 5th MPJ capsulitis left  2. Plantar wart right    Plan of Care:  1. Patient evaluated. X-Rays reviewed.  2. Injection of 0.5 mLs Celestone Soluspan  injected into the 5th MPJ of the left foot.  3. Prescription for Medrol Dose Pak provided to patient. 4. Prescription for Meloxicam provided to patient. 5. Excisional debridement of the plantar wart lesion(s) was performed using a chisel blade. Salinocaine applied and light dressing placed.  6. Recommended OTC wart remover.  7. Return to clinic as needed.    Works Barrister's clerk at [employer].      Edrick Kins, DPM [facility]  Dr. Edrick Kins, DPM    [address], [place] [address]                Office [phone]  Fax [phone]

## 2019-02-27 MED FILL — IRBESARTAN 300 MG TABS: 300 | 90 days supply | Qty: 90 | Fill #1

## 2019-03-10 MED FILL — valACYclovir HCL 1 GM TABS: 1 | 3 days supply | Qty: 12 | Fill #0

## 2019-03-10 MED FILL — DEXILANT DR 60 MG CAPSULE: 60 | 30 days supply | Qty: 30 | Fill #6

## 2019-03-12 MED FILL — PANTOPRAZOLE SOD DR 40 MG T: 40 | 90 days supply | Qty: 180 | Fill #0

## 2019-03-24 MED FILL — PANTOPRAZOLE SOD DR 40 MG T: 40 | 90 days supply | Qty: 180 | Fill #0

## 2019-03-31 ENCOUNTER — Other Ambulatory Visit: Payer: Self-pay

## 2019-03-31 ENCOUNTER — Ambulatory Visit: Payer: 59 | Admitting: Cardiology

## 2019-03-31 ENCOUNTER — Encounter: Payer: Self-pay | Admitting: Cardiology

## 2019-03-31 VITALS — BP 110/74 | HR 62 | Ht 61.0 in | Wt 149.0 lb

## 2019-03-31 DIAGNOSIS — Z90710 Acquired absence of both cervix and uterus: Secondary | ICD-10-CM

## 2019-03-31 DIAGNOSIS — K219 Gastro-esophageal reflux disease without esophagitis: Secondary | ICD-10-CM | POA: Diagnosis not present

## 2019-03-31 DIAGNOSIS — I10 Essential (primary) hypertension: Secondary | ICD-10-CM | POA: Diagnosis not present

## 2019-03-31 DIAGNOSIS — Z8616 Personal history of COVID-19: Secondary | ICD-10-CM | POA: Diagnosis not present

## 2019-03-31 DIAGNOSIS — R011 Cardiac murmur, unspecified: Secondary | ICD-10-CM | POA: Diagnosis not present

## 2019-03-31 NOTE — Progress Notes (Signed)
Cardiology Office Note:    Date:  03/31/2019   ID:  Heather Rios, DOB March 20, 1965, MRN [id number]  PCP:  Maury Dus, MD  Cardiologist:  Candee Furbish, MD  Electrophysiologist:  None   Referring MD: Maury Dus, MD     History of Present Illness:    Heather Rios is a 54 y.o. female with family history of heart disease here for evaluation at the request of Dr. Alyson Ingles.  Working at [employer].   She was last seen on 05/23/2015.  At that time was having chest discomfort seen in the emergency department sharp fleeting under left breast concerning troponin was normal EKG showed some accentuation of nonspecific T wave changes.  In February 2016 while at work in [place] she was helping a physical therapist felt hot reaching for her pocket to eat some sugar candy because she felt weak hands began to spasm next thing she knows she was sitting on the ground with the nurse checking blood pressure and blood sugar.  Orthostatics were normal.  Spasm resolved.  Losartan hydrochlorothiazide was discontinued.  Potassium was found to be low.  Echo was normal.  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  Had a mild case of COVID in November 2020. Body aches, cough, worsened sinus pressure. 48 hours. Neg flu, Pos COVID. 14 days out. Levaquin helped after. Doxy did not help earlier. So weak. Vit D, Zinc. Walking a lot. GERD, may have changed. Anxious. Acid in mouth more. Mother died several years ago, I previously saw her in the emergency department.    Past Medical History:  Diagnosis Date  . Allergic rhinitis   . Anemia   . Anginal pain ([place])    Chest Pains 8/16  . Eczema   . Environmental allergies   . Essential hypertension 07/07/2014  . GERD (gastroesophageal reflux disease)   . Heart murmur   . High cholesterol   . Hyperlipidemia 07/07/2014  . Hypertension   . Hypoglycemia   . Hypoglycemia   . Plantar fasciitis   . S/P TAH (total  abdominal hysterectomy) 03/10/2015  . Sinusitis, chronic   . Vasovagal syncope 07/07/2014    Past Surgical History:  Procedure Laterality Date  . ABDOMINAL HYSTERECTOMY N/A 03/10/2015   Procedure: HYSTERECTOMY ABDOMINAL;  Surgeon: Thurnell Lose, MD;  Location: [facility];  Service: Gynecology;  Laterality: N/A;  . BILATERAL SALPINGECTOMY Bilateral 03/10/2015   pt denies  . CHOLECYSTECTOMY    . FOOT SURGERY      Current Medications: Current Meds  Medication Sig  . amLODipine (NORVASC) 5 MG tablet Take 5 mg by mouth daily.  Marland Kitchen azelastine (ASTELIN) 0.1 % nasal spray Place 1 spray into both nostrils 2 (two) times daily as needed for allergies.   . celecoxib (CELEBREX) 200 MG capsule Take 1 capsule (200 mg total) by mouth 2 (two) times daily.  . cetirizine (ZYRTEC) 10 MG tablet Take 10 mg by mouth at bedtime as needed for allergies.   Marland Kitchen dexlansoprazole (DEXILANT) 60 MG capsule Take 60 mg by mouth daily.  . diclofenac sodium (VOLTAREN) 1 % GEL Apply 2 g topically 4 (four) times daily.  Marland Kitchen EPINEPHrine 0.3 mg/0.3 mL IJ SOAJ injection See admin instructions.  . Fexofenadine HCl (MUCINEX ALLERGY PO) Take 1,200 mg by mouth.  . fluticasone (FLONASE) 50 MCG/ACT nasal spray   . irbesartan (AVAPRO) 300 MG tablet Take 300 mg by mouth every morning.  Marland Kitchen levocetirizine (XYZAL) 5 MG tablet Take  1 tablet (5 mg total) by mouth every evening.  . meloxicam (MOBIC) 15 MG tablet Take 1 tablet (15 mg total) by mouth daily.  . montelukast (SINGULAIR) 10 MG tablet Take 10 mg by mouth at bedtime as needed (for allergies).   . mupirocin ointment (BACTROBAN) 2 % Apply 1 application topically 2 (two) times daily.  Marland Kitchen ofloxacin (OCUFLOX) 0.3 % ophthalmic solution   . Olopatadine HCl 0.2 % SOLN INSTILL 1 DROP INTO EACH EYE ONCE A DAY, AS NEEDED  . pantoprazole (PROTONIX) 40 MG tablet Take 40 mg by mouth daily.   . pravastatin (PRAVACHOL) 40 MG tablet Take 40 mg by mouth daily.    Marland Kitchen triamcinolone cream (KENALOG) 0.1 %  Apply 1 application topically 2 (two) times daily as needed (itchy skin).   Marland Kitchen zinc gluconate 50 MG tablet Take 50 mg by mouth daily.    Current Facility-Administered Medications for the 03/31/19 encounter (Office Visit) with Jerline Pain, MD  Medication  . 0.9 %  sodium chloride infusion  . betamethasone acetate-betamethasone sodium phosphate (CELESTONE) injection 3 mg     Allergies:   Contrast media [iodinated diagnostic agents], Adhesive [tape], Augmentin [amoxicillin-pot clavulanate], Eggs or egg-derived products, Hydrocodone bitartrate er, Latex, Nexium [esomeprazole], and Sudafed [pseudoephedrine hcl]   Social History   Socioeconomic History  . Marital status: Single    Spouse name: Not on file  . Number of children: 1  . Years of education: Not on file  . Highest education level: Not on file  Occupational History  . Not on file  Tobacco Use  . Smoking status: Never Smoker  . Smokeless tobacco: Never Used  Substance and Sexual Activity  . Alcohol use: No  . Drug use: No  . Sexual activity: Yes    Birth control/protection: Implant  Other Topics Concern  . Not on file  Social History Narrative  . Not on file   Social Determinants of Health   Financial Resource Strain:   . Difficulty of Paying Living Expenses: Not on file  Food Insecurity:   . Worried About Charity fundraiser in the Last Year: Not on file  . Ran Out of Food in the Last Year: Not on file  Transportation Needs:   . Lack of Transportation (Medical): Not on file  . Lack of Transportation (Non-Medical): Not on file  Physical Activity:   . Days of Exercise per Week: Not on file  . Minutes of Exercise per Session: Not on file  Stress:   . Feeling of Stress : Not on file  Social Connections:   . Frequency of Communication with Friends and Family: Not on file  . Frequency of Social Gatherings with Friends and Family: Not on file  . Attends Religious Services: Not on file  . Active Member of Clubs or  Organizations: Not on file  . Attends Archivist Meetings: Not on file  . Marital Status: Not on file     Family History: The patient's family history includes Diabetes in an other family member; Heart attack (age of onset: 34) in her mother; Heart failure in her mother; High blood pressure in an other family member; Hyperlipidemia in her father and mother; Kidney failure in her father. There is no history of Breast cancer, Colon cancer, Colon polyps, Esophageal cancer, Rectal cancer, or Stomach cancer.  ROS:   Please see the history of present illness.    No fevers chills nausea vomiting syncope all other systems reviewed and are negative.  EKGs/Labs/Other Studies Reviewed:    The following studies were reviewed today: ECHO 12/03/14: - Left ventricle: The cavity size was normal. Systolic function was   normal. The estimated ejection fraction was in the range of 55%   to 60%. Wall motion was normal; there were no regional wall   motion abnormalities. - Left atrium: The atrium was mildly dilated. - Atrial septum: No defect or patent foramen ovale was identified.   EKG:  EKG is  ordered today.  The ekg ordered today demonstrates 03/30/2018 1-62 sinus rhythm nonspecific T wave changes with T wave inversions noted in V3 through V5.  Similar to prior EKG.  Recent Labs: No results found for requested labs within last 8760 hours.  Recent Lipid Panel No results found for: CHOL, TRIG, HDL, CHOLHDL, VLDL, LDLCALC, LDLDIRECT  Physical Exam:    VS:  BP 110/74   Pulse 62   Ht 5\' 1"  (1.549 m)   Wt 149 lb (67.6 kg)   LMP 01/10/2015 (Exact Date)   SpO2 97%   BMI 28.15 kg/m     Wt Readings from Last 3 Encounters:  03/31/19 149 lb (67.6 kg)  08/22/17 140 lb 6.4 oz (63.7 kg)  04/04/17 142 lb 9.6 oz (64.7 kg)     GEN:  Well nourished, well developed in no acute distress HEENT: Normal NECK: No JVD; No carotid bruits LYMPHATICS: No lymphadenopathy CARDIAC: RRR, no murmurs,  rubs, gallops RESPIRATORY:  Clear to auscultation without rales, wheezing or rhonchi  ABDOMEN: Soft, non-tender, non-distended MUSCULOSKELETAL:  No edema; No deformity  SKIN: Warm and dry NEUROLOGIC:  Alert and oriented x 3 PSYCHIATRIC:  Normal affect   ASSESSMENT:    1. Murmur, cardiac   2. Essential hypertension   3. S/P TAH (total abdominal hysterectomy)   4. History of COVID-19   5. Gastroesophageal reflux disease without esophagitis    PLAN:    In order of problems listed above:  Atypical chest pain/GERD/heart murmur -Reviewed prior notes from 2016, sharp type discomfort likely MSK,  Reassurance.  Likely GERD related.  Given her soft heart murmur, prior Covid infection, I think it makes sense for [place] to repeat her echocardiogram since it has been 5 years.  Prior syncopal episodes -Had a prior extensive evaluation by Dr. Yancey Flemings.  Echo stress test sleep study.  Hypokalemia was previously noted and her HCTZ was discontinued.  Hydralazine was started in its place.  Also, by description, sounds vagally mediated.  No VT noted during hospital monitoring.  Essential hypertension -Avoiding HCTZ because of hyperkalemia.  Medications reviewed.  On amlodipine and irbesartan.  Excellent control.  No side effects.  Managed by Dr. Alyson Ingles.  Family history of heart disease -Continue with great prevention strategies including hypertension control, lipid management, on statin currently.  Doing well.  Continue with exercise.  She is quite active at work.   Medication Adjustments/Labs and Tests Ordered: Current medicines are reviewed at length with the patient today.  Concerns regarding medicines are outlined above.  Orders Placed This Encounter  Procedures  . EKG 12-Lead  . ECHOCARDIOGRAM COMPLETE   No orders of the defined types were placed in this encounter.   Patient Instructions  Medication Instructions:  The current medical regimen is effective;  continue present plan and  medications.  *If you need a refill on your cardiac medications before your next appointment, please call your pharmacy*  Testing/Procedures: Your physician has requested that you have an echocardiogram. Echocardiography is a painless test that uses sound waves  to create images of your heart. It provides your doctor with information about the size and shape of your heart and how well your heart's chambers and valves are working. This procedure takes approximately one hour. There are no restrictions for this procedure.  Follow-Up: Follow up as needed after the above testing has been completed.  Thank you for choosing [facility]!!        Signed, Candee Furbish, MD  03/31/2019 9:25 AM    [facility]

## 2019-03-31 NOTE — Patient Instructions (Signed)
Medication Instructions:  The current medical regimen is effective;  continue present plan and medications.  *If you need a refill on your cardiac medications before your next appointment, please call your pharmacy*  Testing/Procedures: Your physician has requested that you have an echocardiogram. Echocardiography is a painless test that uses sound waves to create images of your heart. It provides your doctor with information about the size and shape of your heart and how well your heart's chambers and valves are working. This procedure takes approximately one hour. There are no restrictions for this procedure.  Follow-Up: Follow up as needed after the above testing has been completed.  Thank you for choosing [facility]!!

## 2019-04-01 DIAGNOSIS — R109 Unspecified abdominal pain: Secondary | ICD-10-CM | POA: Diagnosis not present

## 2019-04-01 DIAGNOSIS — N3000 Acute cystitis without hematuria: Secondary | ICD-10-CM | POA: Diagnosis not present

## 2019-04-01 DIAGNOSIS — H2000 Unspecified acute and subacute iridocyclitis: Secondary | ICD-10-CM | POA: Diagnosis not present

## 2019-04-01 MED FILL — PRAVASTATIN NA 40 MG TAB: 40 | 90 days supply | Qty: 90 | Fill #1

## 2019-04-01 MED FILL — PHENAZOPYRIDINE 200 MG TAB: 200 | 3 days supply | Qty: 6 | Fill #0

## 2019-04-01 MED FILL — CEPHALEXIN 500 MG CAPSULE: 500 | 10 days supply | Qty: 20 | Fill #0

## 2019-04-09 ENCOUNTER — Ambulatory Visit (HOSPITAL_COMMUNITY): Payer: 59 | Attending: Internal Medicine

## 2019-04-09 ENCOUNTER — Other Ambulatory Visit: Payer: Self-pay

## 2019-04-09 DIAGNOSIS — I10 Essential (primary) hypertension: Secondary | ICD-10-CM | POA: Diagnosis not present

## 2019-04-09 DIAGNOSIS — R011 Cardiac murmur, unspecified: Secondary | ICD-10-CM | POA: Insufficient documentation

## 2019-04-15 ENCOUNTER — Other Ambulatory Visit: Payer: Self-pay | Admitting: Otolaryngology

## 2019-04-15 ENCOUNTER — Other Ambulatory Visit (HOSPITAL_COMMUNITY): Payer: Self-pay | Admitting: Otolaryngology

## 2019-04-15 DIAGNOSIS — J329 Chronic sinusitis, unspecified: Secondary | ICD-10-CM

## 2019-04-15 DIAGNOSIS — J310 Chronic rhinitis: Secondary | ICD-10-CM | POA: Diagnosis not present

## 2019-04-15 DIAGNOSIS — J320 Chronic maxillary sinusitis: Secondary | ICD-10-CM | POA: Diagnosis not present

## 2019-04-15 DIAGNOSIS — J342 Deviated nasal septum: Secondary | ICD-10-CM | POA: Diagnosis not present

## 2019-04-15 DIAGNOSIS — J343 Hypertrophy of nasal turbinates: Secondary | ICD-10-CM | POA: Diagnosis not present

## 2019-04-16 MED FILL — predniSONE 10 MG (21) TBPK: 10 | 6 days supply | Qty: 21 | Fill #0

## 2019-04-22 ENCOUNTER — Other Ambulatory Visit: Payer: Self-pay

## 2019-04-22 ENCOUNTER — Other Ambulatory Visit (HOSPITAL_COMMUNITY): Payer: Self-pay | Admitting: Otolaryngology

## 2019-04-22 ENCOUNTER — Encounter (HOSPITAL_COMMUNITY): Payer: Self-pay

## 2019-04-22 ENCOUNTER — Ambulatory Visit (HOSPITAL_COMMUNITY)
Admission: RE | Admit: 2019-04-22 | Discharge: 2019-04-22 | Disposition: A | Discharge status: Home / Self Care | Payer: 59 | Source: Ambulatory Visit | Attending: Otolaryngology | Admitting: Otolaryngology

## 2019-04-22 DIAGNOSIS — J329 Chronic sinusitis, unspecified: Secondary | ICD-10-CM

## 2019-04-22 MED FILL — DEXILANT DR 60 MG CAPSULE: 60 | 30 days supply | Qty: 30 | Fill #7

## 2019-04-30 ENCOUNTER — Other Ambulatory Visit: Payer: Self-pay

## 2019-04-30 ENCOUNTER — Ambulatory Visit (HOSPITAL_COMMUNITY)
Admission: RE | Admit: 2019-04-30 | Discharge: 2019-04-30 | Disposition: A | Discharge status: Home / Self Care | Payer: 59 | Source: Ambulatory Visit | Attending: Otolaryngology | Admitting: Otolaryngology

## 2019-04-30 DIAGNOSIS — J329 Chronic sinusitis, unspecified: Secondary | ICD-10-CM | POA: Diagnosis not present

## 2019-05-14 DIAGNOSIS — J343 Hypertrophy of nasal turbinates: Secondary | ICD-10-CM | POA: Diagnosis not present

## 2019-05-14 DIAGNOSIS — J342 Deviated nasal septum: Secondary | ICD-10-CM | POA: Diagnosis not present

## 2019-05-14 DIAGNOSIS — J310 Chronic rhinitis: Secondary | ICD-10-CM | POA: Diagnosis not present

## 2019-05-25 MED FILL — DEXILANT DR 60 MG CAPSULE: 60 | 30 days supply | Qty: 30 | Fill #8

## 2019-05-26 DIAGNOSIS — R634 Abnormal weight loss: Secondary | ICD-10-CM | POA: Diagnosis not present

## 2019-05-26 DIAGNOSIS — J309 Allergic rhinitis, unspecified: Secondary | ICD-10-CM | POA: Diagnosis not present

## 2019-05-26 DIAGNOSIS — B002 Herpesviral gingivostomatitis and pharyngotonsillitis: Secondary | ICD-10-CM | POA: Diagnosis not present

## 2019-05-26 DIAGNOSIS — L309 Dermatitis, unspecified: Secondary | ICD-10-CM | POA: Diagnosis not present

## 2019-05-26 DIAGNOSIS — T7808XA Anaphylactic reaction due to eggs, initial encounter: Secondary | ICD-10-CM | POA: Diagnosis not present

## 2019-05-26 DIAGNOSIS — I10 Essential (primary) hypertension: Secondary | ICD-10-CM | POA: Diagnosis not present

## 2019-05-26 DIAGNOSIS — M545 Low back pain: Secondary | ICD-10-CM | POA: Diagnosis not present

## 2019-05-26 DIAGNOSIS — E7800 Pure hypercholesterolemia, unspecified: Secondary | ICD-10-CM | POA: Diagnosis not present

## 2019-05-26 DIAGNOSIS — K219 Gastro-esophageal reflux disease without esophagitis: Secondary | ICD-10-CM | POA: Diagnosis not present

## 2019-05-26 MED FILL — IRBESARTAN 300 MG TABS: 300 | 90 days supply | Qty: 90 | Fill #0

## 2019-05-26 MED FILL — CYCLOBENZAPRINE HCL 10 MG T: 10 | 30 days supply | Qty: 30 | Fill #0

## 2019-06-01 DIAGNOSIS — Z23 Encounter for immunization: Secondary | ICD-10-CM | POA: Diagnosis not present

## 2019-06-17 MED FILL — CYCLOBENZAPRINE HCL 10 MG T: 10 | 30 days supply | Qty: 30 | Fill #0

## 2019-06-18 DIAGNOSIS — Z01419 Encounter for gynecological examination (general) (routine) without abnormal findings: Secondary | ICD-10-CM | POA: Diagnosis not present

## 2019-06-22 DIAGNOSIS — J309 Allergic rhinitis, unspecified: Secondary | ICD-10-CM | POA: Diagnosis not present

## 2019-06-22 DIAGNOSIS — J019 Acute sinusitis, unspecified: Secondary | ICD-10-CM | POA: Diagnosis not present

## 2019-06-22 DIAGNOSIS — K219 Gastro-esophageal reflux disease without esophagitis: Secondary | ICD-10-CM | POA: Diagnosis not present

## 2019-06-22 MED FILL — DOXYCYCLINE HYCLATE 100 MG: 100 | 7 days supply | Qty: 14 | Fill #0

## 2019-06-25 ENCOUNTER — Telehealth: Payer: Self-pay | Admitting: Podiatry

## 2019-06-25 NOTE — Telephone Encounter (Signed)
Pt left voicemail stating she needed me to call for an appt.  Returned call and pt is having some pain and swelling and needed an appt after 215pm. I scheduled her for 3.26 @ 315. She then asked if we sold shoes and I explained we do but they are generally for diabetics. She then asked if I could recommend a good shoe to her. I told her Rolena Infante or new balance and Liliane Channel recommends [employer]. I told pt to go to the shoe market and they have pedorthist on staff that should be able to get her a good shoe.(told her to mention we sent her and they may give a little discount.)

## 2019-07-01 MED FILL — DEXILANT DR 60 MG CAPSULE: 60 | 30 days supply | Qty: 30 | Fill #9

## 2019-07-02 MED FILL — PRAVASTATIN NA 40 MG TAB: 40 | 90 days supply | Qty: 90 | Fill #0

## 2019-07-10 MED FILL — FLUTICASONE PROP 50 MCG SPR: 50 | 30 days supply | Qty: 16 | Fill #1

## 2019-07-10 MED FILL — NAPROXEN 500 MG TABS: 500 | 30 days supply | Qty: 60 | Fill #0

## 2019-07-13 ENCOUNTER — Ambulatory Visit: Payer: 59 | Admitting: Podiatry

## 2019-07-13 ENCOUNTER — Other Ambulatory Visit: Payer: Self-pay

## 2019-07-13 DIAGNOSIS — M79672 Pain in left foot: Secondary | ICD-10-CM

## 2019-07-13 DIAGNOSIS — R600 Localized edema: Secondary | ICD-10-CM | POA: Diagnosis not present

## 2019-07-13 MED ORDER — MELOXICAM 15 MG PO TABS: 15.0000 mg | ORAL_TABLET | Freq: Every day | ORAL | 1 refills | Status: DC

## 2019-07-13 MED ORDER — METHYLPREDNISOLONE 4 MG PO TBPK: ORAL_TABLET | ORAL | 0 refills | Status: DC

## 2019-07-13 MED FILL — MELOXICAM 15 MG TABLET: 15 | 30 days supply | Qty: 30 | Fill #0

## 2019-07-13 MED FILL — METHYLPREDNISOLONE 4 MG TBP: 4 | 6 days supply | Qty: 21 | Fill #0

## 2019-07-16 NOTE — Progress Notes (Signed)
   HPI: 54 y.o. female presenting today for follow up evaluation of left foot pain. She reports an increase in swelling. She has been taking Naproxen for treatment and is requesting a prescription for an oral steroid for the edema. There are no modifying factors noted. Patient is here for further evaluation and treatment.   Past Medical History:  Diagnosis Date  . Allergic rhinitis   . Anemia   . Anginal pain ([place])    Chest Pains 8/16  . Eczema   . Environmental allergies   . Essential hypertension 07/07/2014  . GERD (gastroesophageal reflux disease)   . Heart murmur   . High cholesterol   . Hyperlipidemia 07/07/2014  . Hypertension   . Hypoglycemia   . Hypoglycemia   . Plantar fasciitis   . S/P TAH (total abdominal hysterectomy) 03/10/2015  . Sinusitis, chronic   . Vasovagal syncope 07/07/2014     Physical Exam: General: The patient is alert and oriented x3 in no acute distress.  Dermatology: Skin is warm, dry and supple bilateral lower extremities. Negative for open lesions or macerations.  Vascular: Edema noted to the left foot. Palpable pedal pulses bilaterally. No erythema noted. Capillary refill within normal limits.  Neurological: Epicritic and protective threshold grossly intact bilaterally.   Musculoskeletal Exam: Range of motion within normal limits to all pedal and ankle joints bilateral. Muscle strength 5/5 in all groups bilateral.   Assessment: 1. H/o left dorsal foot bone spur surgery 2007  2. Edema left foot    Plan of Care:  1. Patient evaluated.   2. Compression anklet dispensed.  3. Prescription for Medrol Dose Pak provided to patient. 4. Prescription for Meloxicam provided to patient. 5. Return to clinic as needed.       Edrick Kins, DPM [facility]  Dr. Edrick Kins, DPM    [address], [place] [address]                Office [phone]  Fax [phone]

## 2019-07-30 DIAGNOSIS — R42 Dizziness and giddiness: Secondary | ICD-10-CM | POA: Diagnosis not present

## 2019-07-30 DIAGNOSIS — R079 Chest pain, unspecified: Secondary | ICD-10-CM | POA: Diagnosis not present

## 2019-07-30 DIAGNOSIS — F419 Anxiety disorder, unspecified: Secondary | ICD-10-CM | POA: Diagnosis not present

## 2019-08-06 DIAGNOSIS — R110 Nausea: Secondary | ICD-10-CM | POA: Diagnosis not present

## 2019-08-06 DIAGNOSIS — F419 Anxiety disorder, unspecified: Secondary | ICD-10-CM | POA: Diagnosis not present

## 2019-08-11 MED FILL — DEXILANT DR 60 MG CAPSULE: 60 | 30 days supply | Qty: 30 | Fill #10

## 2019-08-13 MED FILL — PANTOPRAZOLE SOD DR 40 MG T: 40 | 90 days supply | Qty: 90 | Fill #0

## 2019-08-13 MED FILL — AMLODIPINE BESYLATE 5 MG TA: 5 | 90 days supply | Qty: 90 | Fill #0

## 2019-08-19 MED FILL — CELECOXIB 200 MG CAP: 200 | 90 days supply | Qty: 90 | Fill #0

## 2019-09-07 ENCOUNTER — Other Ambulatory Visit: Payer: Self-pay | Admitting: Family Medicine

## 2019-09-07 DIAGNOSIS — Z1231 Encounter for screening mammogram for malignant neoplasm of breast: Secondary | ICD-10-CM

## 2019-09-14 MED FILL — DEXILANT DR 60 MG CAPSULE: 60 | 90 days supply | Qty: 90 | Fill #0

## 2019-09-22 MED FILL — IRBESARTAN 300 MG TAB: 300 | 30 days supply | Qty: 30 | Fill #2

## 2019-09-28 ENCOUNTER — Other Ambulatory Visit: Payer: Self-pay | Admitting: Family Medicine

## 2019-09-28 DIAGNOSIS — Z1231 Encounter for screening mammogram for malignant neoplasm of breast: Secondary | ICD-10-CM

## 2019-09-29 ENCOUNTER — Encounter: Payer: Self-pay | Admitting: Orthopaedic Surgery

## 2019-09-29 ENCOUNTER — Ambulatory Visit (INDEPENDENT_AMBULATORY_CARE_PROVIDER_SITE_OTHER): Payer: 59

## 2019-09-29 ENCOUNTER — Ambulatory Visit: Payer: 59 | Admitting: Orthopaedic Surgery

## 2019-09-29 DIAGNOSIS — M542 Cervicalgia: Secondary | ICD-10-CM | POA: Diagnosis not present

## 2019-09-29 DIAGNOSIS — G8929 Other chronic pain: Secondary | ICD-10-CM

## 2019-09-29 DIAGNOSIS — M25511 Pain in right shoulder: Secondary | ICD-10-CM | POA: Diagnosis not present

## 2019-09-29 MED ORDER — PREDNISONE 10 MG (21) PO TBPK: ORAL_TABLET | ORAL | 0 refills | Status: DC

## 2019-09-29 MED ORDER — METHOCARBAMOL 500 MG PO TABS: 500.0000 mg | ORAL_TABLET | Freq: Two times a day (BID) | ORAL | 0 refills | Status: DC | PRN

## 2019-09-29 MED FILL — predniSONE 10 MG (21) TBPK: 10 | 6 days supply | Qty: 21 | Fill #0

## 2019-09-29 MED FILL — METHOCARBAMOL 500 MG TABS: 500 | 15 days supply | Qty: 30 | Fill #0

## 2019-09-29 NOTE — Progress Notes (Signed)
Office Visit Note   Patient: Heather Rios           Date of Birth: 09/15/1965           MRN: [id number] Visit Date: 09/29/2019              Requested by: Maury Dus, MD [place],  [place] [address] PCP: Maury Dus, MD   Assessment & Plan: Visit Diagnoses:  1. Neck pain   2. Chronic right shoulder pain     Plan: Impression is right-sided cervical spine radiculopathy.  We will start the patient with Sterapred taper muscle relaxer.  Have also sent in an internal referral for physical therapy.  Should her symptoms not continue to improve, she will call us and let us know.  Follow-Up Instructions: Return if symptoms worsen or fail to improve.   Orders:  Orders Placed This Encounter  Procedures  . XR Cervical Spine 2 or 3 views  . XR Shoulder Right  . Ambulatory referral to Physical Therapy   Meds ordered this encounter  Medications  . predniSONE (STERAPRED UNI-PAK 21 TAB) 10 MG (21) TBPK tablet    Sig: Take as directed    Dispense:  21 tablet    Refill:  0  . methocarbamol (ROBAXIN) 500 MG tablet    Sig: Take 1 tablet (500 mg total) by mouth 2 (two) times daily as needed for muscle spasms.    Dispense:  30 tablet    Refill:  0      Procedures: No procedures performed   Clinical Data: No additional findings.   Subjective: Chief Complaint  Patient presents with  . Right Shoulder - Pain  . Neck - Pain    HPI patient is a pleasant 54 year old female who comes in today with right shoulder pain.  This began about 2 months ago without any specific injury.  She does note that she works in Barrister's clerk at [employer] and has been busier over the past few months.  The majority of her pain is to the top of the shoulder which does occasionally radiate into the deltoid and into the parascapular region.  She describes this as a constant ache and stiffness worse with abduction of the shoulder.  She has increased pain at night as well as  with working.  She has tried heat, Tylenol and Celebrex without significant relief of symptoms.  She does note occasional tingling to the right hand.  Of note, she does have a history of carpal tunnel syndrome on the right.  Review of Systems as detailed in HPI.  All others reviewed and are negative.   Objective: Vital Signs: LMP 01/10/2015 (Exact Date)   Physical Exam well-developed well-nourished female in no acute distress.  Alert and oriented x3.  Ortho Exam examination of the right shoulder reveals full active range of motion in all planes.  Negative empty can and cross body adduction.  No tenderness to the [facility] joint.  Cervical spine reveals no spinous tenderness.  She does have stiffness throughout the entire neck with range of motion.  She has tenderness to the parascapular region both sides.  No focal weakness.  She is neurovascular intact distally.  Specialty Comments:  No specialty comments available.  Imaging: XR Cervical Spine 2 or 3 views  Result Date: 09/29/2019 X-rays demonstrate straightening of the cervical spine.  Marked degenerative changes C4-5 and C5-6    PMFS History: Patient Active Problem List   Diagnosis Date  Noted  . S/P TAH (total abdominal hysterectomy) 03/10/2015  . Vasovagal syncope 07/07/2014  . Essential hypertension 07/07/2014  . Hyperlipidemia 07/07/2014   Past Medical History:  Diagnosis Date  . Allergic rhinitis   . Anemia   . Anginal pain ([place])    Chest Pains 8/16  . Eczema   . Environmental allergies   . Essential hypertension 07/07/2014  . GERD (gastroesophageal reflux disease)   . Heart murmur   . High cholesterol   . Hyperlipidemia 07/07/2014  . Hypertension   . Hypoglycemia   . Hypoglycemia   . Plantar fasciitis   . S/P TAH (total abdominal hysterectomy) 03/10/2015  . Sinusitis, chronic   . Vasovagal syncope 07/07/2014    Family History  Problem Relation Age of Onset  . Heart failure Mother   . Hyperlipidemia Mother   .  Heart attack Mother 70  . Hyperlipidemia Father   . Kidney failure Father        kidney transplant receipiant  . Diabetes Other   . High blood pressure Other   . Breast cancer Neg Hx   . Colon cancer Neg Hx   . Colon polyps Neg Hx   . Esophageal cancer Neg Hx   . Rectal cancer Neg Hx   . Stomach cancer Neg Hx     Past Surgical History:  Procedure Laterality Date  . ABDOMINAL HYSTERECTOMY N/A 03/10/2015   Procedure: HYSTERECTOMY ABDOMINAL;  Surgeon: Thurnell Lose, MD;  Location: [facility];  Service: Gynecology;  Laterality: N/A;  . BILATERAL SALPINGECTOMY Bilateral 03/10/2015   pt denies  . CHOLECYSTECTOMY    . FOOT SURGERY     Social History   Occupational History  . Not on file  Tobacco Use  . Smoking status: Never Smoker  . Smokeless tobacco: Never Used  Substance and Sexual Activity  . Alcohol use: No  . Drug use: No  . Sexual activity: Yes    Birth control/protection: Implant

## 2019-09-30 ENCOUNTER — Other Ambulatory Visit: Payer: Self-pay | Admitting: Physician Assistant

## 2019-09-30 ENCOUNTER — Telehealth: Payer: Self-pay | Admitting: Physician Assistant

## 2019-09-30 MED ORDER — CELECOXIB 200 MG PO CAPS: 200.0000 mg | ORAL_CAPSULE | Freq: Two times a day (BID) | ORAL | 3 refills | Status: DC

## 2019-09-30 MED FILL — CELECOXIB 200 MG CAP: 200 | 15 days supply | Qty: 30 | Fill #0

## 2019-09-30 NOTE — Telephone Encounter (Signed)
Patient called advised the methocarbamol is not helping with the pain. Patient asked if she can get a Rx for Celebrex or Tramadol? Patient said either one has helped with the pain in the past. Patient uses [facility].  The number to contact patient is [phone]

## 2019-09-30 NOTE — Telephone Encounter (Signed)
If so, please send into pharm. Thanks.  

## 2019-09-30 NOTE — Telephone Encounter (Signed)
Just sent in celebrex

## 2019-09-30 NOTE — Telephone Encounter (Signed)
Patient aware.

## 2019-10-05 ENCOUNTER — Other Ambulatory Visit: Payer: Self-pay

## 2019-10-05 ENCOUNTER — Ambulatory Visit
Admission: RE | Admit: 2019-10-05 | Discharge: 2019-10-05 | Disposition: A | Discharge status: Home / Self Care | Payer: 59 | Source: Ambulatory Visit | Attending: Family Medicine | Admitting: Family Medicine

## 2019-10-05 DIAGNOSIS — Z1231 Encounter for screening mammogram for malignant neoplasm of breast: Secondary | ICD-10-CM | POA: Diagnosis not present

## 2019-10-07 DIAGNOSIS — J310 Chronic rhinitis: Secondary | ICD-10-CM | POA: Diagnosis not present

## 2019-10-07 DIAGNOSIS — J328 Other chronic sinusitis: Secondary | ICD-10-CM | POA: Diagnosis not present

## 2019-10-07 DIAGNOSIS — H9202 Otalgia, left ear: Secondary | ICD-10-CM | POA: Diagnosis not present

## 2019-10-07 DIAGNOSIS — M542 Cervicalgia: Secondary | ICD-10-CM | POA: Diagnosis not present

## 2019-10-08 MED FILL — PRAVASTATIN NA 40 MG TAB: 40 | 90 days supply | Qty: 90 | Fill #1

## 2019-10-15 ENCOUNTER — Ambulatory Visit: Payer: 59

## 2019-10-15 DIAGNOSIS — J309 Allergic rhinitis, unspecified: Secondary | ICD-10-CM | POA: Diagnosis not present

## 2019-10-15 DIAGNOSIS — R21 Rash and other nonspecific skin eruption: Secondary | ICD-10-CM | POA: Diagnosis not present

## 2019-10-15 DIAGNOSIS — J328 Other chronic sinusitis: Secondary | ICD-10-CM | POA: Diagnosis not present

## 2019-10-15 MED FILL — AZELASTINE 0.15% NASAL SPRY: 0.15 | 50 days supply | Qty: 30 | Fill #0

## 2019-10-15 MED FILL — MUPIROCIN 2% OINTMENT: 2 | 5 days supply | Qty: 22 | Fill #0

## 2019-10-26 ENCOUNTER — Ambulatory Visit: Payer: 59 | Attending: Physician Assistant | Admitting: Rehabilitative and Restorative Service Providers"

## 2019-10-26 ENCOUNTER — Other Ambulatory Visit: Payer: Self-pay

## 2019-10-26 ENCOUNTER — Encounter: Payer: Self-pay | Admitting: Rehabilitative and Restorative Service Providers"

## 2019-10-26 DIAGNOSIS — M62830 Muscle spasm of back: Secondary | ICD-10-CM | POA: Insufficient documentation

## 2019-10-26 DIAGNOSIS — M545 Low back pain: Secondary | ICD-10-CM | POA: Insufficient documentation

## 2019-10-26 DIAGNOSIS — M542 Cervicalgia: Secondary | ICD-10-CM | POA: Insufficient documentation

## 2019-10-26 DIAGNOSIS — R293 Abnormal posture: Secondary | ICD-10-CM | POA: Diagnosis not present

## 2019-10-26 MED FILL — IRBESARTAN 300 MG TAB: 300 | 30 days supply | Qty: 30 | Fill #3

## 2019-10-26 NOTE — Therapy (Signed)
[place], [name], [address] Phone: [phone]   Fax:  [phone]  Physical Therapy Evaluation  Patient Details  Name: Heather Rios MRN: [id number] Date of Birth: Nov 20, 1965 Referring Provider (PT): Tiney Rouge, [place]   Encounter Date: 10/26/2019   PT End of Session - 10/26/19 1559    Visit Number 1    Number of Visits 10    Date for PT Re-Evaluation 12/11/19    Authorization Type UMR    Progress Note Due on Visit 10    PT Start Time 0250    PT Stop Time 0333    PT Time Calculation (min) 43 min    Activity Tolerance Patient tolerated treatment well;No increased pain    Behavior During Therapy WFL for tasks assessed/performed           Past Medical History:  Diagnosis Date  . Allergic rhinitis   . Anemia   . Anginal pain ([place])    Chest Pains 8/16  . Eczema   . Environmental allergies   . Essential hypertension 07/07/2014  . GERD (gastroesophageal reflux disease)   . Heart murmur   . High cholesterol   . Hyperlipidemia 07/07/2014  . Hypertension   . Hypoglycemia   . Hypoglycemia   . Plantar fasciitis   . S/P TAH (total abdominal hysterectomy) 03/10/2015  . Sinusitis, chronic   . Vasovagal syncope 07/07/2014    Past Surgical History:  Procedure Laterality Date  . ABDOMINAL HYSTERECTOMY N/A 03/10/2015   Procedure: HYSTERECTOMY ABDOMINAL;  Surgeon: Thurnell Lose, MD;  Location: [facility];  Service: Gynecology;  Laterality: N/A;  . BILATERAL SALPINGECTOMY Bilateral 03/10/2015   pt denies  . CHOLECYSTECTOMY    . FOOT SURGERY      There were no vitals filed for this visit.    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 0/10 pain now up to a 6-8/10 after a hard hospital shift.    Pertinent History  prior history of LBP treated by PT    Limitations Lifting    How long can you sit comfortably? indeterminate amount of time    How long can you stand comfortably? indeterminate amount of time    How long can you walk comfortably? 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 No/denies    Pain Radiating Towards r shoulder    Pain Onset More than a month ago    Pain Frequency Intermittent    Aggravating Factors  lifting, performing work duties    Pain Relieving Factors rest, Voltaren    Effect of Pain on Daily Activities pain with work and after  a hard work day    Multiple Pain Sites No              [facility] PT Assessment - 10/26/19 0001      Assessment   Medical Diagnosis cervicalgia with R UE radiculopathy    Referring Provider (PT) Tiney Rouge, PA-C    Onset Date/Surgical Date 08/18/19    Hand Dominance Right    Next MD Visit TBD    Prior  Therapy 2020 for LBP      Precautions   Precautions None      Restrictions   Weight Bearing Restrictions No      Balance Screen   Has the patient fallen in the past 6 months No      [place] residence      Prior Function   Level of Independence Independent      Cognition   Overall Cognitive Status Within Functional Limits for tasks assessed      Observation/Other Assessments   Focus on Therapeutic Outcomes (FOTO)  32% limited      Sensation   Light Touch Appears Intact      Coordination   Gross Motor Movements are Fluid and Coordinated Yes      Posture/Postural Control   Posture Comments R Upper Trap higher      ROM / Strength   AROM / PROM / Strength AROM;PROM;Strength      AROM   Overall AROM Comments Bil Shoulder AROM WNL; cervical flexion 70, ext 35, R rot 85, L rot 60.      PROM   Overall PROM  Comments R scapula decreased upward rotation with abduction; R scapula increased winging with R UE flex/ext      Strength   Overall Strength Comments Bil UE strength 4+/5      Palpation   Palpation comment R Upper Trap/Rhomboid tightness; R Lat tightness, Decreased scapular mobility as indicated above with UE abdct/flexion      Special Tests   Other special tests Cervical Compression/distraction -                      Objective measurements completed on examination: See above findings.               PT Education - 10/26/19 1557    Education Details Access Code: [email or web address]: [email or web address]: 08/09/2021Prepared by: Deborra Medina ArtisExercisesChild's Pose Stretch - 1 x daily - 7 x weekly - 1 sets - 2 reps - 30 sec holdChild's Pose with Sidebending - 1 x daily - 7 x weekly - 1 sets - 1 reps - 30 sec holdChild's Pose with Thread the Needle - 1 x daily - 7 x weekly - 1 sets - 1 reps - 30 sec holdGentle Levator Scapulae Stretch - 1 x daily - 7 x weekly - 1 sets - 2 reps - 30 sec holdSeated Cervical Flexion AROM - 1 x daily - 7 x weekly - 1 sets - 2 reps - 30 sec hold    Person(s) Educated Patient    Methods Explanation;Demonstration;Handout;Other (comment)   text   Comprehension Verbalized understanding;Returned demonstration               PT Long Term Goals - 10/26/19 1604      PT LONG TERM GOAL #1   Title She will be independnet with all HEP issued for pain management    Baseline issued at eval    Period Weeks    Status New    Target Date 12/11/19      PT LONG TERM GOAL #2   Title Pt will report pain decr 75% or more with work activities    Baseline n/a    Time 4    Period Weeks    Status New    Target Date 12/11/19      PT LONG TERM GOAL #3   Title Pt will be able to lift 30 pounds  and walk x 1 min with </= 2/10 cervical pain and with proper posture    Baseline unmet    Time 4    Period Weeks    Status New    Target Date 12/11/19       PT LONG TERM GOAL #5   Title Pt will have improved foto score to </=28% limitation    Baseline 32%    Time 4    Period Weeks    Status New    Target Date 12/11/19                  Plan - 10/26/19 1600    Clinical Impression Statement Pt presents to PT with R Upper Trap/Lat/Rhomboid tightness with scapular immobility. Pt is a sterile processing employee with [employer] and needs to manage heavy cleaning and lifting. She has had R cervical and shoulder pain since June 2021 when work levels picked up. Pt would benefit from PT for manual therapy to address tightness and scapular mobility, strengthening to address stability of above areas, and modalities as needed. Pt will be out of town week of    Personal Factors and Comorbidities Profession    Examination-Activity Limitations Lift   work activities   Examination-Participation Restrictions Cleaning;Occupation    Stability/Clinical Decision Making Stable/Uncomplicated    Clinical Decision Making Low    Rehab Potential Excellent    PT Frequency 2x / week    PT Duration 4 weeks    PT Treatment/Interventions Ultrasound;Moist Heat;Electrical Stimulation;Functional mobility training;Neuromuscular re-education;Therapeutic exercise;Therapeutic activities;Patient/family education;Manual techniques;Passive range of motion;Dry needling;Taping    PT Next Visit Plan review HEP; manual therapy to R scapula and R Upper Trap/Rhomobid/Lat    PT Home Exercise Plan Access Code: [email or web address]: [email or web address]: 08/09/2021Prepared by: Deborra Medina ArtisExercisesChild's Pose Stretch - 1 x daily - 7 x weekly - 1 sets - 2 reps - 30 sec holdChild's Pose with Sidebending - 1 x daily - 7 x weekly - 1 sets - 1 reps - 30 sec holdChild's Pose with Thread the Needle - 1 x daily - 7 x weekly - 1 sets - 1 reps - 30 sec holdGentle Levator Scapulae Stretch - 1 x daily - 7 x weekly - 1 sets - 2 reps - 30 sec holdSeated Cervical Flexion AROM - 1 x daily - 7 x  weekly - 1 sets - 2 reps - 30 sec hold    Consulted and Agree with Plan of Care Patient           Patient will benefit from skilled therapeutic intervention in order to improve the following deficits and impairments:  Pain, Decreased activity tolerance, Decreased strength, Increased muscle spasms, Postural dysfunction, Hypomobility, Impaired flexibility  Visit Diagnosis: Abnormal posture  Cervicalgia     Problem List Patient Active Problem List   Diagnosis Date Noted  . S/P TAH (total abdominal hysterectomy) 03/10/2015  . Vasovagal syncope 07/07/2014  . Essential hypertension 07/07/2014  . Hyperlipidemia 07/07/2014    Myra Rude, PT 10/26/2019, 4:09 PM  [facility] [address] [place], [name], [address] Phone: [phone]   Fax:  [phone]  Name: Heather Rios MRN: [id number] Date of Birth: 1966-02-26

## 2019-10-28 MED FILL — TRIAMCINOLONE ACETONIDE 0.1: 0.1 | 20 days supply | Qty: 80 | Fill #0

## 2019-10-30 ENCOUNTER — Ambulatory Visit (INDEPENDENT_AMBULATORY_CARE_PROVIDER_SITE_OTHER): Payer: 59 | Admitting: Otolaryngology

## 2019-10-30 ENCOUNTER — Other Ambulatory Visit: Payer: Self-pay

## 2019-10-30 ENCOUNTER — Encounter (INDEPENDENT_AMBULATORY_CARE_PROVIDER_SITE_OTHER): Payer: Self-pay | Admitting: Otolaryngology

## 2019-10-30 VITALS — Temp 97.3°F

## 2019-10-30 DIAGNOSIS — J343 Hypertrophy of nasal turbinates: Secondary | ICD-10-CM

## 2019-10-30 DIAGNOSIS — J310 Chronic rhinitis: Secondary | ICD-10-CM

## 2019-10-30 NOTE — Progress Notes (Signed)
HPI: Heather Rios is a 54 y.o. female who presents is referred by Andrey Cota, PA for evaluation of chronic nasal congestion.  She presents here for a second opinion.  Apparently she saw another doctor who recommended turbinate reductions.  She had a CT scan that I reviewed that was performed in February of this year.  This showed clear paranasal sinuses.  She had moderate rhinitis with large turbinates.  No polyps.  No obstruction of the sinuses.. She has tried Flonase but this sometimes causes the right nostril to bleed.  She also uses saline rinse.  She has also tried Zyrtec and Xyzal.  Past Medical History:  Diagnosis Date   Allergic rhinitis    Anemia    Anginal pain ([place])    Chest Pains 8/16   Eczema    Environmental allergies    Essential hypertension 07/07/2014   GERD (gastroesophageal reflux disease)    Heart murmur    High cholesterol    Hyperlipidemia 07/07/2014   Hypertension    Hypoglycemia    Hypoglycemia    Plantar fasciitis    S/P TAH (total abdominal hysterectomy) 03/10/2015   Sinusitis, chronic    Vasovagal syncope 07/07/2014   Past Surgical History:  Procedure Laterality Date   ABDOMINAL HYSTERECTOMY N/A 03/10/2015   Procedure: HYSTERECTOMY ABDOMINAL;  Surgeon: Thurnell Lose, MD;  Location: [facility];  Service: Gynecology;  Laterality: N/A;   BILATERAL SALPINGECTOMY Bilateral 03/10/2015   pt denies   CHOLECYSTECTOMY     FOOT SURGERY     Social History   Socioeconomic History   Marital status: Single    Spouse name: Not on file   Number of children: 1   Years of education: Not on file   Highest education level: Not on file  Occupational History   Not on file  Tobacco Use   Smoking status: Never Smoker   Smokeless tobacco: Never Used  Substance and Sexual Activity   Alcohol use: No   Drug use: No   Sexual activity: Yes    Birth control/protection: Implant  Other Topics Concern   Not on file  Social History Narrative    Not on file   Social Determinants of Health   Financial Resource Strain:    Difficulty of Paying Living Expenses:   Food Insecurity:    Worried About Charity fundraiser in the Last Year:    Arboriculturist in the Last Year:   Transportation Needs:    Film/video editor (Medical):    Lack of Transportation (Non-Medical):   Physical Activity:    Days of Exercise per Week:    Minutes of Exercise per Session:   Stress:    Feeling of Stress :   Social Connections:    Frequency of Communication with Friends and Family:    Frequency of Social Gatherings with Friends and Family:    Attends Religious Services:    Active Member of Clubs or Organizations:    Attends Music therapist:    Marital Status:    Family History  Problem Relation Age of Onset   Heart failure Mother    Hyperlipidemia Mother    Heart attack Mother 64   Hyperlipidemia Father    Kidney failure Father        kidney transplant receipiant   Diabetes Other    High blood pressure Other    Breast cancer Neg Hx    Colon cancer Neg Hx    Colon polyps Neg Hx  Esophageal cancer Neg Hx    Rectal cancer Neg Hx    Stomach cancer Neg Hx    Allergies  Allergen Reactions   Contrast Media [Iodinated Diagnostic Agents] Anaphylaxis   Adhesive [Tape] Other (See Comments)    Skin irritation   Augmentin [Amoxicillin-Pot Clavulanate] Hives    Has patient had a PCN reaction causing immediate rash, facial/tongue/throat swelling, SOB or lightheadedness with hypotension: No Has patient had a PCN reaction causing severe rash involving mucus membranes or skin necrosis: No Has patient had a PCN reaction that required hospitalization No Has patient had a PCN reaction occurring within the last 10 years: Yes If all of the above answers are "NO", then may proceed with Cephalosporin use.    Eggs Or Egg-Derived Products Hives and Rash   Hydrocodone Bitartrate Er Itching   Latex  Itching and Rash   Nexium [Esomeprazole] Other (See Comments)    headahce   Sudafed [Pseudoephedrine Hcl] Palpitations   Prior to Admission medications   Medication Sig Start Date End Date Taking? Authorizing Provider  amLODipine (NORVASC) 5 MG tablet Take 5 mg by mouth daily. 08/21/17  Yes [provider]  azelastine (ASTELIN) 0.1 % nasal spray Place 1 spray into both nostrils 2 (two) times daily as needed for allergies.    Yes [provider]  celecoxib (CELEBREX) 200 MG capsule Take 1 capsule (200 mg total) by mouth 2 (two) times daily. 09/30/19  Yes Aundra Dubin, PA-C  cetirizine (ZYRTEC) 10 MG tablet Take 10 mg by mouth at bedtime as needed for allergies.    Yes [provider]  dexlansoprazole (DEXILANT) 60 MG capsule Take 60 mg by mouth daily.   Yes [provider]  diclofenac sodium (VOLTAREN) 1 % GEL Apply 2 g topically 4 (four) times daily. 06/24/17  Yes Leandrew Koyanagi, MD  EPINEPHrine 0.3 mg/0.3 mL IJ SOAJ injection See admin instructions. 08/07/16  Yes [provider]  Fexofenadine HCl (MUCINEX ALLERGY PO) Take 1,200 mg by mouth.    Yes [provider]  fluticasone (FLONASE) 50 MCG/ACT nasal spray  07/21/15  Yes [provider]  irbesartan (AVAPRO) 300 MG tablet Take 300 mg by mouth every morning. 07/29/17  Yes [provider]  levocetirizine (XYZAL) 5 MG tablet Take 1 tablet (5 mg total) by mouth every evening. 02/04/15  Yes Blima Singer, NP  meloxicam (MOBIC) 15 MG tablet Take 1 tablet (15 mg total) by mouth daily. 07/13/19  Yes Edrick Kins, DPM  methocarbamol (ROBAXIN) 500 MG tablet Take 1 tablet (500 mg total) by mouth 2 (two) times daily as needed for muscle spasms. 09/29/19  Yes Aundra Dubin, PA-C  methylPREDNISolone (MEDROL DOSEPAK) 4 MG TBPK tablet 6 day dose pack - take as directed 07/13/19  Yes Edrick Kins, DPM  montelukast (SINGULAIR) 10 MG tablet Take 10 mg by mouth at bedtime as needed (for  allergies).    Yes [provider]  mupirocin ointment (BACTROBAN) 2 % Apply 1 application topically 2 (two) times daily. 04/27/16  Yes Fisher, Linden Dolin, PA-C  ofloxacin (OCUFLOX) 0.3 % ophthalmic solution  06/27/15  Yes [provider]  Olopatadine HCl 0.2 % SOLN INSTILL 1 DROP INTO EACH EYE ONCE A DAY, AS NEEDED 05/15/17  Yes [provider]  pantoprazole (PROTONIX) 40 MG tablet Take 40 mg by mouth daily.  06/14/16  Yes [provider]  pravastatin (PRAVACHOL) 40 MG tablet Take 40 mg by mouth daily.  Yes [provider]  predniSONE (STERAPRED UNI-PAK 21 TAB) 10 MG (21) TBPK tablet Take as directed 09/29/19  Yes Aundra Dubin, PA-C  triamcinolone cream (KENALOG) 0.1 % Apply 1 application topically 2 (two) times daily as needed (itchy skin).    Yes [provider]  zinc gluconate 50 MG tablet Take 50 mg by mouth daily.    Yes [provider]     Positive ROS: Otherwise negative  All other systems have been reviewed and were otherwise negative with the exception of those mentioned in the HPI and as above.  Physical Exam: Constitutional: Alert, well-appearing, no acute distress Ears: External ears without lesions or tenderness. Ear canals are clear bilaterally with intact, clear TMs.  Nasal: External nose without lesions. Septum midline with mild rhinitis.  She has generous size turbinates bilaterally.  After decongesting the nose both nasal passages were clear there were no polyps.  Both middle meatus regions were clear..  After decongesting the nose with Afrin she felt much better and breathe much easier. Oral: Lips and gums without lesions. Tongue and palate mucosa without lesions. Posterior oropharynx clear. Neck: No palpable adenopathy or masses Respiratory: Breathing comfortably  Skin: No facial/neck lesions or rash noted.  Procedures  Assessment Chronic rhinitis with intermittent nasal obstruction.  Plan: Discussed with  her concerning medical and surgical options.  For medical therapy would recommend regular use of nasal steroid spray in suggested trying Nasacort since the Flonase is caused occasional nosebleeds.  Prescribed Nasacort 2 sprays each nostril at night.  She can take antihistamines as needed.  Suggested using saline irrigation for excessive mucus buildup. If she does not get adequate relief with regular use of medical therapy she would benefit from turbinate reductions to help enlarge intranasal cavity and reduce turbinate size. Also discussed whether concerning occasional use of Afrin but no more than once or twice a week if congestion is bad.  As this is an addictive type spray and reviewed this with her.   Radene Journey, MD   CC:

## 2019-11-08 MED FILL — AMLODIPINE BESYLATE 5 MG TA: 5 | 90 days supply | Qty: 90 | Fill #1

## 2019-11-10 ENCOUNTER — Ambulatory Visit: Payer: 59

## 2019-11-10 ENCOUNTER — Other Ambulatory Visit: Payer: Self-pay

## 2019-11-10 DIAGNOSIS — M545 Low back pain, unspecified: Secondary | ICD-10-CM

## 2019-11-10 DIAGNOSIS — M542 Cervicalgia: Secondary | ICD-10-CM | POA: Diagnosis not present

## 2019-11-10 DIAGNOSIS — M62830 Muscle spasm of back: Secondary | ICD-10-CM

## 2019-11-10 DIAGNOSIS — R293 Abnormal posture: Secondary | ICD-10-CM

## 2019-11-10 NOTE — Therapy (Signed)
[place], [name], [address] Phone: [phone]   Fax:  [phone]  Physical Therapy Treatment  Patient Details  Name: Heather Rios MRN: [id number] Date of Birth: 09-May-1965 Referring Provider (PT): Tiney Rouge, [place]   Encounter Date: 11/10/2019   PT End of Session - 11/10/19 1713    Visit Number 2    Number of Visits 10    Date for PT Re-Evaluation 12/11/19    Progress Note Due on Visit 10    PT Start Time 1622    PT Stop Time 1705    PT Time Calculation (min) 43 min    Activity Tolerance Patient tolerated treatment well;No increased pain    Behavior During Therapy WFL for tasks assessed/performed           Past Medical History:  Diagnosis Date  . Allergic rhinitis   . Anemia   . Anginal pain ([place])    Chest Pains 8/16  . Eczema   . Environmental allergies   . Essential hypertension 07/07/2014  . GERD (gastroesophageal reflux disease)   . Heart murmur   . High cholesterol   . Hyperlipidemia 07/07/2014  . Hypertension   . Hypoglycemia   . Hypoglycemia   . Plantar fasciitis   . S/P TAH (total abdominal hysterectomy) 03/10/2015  . Sinusitis, chronic   . Vasovagal syncope 07/07/2014    Past Surgical History:  Procedure Laterality Date  . ABDOMINAL HYSTERECTOMY N/A 03/10/2015   Procedure: HYSTERECTOMY ABDOMINAL;  Surgeon: Thurnell Lose, MD;  Location: [facility];  Service: Gynecology;  Laterality: N/A;  . BILATERAL SALPINGECTOMY Bilateral 03/10/2015   pt denies  . CHOLECYSTECTOMY    . FOOT SURGERY      There were no vitals filed for this visit.   Subjective Assessment - 11/10/19 1629    Subjective Pt reports a busy day at work and and her Bilat neck and upper shoulder and R [place] area are hurting more.    Currently in Pain? Yes    Pain Score 6     Pain Location Neck   upper shoulder   Pain Orientation Right;Left;Lateral    Pain Type Acute pain    Pain Radiating Towards R shoulder     Pain Onset More than a month ago    Pain Frequency Intermittent                             [facility] Adult PT Treatment/Exercise - 11/10/19 0001      Exercises   Exercises Neck;Shoulder      Neck Exercises: Seated   Neck Retraction 10 reps;3 secs      Shoulder Exercises: Seated   Retraction Strengthening;10 reps    Retraction Limitations 3 sec      Manual Therapy   Manual Therapy Soft tissue mobilization;Manual Traction    Soft tissue mobilization STW to the upper traps c igger pint release; R much more tender and tight than L    Manual Traction Axial cervical traction       Neck Exercises: Stretches   Upper Trapezius Stretch Right;Left;2 reps;20 seconds    Levator Stretch Right;Left;2 reps;20 seconds                  PT Education - 11/10/19 1711    Education Details HEP; Negative impact of poor posture and daily activities on muscle tension and pain; massage cane; heating pad/hot shower  Person(s) Educated Patient    Methods Explanation;Demonstration;Tactile cues;Verbal cues;Handout    Comprehension Verbalized understanding;Returned demonstration;Verbal cues required;Tactile cues required;Need further instruction               PT Long Term Goals - 10/26/19 1604      PT LONG TERM GOAL #1   Title She will be independnet with all HEP issued for pain management    Baseline issued at eval    Period Weeks    Status New    Target Date 12/11/19      PT LONG TERM GOAL #2   Title Pt will report pain decr 75% or more with work activities    Baseline n/a    Time 4    Period Weeks    Status New    Target Date 12/11/19      PT LONG TERM GOAL #3   Title Pt will be able to lift 30 pounds and walk x 1 min with </= 2/10 cervical pain and with proper posture    Baseline unmet    Time 4    Period Weeks    Status New    Target Date 12/11/19      PT LONG TERM GOAL #5   Title Pt will have improved foto score to </=28% limitation    Baseline  32%    Time 4    Period Weeks    Status New    Target Date 12/11/19                 Plan - 11/10/19 1715    Clinical Impression Statement PT focused on the negative impact on poor posture and daily activities re: muscle tension and pain and an HEP which can help to combat these negative forces. PT reported understanding and completed exs properly. Additional, STWwas completed to the upper traps and axial cervical traction were completed. Pt reported a reduction in pain and tightness after the PT session.    Personal Factors and Comorbidities Profession    Examination-Activity Limitations Lift    Examination-Participation Restrictions Cleaning;Occupation    Stability/Clinical Decision Making Stable/Uncomplicated    Clinical Decision Making Low    Rehab Potential Excellent    PT Frequency 2x / week    PT Duration 4 weeks    PT Treatment/Interventions Ultrasound;Moist Heat;Electrical Stimulation;Functional mobility training;Neuromuscular re-education;Therapeutic exercise;Therapeutic activities;Patient/family education;Manual techniques;Passive range of motion;Dry needling;Taping    PT Next Visit Plan review HEP; manual therapy; cervical traction    PT Home Exercise Plan Access Code: [email or web address]: [email or web address]: 08/09/2021Prepared by: Deborra Medina ArtisExercisesChild's Pose Stretch - 1 x daily - 7 x weekly - 1 sets - 2 reps - 30 sec holdChild's Pose with Sidebending - 1 x daily - 7 x weekly - 1 sets - 1 reps - 30 sec holdChild's Pose with Thread the Needle - 1 x daily - 7 x weekly - 1 sets - 1 reps - 30 sec holdGentle Levator Scapulae Stretch - 1 x daily - 7 x weekly - 1 sets - 2 reps - 30 sec holdSeated Cervical Flexion AROM - 1 x daily - 7 x weekly - 1 sets - 2 reps - 30 sec hold; upper trap stretch, scapular retraction, cervical retraction           Patient will benefit from skilled therapeutic intervention in order to improve the following deficits and impairments:   Pain, Decreased activity tolerance, Decreased strength, Increased muscle spasms, Postural dysfunction, Hypomobility, Impaired flexibility  Visit Diagnosis: Abnormal posture  Cervicalgia  Muscle spasm of back  Right-sided low back pain without sciatica, unspecified chronicity     Problem List Patient Active Problem List   Diagnosis Date Noted  . S/P TAH (total abdominal hysterectomy) 03/10/2015  . Vasovagal syncope 07/07/2014  . Essential hypertension 07/07/2014  . Hyperlipidemia 07/07/2014    Gar Ponto MS, PT 11/10/19 5:35 PM  [facility] [address] [place], [name], [address] Phone: [phone]   Fax:  [phone]  Name: Heather Rios MRN: [id number] Date of Birth: February 05, 1966

## 2019-11-16 ENCOUNTER — Encounter: Payer: Self-pay | Admitting: Physical Therapy

## 2019-11-16 ENCOUNTER — Ambulatory Visit: Payer: 59 | Admitting: Physical Therapy

## 2019-11-16 ENCOUNTER — Other Ambulatory Visit: Payer: Self-pay

## 2019-11-16 DIAGNOSIS — M545 Low back pain, unspecified: Secondary | ICD-10-CM

## 2019-11-16 DIAGNOSIS — M542 Cervicalgia: Secondary | ICD-10-CM

## 2019-11-16 DIAGNOSIS — R293 Abnormal posture: Secondary | ICD-10-CM | POA: Diagnosis not present

## 2019-11-16 DIAGNOSIS — M62830 Muscle spasm of back: Secondary | ICD-10-CM | POA: Diagnosis not present

## 2019-11-16 MED FILL — TRIAMCINOLONE 0.1% CREAM: 0.1 | 20 days supply | Qty: 80 | Fill #0

## 2019-11-16 MED FILL — AZELASTINE HCL 0.05% DROPS: 0.05 | 30 days supply | Qty: 6 | Fill #1

## 2019-11-16 NOTE — Therapy (Signed)
[place], [name], [address] Phone: [phone]   Fax:  [phone]  Physical Therapy Treatment  Patient Details  Name: Heather Rios MRN: [id number] Date of Birth: [birth year] Referring Provider (PT): Tiney Rouge, [place]   Encounter Date: 11/16/2019   PT End of Session - 11/16/19 1621    Visit Number 3    Number of Visits 10    Date for PT Re-Evaluation 12/11/19    Authorization Type UMR    Progress Note Due on Visit 10    PT Start Time 1536    PT Stop Time 1620    PT Time Calculation (min) 44 min    Activity Tolerance Patient tolerated treatment well;No increased pain    Behavior During Therapy WFL for tasks assessed/performed           Past Medical History:  Diagnosis Date  . Allergic rhinitis   . Anemia   . Anginal pain ([place])    Chest Pains 8/16  . Eczema   . Environmental allergies   . Essential hypertension 07/07/2014  . GERD (gastroesophageal reflux disease)   . Heart murmur   . High cholesterol   . Hyperlipidemia 07/07/2014  . Hypertension   . Hypoglycemia   . Hypoglycemia   . Plantar fasciitis   . S/P TAH (total abdominal hysterectomy) 03/10/2015  . Sinusitis, chronic   . Vasovagal syncope 07/07/2014    Past Surgical History:  Procedure Laterality Date  . ABDOMINAL HYSTERECTOMY N/A 03/10/2015   Procedure: HYSTERECTOMY ABDOMINAL;  Surgeon: Thurnell Lose, MD;  Location: [facility];  Service: Gynecology;  Laterality: N/A;  . BILATERAL SALPINGECTOMY Bilateral 03/10/2015   pt denies  . CHOLECYSTECTOMY    . FOOT SURGERY      There were no vitals filed for this visit.   Subjective Assessment - 11/16/19 1619    Subjective Pt reports a long and busy shift. Pt states she's been doing the exercises at home.    Pertinent History prior history of LBP treated by PT    Limitations Lifting    How long can you sit comfortably? indeterminate amount of time    How long can you stand  comfortably? indeterminate amount of time    How long can you walk comfortably? 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 Yes    Pain Score 6     Pain Location Neck    Pain Descriptors / Indicators Aching    Pain Type Acute pain    Pain Radiating Towards bilat shoulders    Pain Onset More than a month ago                             [facility] Adult PT Treatment/Exercise - 11/16/19 0001      Neck Exercises: Standing   Other Standing Exercises Neck + shoulder retraction x 10      Shoulder Exercises: Standing   External Rotation Strengthening;Both;20 reps;Theraband    Theraband Level (Shoulder External Rotation) Level 3 (Green)    Row Strengthening;Both;20 reps;Theraband    Theraband Level (Shoulder Row) Level 3 (Green)    Other Standing Exercises serratus anterior red tband with foam roll 2x10, serratus hug green tband 2x10    Other Standing Exercises  low trap setting 2x10, mid trap setting 2x10 off wall      Manual Therapy   Manual Therapy Soft tissue mobilization;Manual Traction    Soft tissue mobilization STW to the upper traps c igger pint release; R much more tender and tight than L, occipital release    Manual Traction Axial cervical traction                   PT Education - 11/16/19 1620    Education Details Discussed tennis ball for self trigger point release/self massage. Discussed putting on timer to do postural exercises every hour while at work.    Person(s) Educated Patient    Methods Explanation;Demonstration;Tactile cues;Verbal cues;Handout    Comprehension Verbalized understanding;Verbal cues required;Returned demonstration;Tactile cues required               PT Long Term Goals - 10/26/19 1604      PT LONG TERM GOAL #1   Title She will be  independnet with all HEP issued for pain management    Baseline issued at eval    Period Weeks    Status New    Target Date 12/11/19      PT LONG TERM GOAL #2   Title Pt will report pain decr 75% or more with work activities    Baseline n/a    Time 4    Period Weeks    Status New    Target Date 12/11/19      PT LONG TERM GOAL #3   Title Pt will be able to lift 30 pounds and walk x 1 min with </= 2/10 cervical pain and with proper posture    Baseline unmet    Time 4    Period Weeks    Status New    Target Date 12/11/19      PT LONG TERM GOAL #5   Title Pt will have improved foto score to </=28% limitation    Baseline 32%    Time 4    Period Weeks    Status New    Target Date 12/11/19                 Plan - 11/16/19 1621    Clinical Impression Statement Pt with continued upper trap and neck tightness. PT provided manual therapy and gentle stretching. Treatment focused on progressing pt's scapular and deep neck flexor strengthening. Discussed with pt that she may have soreness and what to do when this is the case.    Personal Factors and Comorbidities Profession    Examination-Activity Limitations Lift    Examination-Participation Restrictions Cleaning;Occupation    Stability/Clinical Decision Making Stable/Uncomplicated    Rehab Potential Excellent    PT Frequency 2x / week    PT Duration 4 weeks    PT Treatment/Interventions Ultrasound;Moist Heat;Electrical Stimulation;Functional mobility training;Neuromuscular re-education;Therapeutic exercise;Therapeutic activities;Patient/family education;Manual techniques;Passive range of motion;Dry needling;Taping    PT Next Visit Plan review HEP; manual therapy; cervical traction    PT Home Exercise Plan Access Code: [email or web address] scapular + neck retraction every hour, low trap setting, dynamic hug, row with green tband    Consulted and Agree with Plan of Care Patient           Patient will benefit from skilled therapeutic  intervention in order to improve the following deficits and impairments:  Pain, Decreased activity tolerance, Decreased strength, Increased muscle spasms, Postural dysfunction, Hypomobility, Impaired flexibility  Visit Diagnosis: Abnormal posture  Cervicalgia  Muscle spasm of back  Right-sided low back pain without sciatica, unspecified chronicity     Problem List Patient Active Problem List   Diagnosis Date Noted  . S/P TAH (total abdominal hysterectomy) 03/10/2015  . Vasovagal syncope 07/07/2014  . Essential hypertension 07/07/2014  . Hyperlipidemia 07/07/2014    Tynleigh Birt April Ma L Blayn Whetsell PT, DPT 11/16/2019, 4:25 PM  [facility] [address] [place], [name], [address] Phone: [phone]   Fax:  [phone]  Name: Heather Rios MRN: [id number] Date of Birth: 08/05/1965

## 2019-11-18 ENCOUNTER — Ambulatory Visit: Payer: 59 | Attending: Physician Assistant

## 2019-11-18 ENCOUNTER — Other Ambulatory Visit: Payer: Self-pay

## 2019-11-18 DIAGNOSIS — M545 Low back pain: Secondary | ICD-10-CM | POA: Diagnosis present

## 2019-11-18 DIAGNOSIS — M62830 Muscle spasm of back: Secondary | ICD-10-CM | POA: Insufficient documentation

## 2019-11-18 DIAGNOSIS — M542 Cervicalgia: Secondary | ICD-10-CM | POA: Insufficient documentation

## 2019-11-18 DIAGNOSIS — R293 Abnormal posture: Secondary | ICD-10-CM | POA: Insufficient documentation

## 2019-11-19 NOTE — Therapy (Signed)
[place], [name], [address] Phone: [phone]   Fax:  [phone]  Physical Therapy Treatment  Patient Details  Name: Heather Rios MRN: [id number] Date of Birth: [birth year] Referring Provider (PT): Tiney Rouge, [place]   Encounter Date: 11/18/2019   PT End of Session - 11/19/19 1039    Visit Number 4    Number of Visits 10    Date for PT Re-Evaluation 12/11/19    Authorization Type UMR    PT Start Time 1534    PT Stop Time 1616    PT Time Calculation (min) 42 min    Activity Tolerance Patient tolerated treatment well;No increased pain    Behavior During Therapy WFL for tasks assessed/performed           Past Medical History:  Diagnosis Date  . Allergic rhinitis   . Anemia   . Anginal pain ([place])    Chest Pains 8/16  . Eczema   . Environmental allergies   . Essential hypertension 07/07/2014  . GERD (gastroesophageal reflux disease)   . Heart murmur   . High cholesterol   . Hyperlipidemia 07/07/2014  . Hypertension   . Hypoglycemia   . Hypoglycemia   . Plantar fasciitis   . S/P TAH (total abdominal hysterectomy) 03/10/2015  . Sinusitis, chronic   . Vasovagal syncope 07/07/2014    Past Surgical History:  Procedure Laterality Date  . ABDOMINAL HYSTERECTOMY N/A 03/10/2015   Procedure: HYSTERECTOMY ABDOMINAL;  Surgeon: Thurnell Lose, MD;  Location: [facility];  Service: Gynecology;  Laterality: N/A;  . BILATERAL SALPINGECTOMY Bilateral 03/10/2015   pt denies  . CHOLECYSTECTOMY    . FOOT SURGERY      There were no vitals filed for this visit.   Subjective Assessment - 11/19/19 1035    Subjective pt reports she is no experiencing pai today following her shift at work    Pertinent History prior history of LBP treated by PT    Limitations Lifting    Patient Stated Goals not to have to take any medicine    Currently in Pain? No/denies    Pain Score 0-No pain    Pain Location Neck   upper  shoulder   Pain Orientation Right;Left;Lateral    Pain Type Acute pain    Pain Onset More than a month ago    Pain Frequency Intermittent    Aggravating Factors  lifting, performing work duties    Pain Relieving Factors rest, Voltaren, HEP    Effect of Pain on Daily Activities Pain at work                             [facility] Adult PT Treatment/Exercise - 11/19/19 0001      Exercises   Exercises Neck;Shoulder      Shoulder Exercises: Seated   Horizontal ABduction AAROM;Both;10 reps    Theraband Level (Shoulder Horizontal ABduction) Level 3 (Green)    Horizontal ABduction Limitations 2 sets    External Rotation Strengthening;Both    External Rotation Limitations 2 sets      Shoulder Exercises: Standing   Other Standing Exercises serratus anterior red tband with foam roll 2x10, serratus hug green tband 2x10    Other Standing Exercises low trap setting 2x10, mid trap setting 2x10 off wall      Manual Therapy   Manual Therapy Soft tissue mobilization;Manual Traction    Soft tissue mobilization STW to the upper  traps c tigger point release; R is more tight and tender than L, occipital release    Manual Traction Axial cervical traction                   PT Education - 11/19/19 1038    Education Details Encourage pt to complete cervical and scapular rettractions periodically at work to reduce postural and work related muscular stresses    Person(s) Educated Patient    Methods Explanation    Comprehension Verbalized understanding               PT Long Term Goals - 10/26/19 1604      PT LONG TERM GOAL #1   Title She will be independnet with all HEP issued for pain management    Baseline issued at eval    Period Weeks    Status New    Target Date 12/11/19      PT LONG TERM GOAL #2   Title Pt will report pain decr 75% or more with work activities    Baseline n/a    Time 4    Period Weeks    Status New    Target Date 12/11/19      PT LONG  TERM GOAL #3   Title Pt will be able to lift 30 pounds and walk x 1 min with </= 2/10 cervical pain and with proper posture    Baseline unmet    Time 4    Period Weeks    Status New    Target Date 12/11/19      PT LONG TERM GOAL #5   Title Pt will have improved foto score to </=28% limitation    Baseline 32%    Time 4    Period Weeks    Status New    Target Date 12/11/19                 Plan - 11/19/19 1040    Clinical Impression Statement Pt's report indicates improved neck and upper shoulder pain including tolerating work days with less pain. PT addressed posterior cahin strengthening of the neck and shoulder girdle as well as STW and manual axial traction to decrease muscular and cervical vertebral compression associated pain.    Personal Factors and Comorbidities Profession    Examination-Activity Limitations Lift    Examination-Participation Restrictions Cleaning;Occupation    Stability/Clinical Decision Making Stable/Uncomplicated    Clinical Decision Making Low    Rehab Potential Excellent    PT Frequency 2x / week    PT Duration 4 weeks    PT Treatment/Interventions Ultrasound;Moist Heat;Electrical Stimulation;Functional mobility training;Neuromuscular re-education;Therapeutic exercise;Therapeutic activities;Patient/family education;Manual techniques;Passive range of motion;Dry needling;Taping    PT Next Visit Plan teach and assess response to self cervical traction    PT Home Exercise Plan Access Code: [email or web address] scapular + neck retraction every hour, low trap setting, dynamic hug, row with green tband    Consulted and Agree with Plan of Care Patient           Patient will benefit from skilled therapeutic intervention in order to improve the following deficits and impairments:  Pain, Decreased activity tolerance, Decreased strength, Increased muscle spasms, Postural dysfunction, Hypomobility, Impaired flexibility  Visit Diagnosis: Abnormal  posture  Cervicalgia  Muscle spasm of back     Problem List Patient Active Problem List   Diagnosis Date Noted  . S/P TAH (total abdominal hysterectomy) 03/10/2015  . Vasovagal syncope 07/07/2014  . Essential hypertension 07/07/2014  . Hyperlipidemia  07/07/2014    Gar Ponto MS, PT 11/19/19 10:49 AM  [facility] [address] [place], [name], [address] Phone: [phone]   Fax:  [phone]  Name: Heather Rios MRN: [id number] Date of Birth: [birth year]

## 2019-11-24 ENCOUNTER — Ambulatory Visit: Payer: 59 | Admitting: Physical Therapy

## 2019-11-26 ENCOUNTER — Other Ambulatory Visit: Payer: Self-pay

## 2019-11-26 ENCOUNTER — Ambulatory Visit: Payer: 59 | Admitting: Physical Therapy

## 2019-11-26 DIAGNOSIS — R293 Abnormal posture: Secondary | ICD-10-CM | POA: Diagnosis not present

## 2019-11-26 DIAGNOSIS — M62830 Muscle spasm of back: Secondary | ICD-10-CM

## 2019-11-26 DIAGNOSIS — M542 Cervicalgia: Secondary | ICD-10-CM | POA: Diagnosis not present

## 2019-11-26 DIAGNOSIS — M545 Low back pain, unspecified: Secondary | ICD-10-CM

## 2019-11-26 MED FILL — IRBESARTAN 300 MG TAB: 300 | 30 days supply | Qty: 30 | Fill #0

## 2019-11-26 NOTE — Therapy (Signed)
[place], [name], [address] Phone: [phone]   Fax:  [phone]  Physical Therapy Treatment  Patient Details  Name: Heather Rios MRN: [id number] Date of Birth: 05-Feb-1966 Referring Provider (PT): Tiney Rouge, [place]   Encounter Date: 11/26/2019   PT End of Session - 11/26/19 1613    Visit Number 5    Number of Visits 10    Date for PT Re-Evaluation 12/11/19    Authorization Type UMR    PT Start Time 1535    PT Stop Time 5456    PT Time Calculation (min) 40 min    Activity Tolerance Patient tolerated treatment well;No increased pain    Behavior During Therapy WFL for tasks assessed/performed           Past Medical History:  Diagnosis Date  . Allergic rhinitis   . Anemia   . Anginal pain ([place])    Chest Pains 8/16  . Eczema   . Environmental allergies   . Essential hypertension 07/07/2014  . GERD (gastroesophageal reflux disease)   . Heart murmur   . High cholesterol   . Hyperlipidemia 07/07/2014  . Hypertension   . Hypoglycemia   . Hypoglycemia   . Plantar fasciitis   . S/P TAH (total abdominal hysterectomy) 03/10/2015  . Sinusitis, chronic   . Vasovagal syncope 07/07/2014    Past Surgical History:  Procedure Laterality Date  . ABDOMINAL HYSTERECTOMY N/A 03/10/2015   Procedure: HYSTERECTOMY ABDOMINAL;  Surgeon: Thurnell Lose, MD;  Location: [facility];  Service: Gynecology;  Laterality: N/A;  . BILATERAL SALPINGECTOMY Bilateral 03/10/2015   pt denies  . CHOLECYSTECTOMY    . FOOT SURGERY      There were no vitals filed for this visit.   Subjective Assessment - 11/26/19 1611    Subjective Pt reports increased pain and soreness from being alone for 6 out of her 8 hour shift at work.    Pertinent History prior history of LBP treated by PT    Limitations Lifting    Patient Stated Goals not to have to take any medicine    Currently in Pain? No/denies    Pain Descriptors /  Indicators Sore    Pain Onset More than a month ago                             [facility] Adult PT Treatment/Exercise - 11/26/19 0001      Neck Exercises: Standing   Other Standing Exercises Low trap setting against wall with yellow tband 2x10    Other Standing Exercises horizontal abduction yellow tband 2x10      Neck Exercises: Seated   Neck Retraction 10 reps    Neck Retraction Limitations yellow tband      Neck Exercises: Supine   Neck Retraction 10 reps    Other Supine Exercise cervical extension with retraction x 10    Other Supine Exercise Cervical retraction + scapular retraction x 10      Manual Therapy   Manual Therapy Soft tissue mobilization;Manual Traction    Soft tissue mobilization STW to the upper traps c tigger point release; R is more tight and tender than L, occipital release    Manual Traction Axial cervical traction       Neck Exercises: Stretches   Other Neck Stretches scalene stretch 2x30 sec    Other Neck Stretches MWM rotation x 30 sec  PT Long Term Goals - 10/26/19 1604      PT LONG TERM GOAL #1   Title She will be independnet with all HEP issued for pain management    Baseline issued at eval    Period Weeks    Status New    Target Date 12/11/19      PT LONG TERM GOAL #2   Title Pt will report pain decr 75% or more with work activities    Baseline n/a    Time 4    Period Weeks    Status New    Target Date 12/11/19      PT LONG TERM GOAL #3   Title Pt will be able to lift 30 pounds and walk x 1 min with </= 2/10 cervical pain and with proper posture    Baseline unmet    Time 4    Period Weeks    Status New    Target Date 12/11/19      PT LONG TERM GOAL #5   Title Pt will have improved foto score to </=28% limitation    Baseline 32%    Time 4    Period Weeks    Status New    Target Date 12/11/19                 Plan - 11/26/19 1615    Clinical Impression Statement Pt with  improved soreness after PT session. Treatment focused on manual therapy and stretching to address pt's increased R neck and shoulder tightness. Continued to progress posterior chain strengthen of neck & shoulder.    Personal Factors and Comorbidities Profession    Examination-Activity Limitations Lift    Examination-Participation Restrictions Cleaning;Occupation    Stability/Clinical Decision Making Stable/Uncomplicated    Rehab Potential Excellent    PT Frequency 2x / week    PT Duration 4 weeks    PT Treatment/Interventions Ultrasound;Moist Heat;Electrical Stimulation;Functional mobility training;Neuromuscular re-education;Therapeutic exercise;Therapeutic activities;Patient/family education;Manual techniques;Passive range of motion;Dry needling;Taping    PT Next Visit Plan teach and assess response to self cervical traction    PT Home Exercise Plan Access Code: [email or web address] scapular + neck retraction every hour, low trap setting with yellow tband, dynamic hug, row with green tband,    Consulted and Agree with Plan of Care Patient           Patient will benefit from skilled therapeutic intervention in order to improve the following deficits and impairments:  Pain, Decreased activity tolerance, Decreased strength, Increased muscle spasms, Postural dysfunction, Hypomobility, Impaired flexibility  Visit Diagnosis: Abnormal posture  Cervicalgia  Muscle spasm of back  Right-sided low back pain without sciatica, unspecified chronicity     Problem List Patient Active Problem List   Diagnosis Date Noted  . S/P TAH (total abdominal hysterectomy) 03/10/2015  . Vasovagal syncope 07/07/2014  . Essential hypertension 07/07/2014  . Hyperlipidemia 07/07/2014    [facility] [address] PT, DPT 11/26/2019, 5:07 PM  [facility] [address] [place], [name], [address] Phone: [phone]   Fax:  [phone]  Name: LORRIN BODNER MRN:  [id number] Date of Birth: December 25, 1965

## 2019-12-02 ENCOUNTER — Other Ambulatory Visit (HOSPITAL_COMMUNITY): Payer: Self-pay | Admitting: Family Medicine

## 2019-12-02 DIAGNOSIS — Z23 Encounter for immunization: Secondary | ICD-10-CM | POA: Diagnosis not present

## 2019-12-02 DIAGNOSIS — J309 Allergic rhinitis, unspecified: Secondary | ICD-10-CM | POA: Diagnosis not present

## 2019-12-02 DIAGNOSIS — L309 Dermatitis, unspecified: Secondary | ICD-10-CM | POA: Diagnosis not present

## 2019-12-02 DIAGNOSIS — M545 Low back pain: Secondary | ICD-10-CM | POA: Diagnosis not present

## 2019-12-02 DIAGNOSIS — R634 Abnormal weight loss: Secondary | ICD-10-CM | POA: Diagnosis not present

## 2019-12-02 DIAGNOSIS — E7800 Pure hypercholesterolemia, unspecified: Secondary | ICD-10-CM | POA: Diagnosis not present

## 2019-12-02 DIAGNOSIS — Z0000 Encounter for general adult medical examination without abnormal findings: Secondary | ICD-10-CM | POA: Diagnosis not present

## 2019-12-02 DIAGNOSIS — B002 Herpesviral gingivostomatitis and pharyngotonsillitis: Secondary | ICD-10-CM | POA: Diagnosis not present

## 2019-12-02 DIAGNOSIS — I10 Essential (primary) hypertension: Secondary | ICD-10-CM | POA: Diagnosis not present

## 2019-12-02 DIAGNOSIS — K219 Gastro-esophageal reflux disease without esophagitis: Secondary | ICD-10-CM | POA: Diagnosis not present

## 2019-12-03 ENCOUNTER — Ambulatory Visit: Payer: 59 | Admitting: Physical Therapy

## 2019-12-03 ENCOUNTER — Other Ambulatory Visit: Payer: Self-pay

## 2019-12-03 ENCOUNTER — Encounter: Payer: Self-pay | Admitting: Physical Therapy

## 2019-12-03 DIAGNOSIS — R293 Abnormal posture: Secondary | ICD-10-CM | POA: Diagnosis not present

## 2019-12-03 DIAGNOSIS — M62830 Muscle spasm of back: Secondary | ICD-10-CM

## 2019-12-03 DIAGNOSIS — M542 Cervicalgia: Secondary | ICD-10-CM

## 2019-12-03 DIAGNOSIS — M545 Low back pain, unspecified: Secondary | ICD-10-CM

## 2019-12-03 NOTE — Therapy (Signed)
[place], [name], [address] Phone: [phone]   Fax:  [phone]  Physical Therapy Treatment  Patient Details  Name: Heather Rios MRN: [id number] Date of Birth: July 08, 1965 Referring Provider (PT): Tiney Rouge, [place]   Encounter Date: 12/03/2019   PT End of Session - 12/03/19 1455    Visit Number 6    Number of Visits 10    Date for PT Re-Evaluation 12/11/19    Authorization Type UMR    PT Start Time 8416    PT Stop Time 6063    PT Time Calculation (min) 45 min    Activity Tolerance Patient tolerated treatment well;No increased pain    Behavior During Therapy WFL for tasks assessed/performed           Past Medical History:  Diagnosis Date  . Allergic rhinitis   . Anemia   . Anginal pain ([place])    Chest Pains 8/16  . Eczema   . Environmental allergies   . Essential hypertension 07/07/2014  . GERD (gastroesophageal reflux disease)   . Heart murmur   . High cholesterol   . Hyperlipidemia 07/07/2014  . Hypertension   . Hypoglycemia   . Hypoglycemia   . Plantar fasciitis   . S/P TAH (total abdominal hysterectomy) 03/10/2015  . Sinusitis, chronic   . Vasovagal syncope 07/07/2014    Past Surgical History:  Procedure Laterality Date  . ABDOMINAL HYSTERECTOMY N/A 03/10/2015   Procedure: HYSTERECTOMY ABDOMINAL;  Surgeon: Thurnell Lose, MD;  Location: [facility];  Service: Gynecology;  Laterality: N/A;  . BILATERAL SALPINGECTOMY Bilateral 03/10/2015   pt denies  . CHOLECYSTECTOMY    . FOOT SURGERY      There were no vitals filed for this visit.   Subjective Assessment - 12/03/19 1451    Subjective Pt reports work situation has not improved and that her shifts have been long and wearing away at her body. Pt states she called off work today because her pain and soreness after work radiated down her right arm. Pt states that therapy has been helping; however, her muscles get so overused at  work. Pt states she's not hurting now and has been able to rest her muscles.    Pertinent History prior history of LBP treated by PT    Limitations Lifting    Patient Stated Goals not to have to take any medicine    Currently in Pain? No/denies    Pain Onset More than a month ago                             [facility] Adult PT Treatment/Exercise - 12/03/19 0001      Neck Exercises: Supine   Other Supine Exercise Wall angel x10      Manual Therapy   Manual therapy comments TPDN performed by Consuello Bossier    Soft tissue mobilization STW to the upper traps & levators c tigger point release; R is more tight and tender than L      Neck Exercises: Stretches   Upper Trapezius Stretch Right;Left;2 reps;20 seconds    Levator Stretch Right;Left;2 reps;20 seconds    Other Neck Stretches scalene stretch 2x30 sec            Trigger Point Dry Needling - 12/03/19 0001    Consent Given? Yes    Education Handout Provided Previously provided    Muscles Treated Head and Neck Upper trapezius;Levator scapulae  Dry Needling Comments Provided by Consuello Bossier                PT Education - 12/03/19 1539    Education Details Discussed stretching regimen for after work. Pt does not have enough time during breaks for stretching or self massage. Discussed with pt POC -- pt states she would like a few more visits.    Person(s) Educated Patient    Methods Explanation;Demonstration;Verbal cues;Tactile cues    Comprehension Verbalized understanding;Returned demonstration;Verbal cues required;Tactile cues required               PT Long Term Goals - 12/03/19 1532      PT LONG TERM GOAL #1   Title She will be independnet with all HEP issued for pain management    Baseline issued at eval    Period Weeks    Status Partially Met      PT LONG TERM GOAL #2   Title Pt will report pain decr 75% or more with work activities    Baseline Reports 70% on 12/03/19    Time 4    Period Weeks     Status On-going      PT LONG TERM GOAL #3   Title Pt will be able to lift 30 pounds and walk x 1 min with </= 2/10 cervical pain and with proper posture    Baseline unmet    Time 4    Period Weeks    Status On-going      PT LONG TERM GOAL #5   Title Pt will have improved foto score to </=28% limitation    Baseline 32%    Time 4    Period Weeks    Status New                 Plan - 12/03/19 1519    Clinical Impression Statement Pt with increased muscle tightness and soreness after her last work shift. TPDN performed on pt due to increased muscle knots in R upper trap and levator. Manual therapy and stretching provided. Continued scapular strengthening. Pt tolerated treatment well. Pt's 6th visit FOTO score has worsened but this could be due to pt having an off day -- pt states that she has been doing good until her last work shift with improved pain by 70%.    Personal Factors and Comorbidities Profession    Examination-Activity Limitations Lift    Examination-Participation Restrictions Cleaning;Occupation    Stability/Clinical Decision Making Stable/Uncomplicated    Rehab Potential Excellent    PT Frequency 2x / week    PT Duration 4 weeks    PT Treatment/Interventions Ultrasound;Moist Heat;Electrical Stimulation;Functional mobility training;Neuromuscular re-education;Therapeutic exercise;Therapeutic activities;Patient/family education;Manual techniques;Passive range of motion;Dry needling;Taping    PT Next Visit Plan Perform re-eval/re-cert (pt would like a few more visits of PT). Continue neck and midback strengthening. Contineu manual therapy and stretching as indicated.    PT Home Exercise Plan Access Code: [email or web address] scapular + neck retraction every hour, low trap setting with yellow tband, dynamic hug, row with green tband,    Consulted and Agree with Plan of Care Patient           Patient will benefit from skilled therapeutic intervention in order to improve the  following deficits and impairments:  Pain, Decreased activity tolerance, Decreased strength, Increased muscle spasms, Postural dysfunction, Hypomobility, Impaired flexibility  Visit Diagnosis: Abnormal posture  Cervicalgia  Muscle spasm of back  Right-sided low back pain without sciatica, unspecified chronicity  Problem List Patient Active Problem List   Diagnosis Date Noted  . S/P TAH (total abdominal hysterectomy) 03/10/2015  . Vasovagal syncope 07/07/2014  . Essential hypertension 07/07/2014  . Hyperlipidemia 07/07/2014    [facility] April Ma L Karalynn Cottone PT, DPT 12/03/2019, 3:44 PM  [facility] [address] [place], [name], [address] Phone: [phone]   Fax:  [phone]  Name: Heather Rios MRN: [id number] Date of Birth: 07/04/1965

## 2019-12-04 ENCOUNTER — Telehealth: Payer: Self-pay | Admitting: Orthopaedic Surgery

## 2019-12-04 NOTE — Telephone Encounter (Signed)
yes

## 2019-12-04 NOTE — Telephone Encounter (Signed)
Informed patient about FMLA.    Dr Erlinda Hong, patient would like to have more PT. Okay to extend PT? She states she attend [facility] on [address]

## 2019-12-04 NOTE — Telephone Encounter (Signed)
Pt called stating she would like to file for FMLA due to the type of work she does causing her neck to have flare ups and would like to know if she needs an appt or if she can get just request the forms for her job and start filing? Pt also states she would like to extend her PT for 2 more sessions possibly since it does really help.  [phone]

## 2019-12-07 NOTE — Telephone Encounter (Signed)
PT order is ready for pick  up. Called patient no answer.  LMOM.

## 2019-12-08 ENCOUNTER — Telehealth: Payer: Self-pay | Admitting: Orthopaedic Surgery

## 2019-12-08 NOTE — Telephone Encounter (Signed)
[employer] forms received. Sent to [employer]. 

## 2019-12-12 DIAGNOSIS — Z20822 Contact with and (suspected) exposure to covid-19: Secondary | ICD-10-CM | POA: Diagnosis not present

## 2019-12-12 DIAGNOSIS — Z03818 Encounter for observation for suspected exposure to other biological agents ruled out: Secondary | ICD-10-CM | POA: Diagnosis not present

## 2019-12-12 MED FILL — DEXILANT DR 60 MG CAPSULE: 60 | 90 days supply | Qty: 90 | Fill #1

## 2019-12-28 ENCOUNTER — Ambulatory Visit: Payer: 59

## 2019-12-28 MED FILL — IRBESARTAN 300 MG TABS: 300 | 90 days supply | Qty: 90 | Fill #0

## 2020-01-04 MED FILL — PRAVASTATIN NA 40 MG TAB: 40 | 90 days supply | Qty: 90 | Fill #0

## 2020-01-11 MED FILL — valACYclovir HCL 1 GM TABS: 1 | 3 days supply | Qty: 12 | Fill #0

## 2020-01-26 ENCOUNTER — Other Ambulatory Visit (HOSPITAL_COMMUNITY): Payer: Self-pay | Admitting: Physician Assistant

## 2020-01-26 DIAGNOSIS — J0100 Acute maxillary sinusitis, unspecified: Secondary | ICD-10-CM | POA: Diagnosis not present

## 2020-01-26 MED FILL — CEFUROXIME AXETIL 500 MG TA: 500 | 10 days supply | Qty: 20 | Fill #0

## 2020-02-01 ENCOUNTER — Other Ambulatory Visit (HOSPITAL_COMMUNITY): Payer: Self-pay | Admitting: Family Medicine

## 2020-02-01 MED FILL — AZELASTINE HCL 0.05 % SOLN: 0.05 | 30 days supply | Qty: 6 | Fill #0

## 2020-02-02 DIAGNOSIS — Z03818 Encounter for observation for suspected exposure to other biological agents ruled out: Secondary | ICD-10-CM | POA: Diagnosis not present

## 2020-02-02 DIAGNOSIS — Z20822 Contact with and (suspected) exposure to covid-19: Secondary | ICD-10-CM | POA: Diagnosis not present

## 2020-02-19 IMAGING — MG MM DIGITAL SCREENING BILAT W/ TOMO W/ CAD
8 series · 8 of 24 positions shown · non-contrast
Comparison: Previous exam(s).

CLINICAL DATA: Screening.

EXAM:
DIGITAL SCREENING BILATERAL MAMMOGRAM WITH TOMO AND CAD

[L MLO synth-2D]
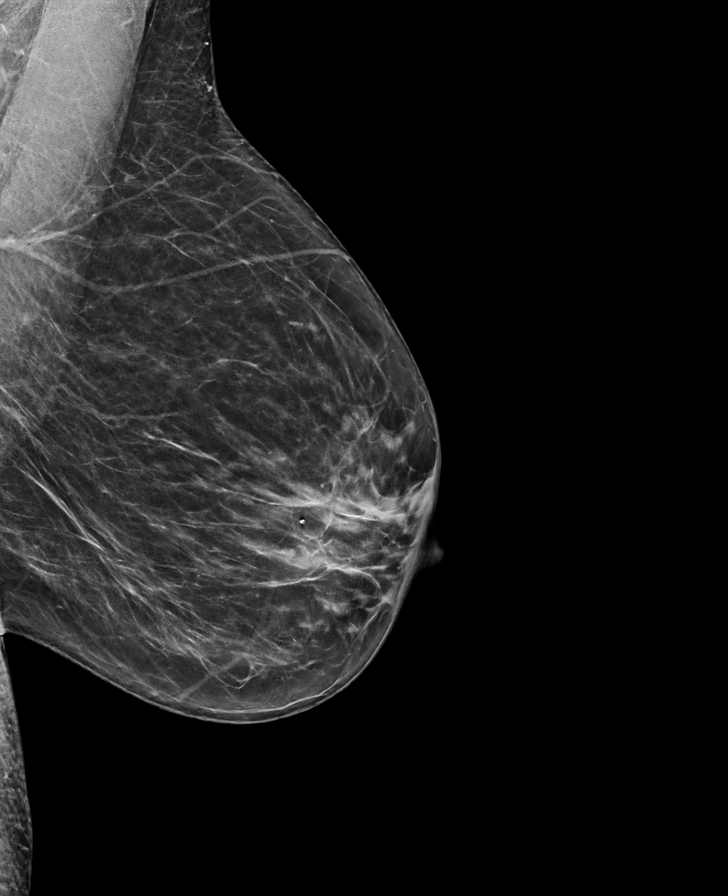

[R MLO synth-2D]
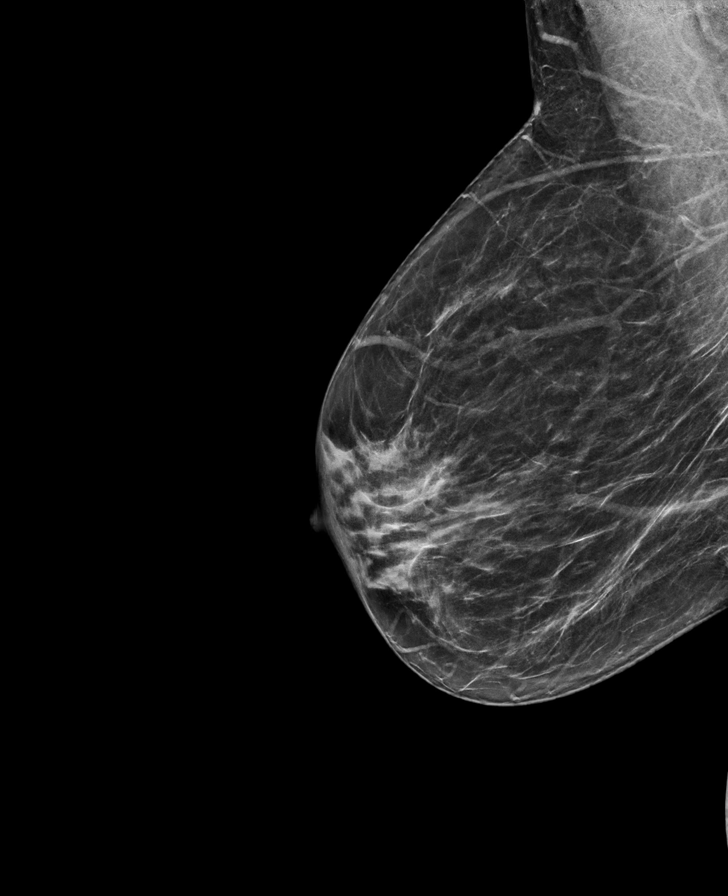

[L CC synth-2D]
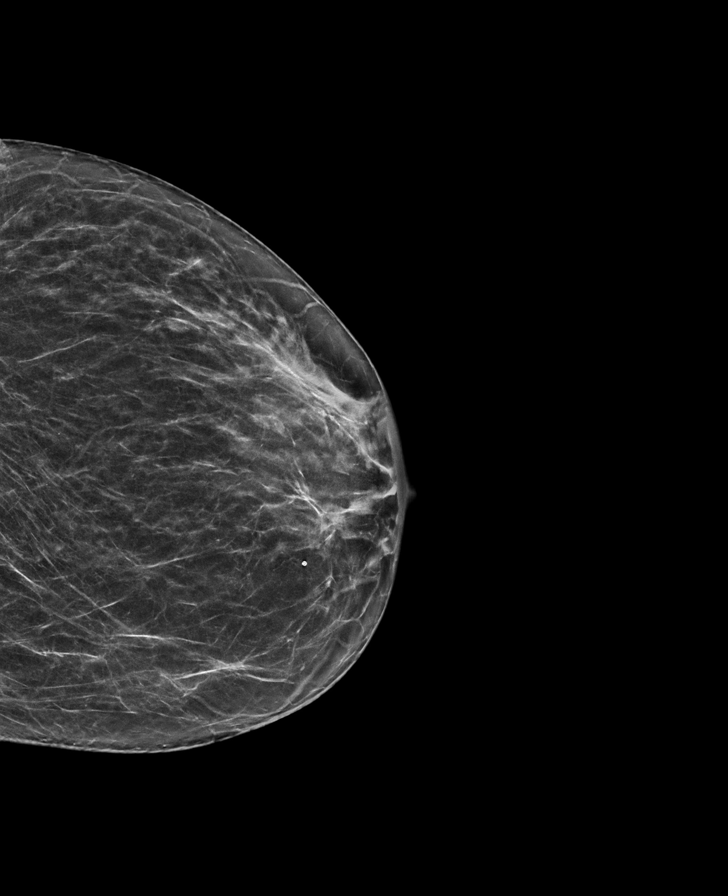

[R CC synth-2D]
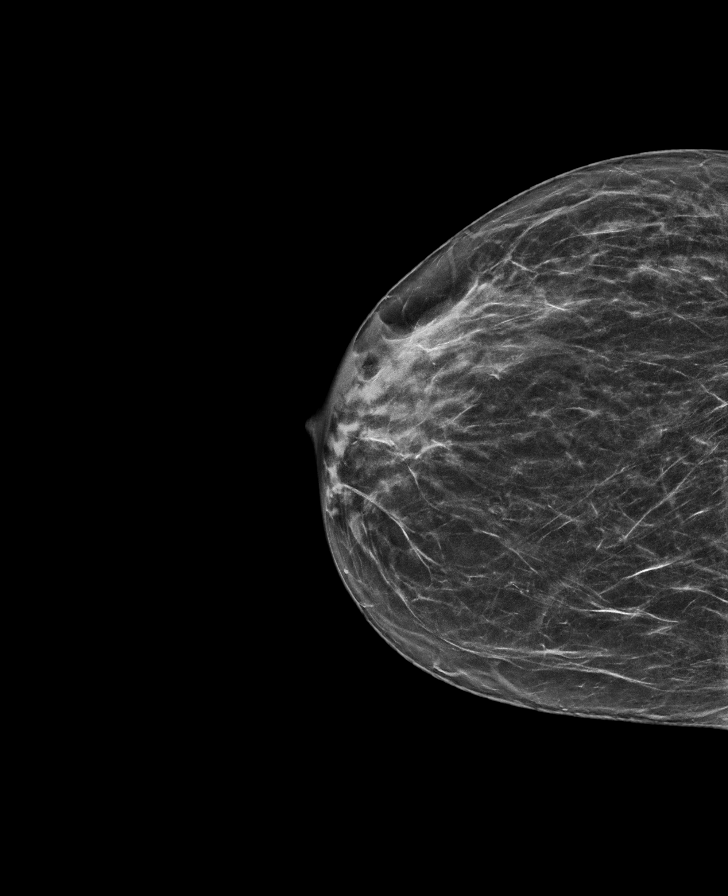

[R MLO tomo · tomo slice 34/67.0]
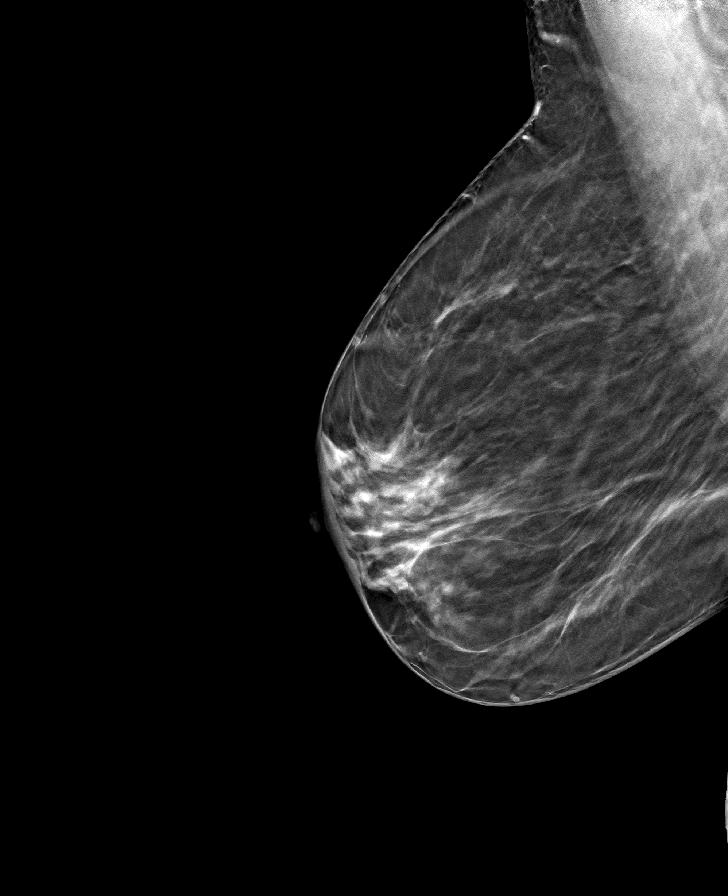

[L MLO tomo · tomo slice 35/69.0]
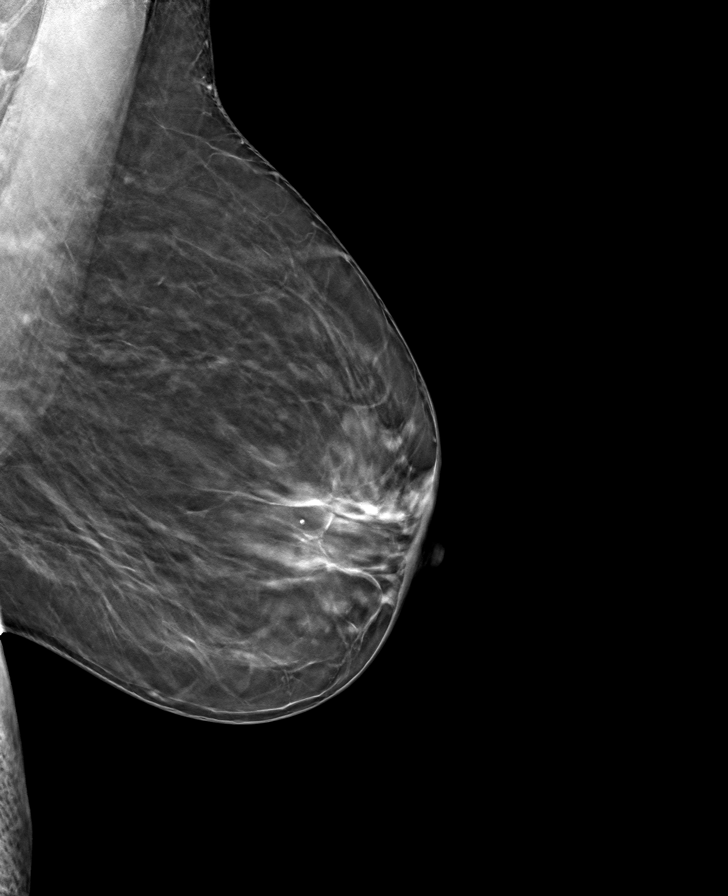

[L CC tomo · tomo slice 31/62.0]
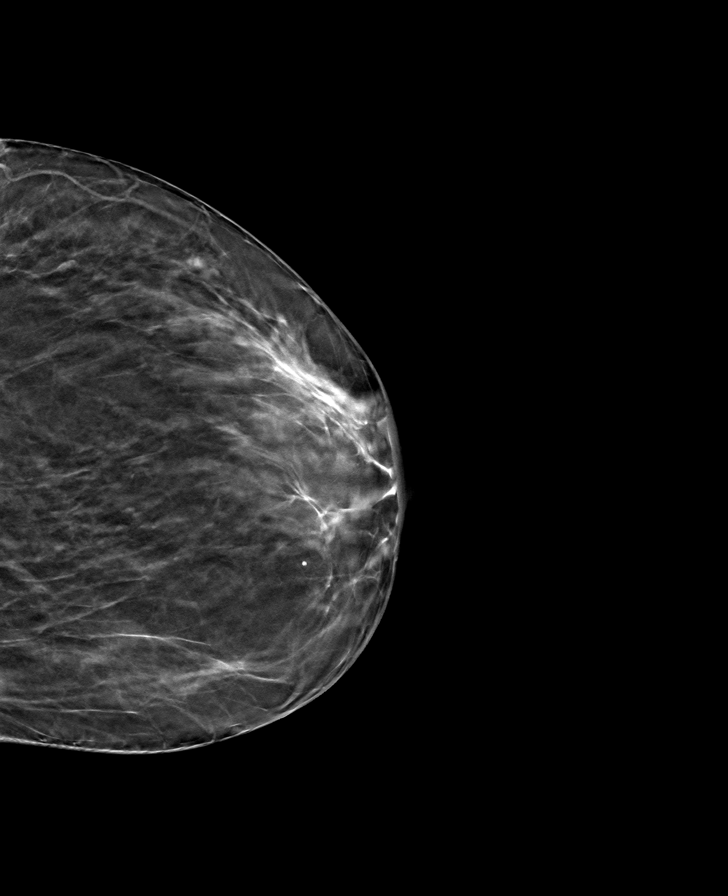

[R CC tomo · tomo slice 31/62.0]
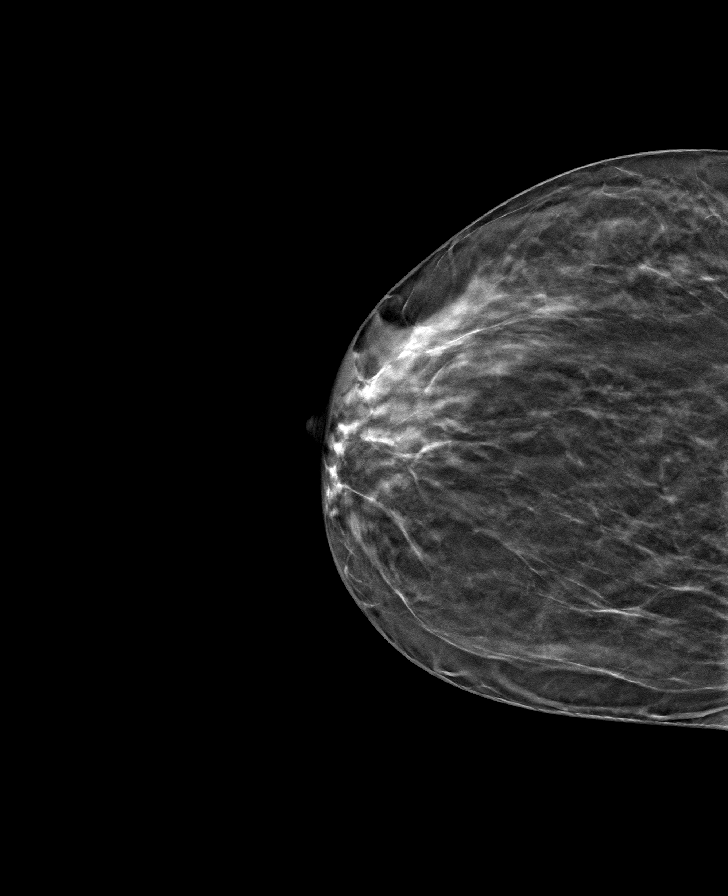

[8 of 24 positions shown; findings below may reference images not displayed]

ACR Breast Density Category c: The breast tissue is heterogeneously
dense, which may obscure small masses.
FINDINGS: In the left breast, a possible asymmetry warrants further
evaluation. This possible asymmetry is seen within the slightly
inner LEFT breast, at anterior depth, CC view only, tomosynthesis
slice 12.

In the right breast, no findings suspicious for malignancy. Images
were processed with CAD.
IMPRESSION: Further evaluation is suggested for possible asymmetry in the left
breast.

RECOMMENDATION:
Diagnostic mammogram and possibly ultrasound of the left breast.
(Code:[id number])

The patient will be contacted regarding the findings, and additional
imaging will be scheduled.

BI-RADS CATEGORY  0: Incomplete. Need additional imaging evaluation
and/or prior mammograms for comparison.

## 2020-02-23 DIAGNOSIS — H52223 Regular astigmatism, bilateral: Secondary | ICD-10-CM | POA: Diagnosis not present

## 2020-02-23 DIAGNOSIS — H524 Presbyopia: Secondary | ICD-10-CM | POA: Diagnosis not present

## 2020-02-23 DIAGNOSIS — H5203 Hypermetropia, bilateral: Secondary | ICD-10-CM | POA: Diagnosis not present

## 2020-02-25 ENCOUNTER — Other Ambulatory Visit: Payer: Self-pay

## 2020-02-25 ENCOUNTER — Encounter: Payer: Self-pay | Admitting: Physical Therapy

## 2020-02-25 ENCOUNTER — Ambulatory Visit: Payer: 59 | Attending: Physician Assistant | Admitting: Physical Therapy

## 2020-02-25 DIAGNOSIS — M542 Cervicalgia: Secondary | ICD-10-CM | POA: Diagnosis present

## 2020-02-25 DIAGNOSIS — M62830 Muscle spasm of back: Secondary | ICD-10-CM | POA: Diagnosis present

## 2020-02-25 DIAGNOSIS — R293 Abnormal posture: Secondary | ICD-10-CM | POA: Diagnosis present

## 2020-02-25 DIAGNOSIS — M545 Low back pain, unspecified: Secondary | ICD-10-CM | POA: Insufficient documentation

## 2020-02-26 NOTE — Therapy (Addendum)
[place], [name], [address] Phone: [phone]   Fax:  [phone]  Physical Therapy Treatment  Patient Details  Name: Heather Rios MRN: [id number] Date of Birth: [birth year] Referring Provider (PT): Tiney Rouge, [place]   Encounter Date: 02/25/2020   PT End of Session - 02/25/20 0939    Visit Number 7    Number of Visits 13    Date for PT Re-Evaluation 04/08/20    Authorization Type UMR    PT Start Time 0930    PT Stop Time 1012    PT Time Calculation (min) 42 min    Activity Tolerance Patient tolerated treatment well;No increased pain    Behavior During Therapy WFL for tasks assessed/performed           Past Medical History:  Diagnosis Date  . Allergic rhinitis   . Anemia   . Anginal pain ([place])    Chest Pains 8/16  . Eczema   . Environmental allergies   . Essential hypertension 07/07/2014  . GERD (gastroesophageal reflux disease)   . Heart murmur   . High cholesterol   . Hyperlipidemia 07/07/2014  . Hypertension   . Hypoglycemia   . Hypoglycemia   . Plantar fasciitis   . S/P TAH (total abdominal hysterectomy) 03/10/2015  . Sinusitis, chronic   . Vasovagal syncope 07/07/2014    Past Surgical History:  Procedure Laterality Date  . ABDOMINAL HYSTERECTOMY N/A 03/10/2015   Procedure: HYSTERECTOMY ABDOMINAL;  Surgeon: Thurnell Lose, MD;  Location: [facility];  Service: Gynecology;  Laterality: N/A;  . BILATERAL SALPINGECTOMY Bilateral 03/10/2015   pt denies  . CHOLECYSTECTOMY    . FOOT SURGERY      There were no vitals filed for this visit.   Subjective Assessment - 02/25/20 0936    Subjective Patient has been having pain for the past few days. She was lifting a tray and had a lot of pain. She feles like it has been stiffening. She did really well after the lat visit in September with dry needling. She feels like it loosened significantly.    Pertinent History prior history of LBP  treated by PT    Limitations Lifting    How long can you sit comfortably? indeterminate amount of time    How long can you stand comfortably? 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 Yes    Pain Score 8     Pain Location Neck    Pain Orientation Right    Pain Descriptors / Indicators Aching    Pain Type Chronic pain    Pain Radiating Towards radiating into the shoulder    Pain Onset More than a month ago    Pain Frequency Intermittent    Aggravating Factors  lifting and work duties    Pain Relieving Factors rest, volatren, HEP    Effect of Pain on Daily Activities Pain at work    Multiple Pain Sites No              [facility] PT Assessment - 02/26/20 0001      Assessment   Medical Diagnosis cervicalgia with R UE radiculopathy    Referring Provider (PT) Tiney Rouge, PA-C      AROM   Cervical Flexion 24 pain    Cervical  Extension 18 pain    Cervical - Right Rotation 55 with pain    Cervical - Left Rotation 40 with less pain      Strength   Overall Strength Comments Bil UE strength 4+/5      Palpation   Palpation comment R Upper Trap/Rhomboid tightness; R Lat tightness, Decreased scapular mobility as indicated above with UE abdct/flexion                         [facility] Adult PT Treatment/Exercise - 02/26/20 0001      Shoulder Exercises: Seated   Other Seated Exercises seated scap retraction 2x10      Manual Therapy   Manual therapy comments TPDN performed by Consuello Bossier    Soft tissue mobilization STW to the upper traps & levators c tigger point release; R is more tight and tender than L    Manual Traction gentle cervical traction      Neck Exercises: Stretches   Upper Trapezius Stretch Right;Left;2 reps;20 seconds    Levator Stretch Right;Left;2 reps;20 seconds                   PT Education - 02/25/20 0939    Education Details reviewed HEp and stretching at home    Person(s) Educated Patient    Methods Explanation;Demonstration;Tactile cues;Handout;Verbal cues    Comprehension Verbalized understanding;Returned demonstration;Tactile cues required;Verbal cues required               PT Long Term Goals - 02/26/20 0811      PT LONG TERM GOAL #1   Title She will be independnet with all HEP issued for pain management    Baseline used it after her onset of pain but still having pain    Time 4    Period Weeks    Status On-going      PT LONG TERM GOAL #2   Title Pt will report pain decr 75% or more with work activities    Baseline had been doing well until recent exacerbation    Time 4    Period Weeks    Status On-going      PT LONG TERM GOAL #3   Title Pt will be able to lift 30 pounds and walk x 1 min with </= 2/10 cervical pain and with proper posture    Baseline ambualte    Time 4    Period Weeks    Status On-going      PT LONG TERM GOAL #4   Title Patient will perfrom work tasks without increased pain    Time 4    Period Weeks    Status On-going      PT LONG TERM GOAL #5   Title Pt will have improved foto score to </=28% limitation    Baseline not assessed    Time 4    Period Weeks    Status On-going                 Plan - 02/26/20 0802    Clinical Impression Statement Patient returns following an actue exacerbatiuon of her neck symptoms. She was lifting a tray at work and felt her neck begin to tightne. She had a great response to needling  on her last session in [place]. She has a large spasm in her right upper trap. She has lost some motion with rotation compared to her last visit. She has minor pain with use of her right shoulder.  She had a great twitch respose to needling of her upper trap and manual therapy. Therapy reviewed the satretches to go over now and during futre exacerbations. She will be put back  on schedule 1W6 to make sure she gets through this current exacerbation.    Personal Factors and Comorbidities Profession    Examination-Activity Limitations Lift    Examination-Participation Restrictions Cleaning;Occupation    Stability/Clinical Decision Making Stable/Uncomplicated    Rehab Potential Excellent    PT Frequency 1x / week    PT Duration 6 weeks    PT Treatment/Interventions Ultrasound;Moist Heat;Electrical Stimulation;Functional mobility training;Neuromuscular re-education;Therapeutic exercise;Therapeutic activities;Patient/family education;Manual techniques;Passive range of motion;Dry needling;Taping    PT Next Visit Plan Perform re-eval/re-cert (pt would like a few more visits of PT). Continue neck and midback strengthening. Contineu manual therapy and stretching as indicated.    PT Home Exercise Plan Access Code: [email or web address] scapular + neck retraction every hour, low trap setting with yellow tband, dynamic hug, row with green tband,    Consulted and Agree with Plan of Care Patient           Patient will benefit from skilled therapeutic intervention in order to improve the following deficits and impairments:  Pain,Decreased activity tolerance,Decreased strength,Increased muscle spasms,Postural dysfunction,Hypomobility,Impaired flexibility  Visit Diagnosis: Abnormal posture  Cervicalgia  Muscle spasm of back  Right-sided low back pain without sciatica, unspecified chronicity     Problem List Patient Active Problem List   Diagnosis Date Noted  . S/P TAH (total abdominal hysterectomy) 03/10/2015  . Vasovagal syncope 07/07/2014  . Essential hypertension 07/07/2014  . Hyperlipidemia 07/07/2014    Carney Living PT DPT  02/26/2020, 9:08 AM  [facility] [address] [place], [name], [address] Phone: [phone]   Fax:  [phone]  Name: Heather Rios MRN: [id number] Date of Birth: 10-06-1965

## 2020-02-26 NOTE — Addendum Note (Signed)
Addended by: Carney Living on: 02/26/2020 09:09 AM   Modules accepted: Orders

## 2020-03-04 DIAGNOSIS — Z23 Encounter for immunization: Secondary | ICD-10-CM | POA: Diagnosis not present

## 2020-03-07 MED FILL — valACYclovir HCL 1 GM TABS: 1 | 3 days supply | Qty: 12 | Fill #0

## 2020-03-10 ENCOUNTER — Ambulatory Visit: Payer: 59 | Admitting: Physical Therapy

## 2020-03-14 MED FILL — DEXILANT DR 60 MG CAPSULE: 60 | 90 days supply | Qty: 90 | Fill #0

## 2020-03-16 DIAGNOSIS — K219 Gastro-esophageal reflux disease without esophagitis: Secondary | ICD-10-CM | POA: Diagnosis not present

## 2020-03-16 DIAGNOSIS — R140 Abdominal distension (gaseous): Secondary | ICD-10-CM | POA: Diagnosis not present

## 2020-03-21 ENCOUNTER — Ambulatory Visit: Payer: 59 | Attending: Physician Assistant | Admitting: Physical Therapy

## 2020-03-21 ENCOUNTER — Encounter: Payer: Self-pay | Admitting: Physical Therapy

## 2020-03-21 ENCOUNTER — Other Ambulatory Visit: Payer: Self-pay

## 2020-03-21 DIAGNOSIS — M545 Low back pain, unspecified: Secondary | ICD-10-CM | POA: Diagnosis present

## 2020-03-21 DIAGNOSIS — M542 Cervicalgia: Secondary | ICD-10-CM | POA: Insufficient documentation

## 2020-03-21 DIAGNOSIS — M62830 Muscle spasm of back: Secondary | ICD-10-CM | POA: Insufficient documentation

## 2020-03-21 DIAGNOSIS — R293 Abnormal posture: Secondary | ICD-10-CM | POA: Insufficient documentation

## 2020-03-21 NOTE — Therapy (Addendum)
[facility] [address] [place], [place], [address] Phone: [phone]   Fax:  [phone]  Physical Therapy Treatment  Patient Details  Name: DEBBRAH SAMPEDRO MRN: [id number] Date of Birth: 1965/12/13 Referring Provider (PT): Trenda Moots, [place]   Encounter Date: 03/21/2020   PT End of Session - 03/21/20 1611    Visit Number 8    Number of Visits 13    Date for PT Re-Evaluation 04/08/20    PT Start Time 1545    PT Stop Time 1625    PT Time Calculation (min) 40 min    Activity Tolerance Patient tolerated treatment well;No increased pain    Behavior During Therapy WFL for tasks assessed/performed           Past Medical History:  Diagnosis Date  . Allergic rhinitis   . Anemia   . Anginal pain ([facility])    Chest Pains 8/16  . Eczema   . Environmental allergies   . Essential hypertension 07/07/2014  . GERD (gastroesophageal reflux disease)   . Heart murmur   . High cholesterol   . Hyperlipidemia 07/07/2014  . Hypertension   . Hypoglycemia   . Hypoglycemia   . Plantar fasciitis   . S/P TAH (total abdominal hysterectomy) 03/10/2015  . Sinusitis, chronic   . Vasovagal syncope 07/07/2014    Past Surgical History:  Procedure Laterality Date  . ABDOMINAL HYSTERECTOMY N/A 03/10/2015   Procedure: HYSTERECTOMY ABDOMINAL;  Surgeon: Geryl Rankins, MD;  Location: [facility];  Service: Gynecology;  Laterality: N/A;  . BILATERAL SALPINGECTOMY Bilateral 03/10/2015   pt denies  . CHOLECYSTECTOMY    . FOOT SURGERY      There were no vitals filed for this visit.   Subjective Assessment - 03/21/20 1609    Subjective Patient reports she has good days and bad days. She isn't hurting too much today. She is much beeter then the last time she was seen.    Pertinent History prior history of LBP treated by PT    Limitations Lifting    How long can you sit comfortably? indeterminate amount of time    How long can you stand comfortably?  indeterminate amount of time    How long can you walk comfortably? 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 No/denies                             [facility] Adult PT Treatment/Exercise - 03/21/20 0001      Neck Exercises: Standing   Other Standing Exercises scap retraction 2x10 red; shoulder extension 2x10 red      Neck Exercises: Seated   Other Seated Exercise bilateral ER red 2x10; horizontal babduction 2x10; flexion with abduction 2x10      Manual Therapy   Manual therapy comments TPDN performed by Anderson Malta    Soft tissue mobilization STW to the upper traps & levators c tigger point release; R is more tight and tender than L    Manual Traction gentle cervical traction      Neck Exercises: Stretches   Upper Trapezius Stretch Right;Left;2 reps;20 seconds    Levator Stretch Right;Left;2 reps;20 seconds            Trigger Point Dry Needling - 03/21/20  0001    Consent Given? Yes    Muscles Treated Head and Neck Upper trapezius;Levator scapulae    Upper Trapezius Response Twitch reponse elicited;Palpable increased muscle length                PT Education - 03/21/20 1610    Education Details reviewed exercise tehcnique for home    Person(s) Educated Patient    Methods Explanation;Demonstration;Tactile cues;Verbal cues    Comprehension Verbalized understanding;Returned demonstration;Verbal cues required;Tactile cues required               PT Long Term Goals - 02/26/20 0811      PT LONG TERM GOAL #1   Title She will be independnet with all HEP issued for pain management    Baseline used it after her onset of pain but still having pain    Time 4    Period Weeks    Status On-going      PT LONG TERM GOAL #2   Title Pt will report pain decr 75% or more with work  activities    Baseline had been doing well until recent exacerbation    Time 4    Period Weeks    Status On-going      PT LONG TERM GOAL #3   Title Pt will be able to lift 30 pounds and walk x 1 min with </= 2/10 cervical pain and with proper posture    Baseline ambualte    Time 4    Period Weeks    Status On-going      PT LONG TERM GOAL #4   Title Patient will perfrom work tasks without increased pain    Time 4    Period Weeks    Status On-going      PT LONG TERM GOAL #5   Title Pt will have improved foto score to </=28% limitation    Baseline not assessed    Time 4    Period Weeks    Status On-going                 Plan - 03/21/20 1611    Clinical Impression Statement Patient had a significant decrease in spasm with dry needling.  She was given exercises for strenghtening at home. She had no increase in pain. She was given exercises to help strengthen when her arms are out. Therapy perfromed manual therapy to upper trap and manual traction.    Personal Factors and Comorbidities Profession    Examination-Activity Limitations Lift    Examination-Participation Restrictions Cleaning;Occupation    Stability/Clinical Decision Making Stable/Uncomplicated    Clinical Decision Making Low    Rehab Potential Excellent    PT Frequency 1x / week    PT Duration 6 weeks    PT Treatment/Interventions Ultrasound;Moist Heat;Electrical Stimulation;Functional mobility training;Neuromuscular re-education;Therapeutic exercise;Therapeutic activities;Patient/family education;Manual techniques;Passive range of motion;Dry needling;Taping    PT Home Exercise Plan Access Code: [email or web address] scapular + neck retraction every hour, low trap setting with yellow tband, dynamic hug, row with green tband,    Consulted and Agree with Plan of Care Patient           Patient will benefit from skilled therapeutic intervention in order to improve the following deficits and impairments:  Pain,Decreased  activity tolerance,Decreased strength,Increased muscle spasms,Postural dysfunction,Hypomobility,Impaired flexibility  Visit Diagnosis: Abnormal posture  Cervicalgia  Muscle spasm of back  Right-sided low back pain without sciatica, unspecified chronicity     Problem List Patient Active Problem List   Diagnosis  Date Noted  . S/P TAH (total abdominal hysterectomy) 03/10/2015  . Vasovagal syncope 07/07/2014  . Essential hypertension 07/07/2014  . Hyperlipidemia 07/07/2014    Dessie Coma PT DPT  03/21/2020, 4:34 PM  [facility] [address] [place], [place], [address] Phone: [phone]   Fax:  [phone]  Name: KAMBER VIGNOLA MRN: [id number] Date of Birth: Apr 04, 1965

## 2020-03-28 ENCOUNTER — Other Ambulatory Visit: Payer: Self-pay

## 2020-03-28 ENCOUNTER — Ambulatory Visit: Payer: 59 | Admitting: Physical Therapy

## 2020-03-28 DIAGNOSIS — R293 Abnormal posture: Secondary | ICD-10-CM | POA: Diagnosis not present

## 2020-03-28 DIAGNOSIS — M542 Cervicalgia: Secondary | ICD-10-CM | POA: Diagnosis not present

## 2020-03-28 DIAGNOSIS — M545 Low back pain, unspecified: Secondary | ICD-10-CM | POA: Diagnosis not present

## 2020-03-28 DIAGNOSIS — M62830 Muscle spasm of back: Secondary | ICD-10-CM | POA: Diagnosis not present

## 2020-03-28 MED FILL — IRBESARTAN 300 MG TABS: 300 | 90 days supply | Qty: 90 | Fill #1

## 2020-03-29 ENCOUNTER — Encounter: Payer: Self-pay | Admitting: Physical Therapy

## 2020-03-29 NOTE — Therapy (Signed)
[place], [name], [address] Phone: [phone]   Fax:  [phone]  Physical Therapy Treatment  Patient Details  Name: Heather Rios MRN: [id number] Date of Birth: [birth year] Referring Provider (PT): Tiney Rouge, [place]   Encounter Date: 03/28/2020   PT End of Session - 03/29/20 0948    Visit Number 9    Number of Visits 13    Date for PT Re-Evaluation 04/08/20    Authorization Type UMR    Authorization - Visit Number 9    PT Start Time 0345    PT Stop Time 0426    PT Time Calculation (min) 41 min    Activity Tolerance Patient tolerated treatment well;No increased pain    Behavior During Therapy WFL for tasks assessed/performed           Past Medical History:  Diagnosis Date  . Allergic rhinitis   . Anemia   . Anginal pain ([place])    Chest Pains 8/16  . Eczema   . Environmental allergies   . Essential hypertension 07/07/2014  . GERD (gastroesophageal reflux disease)   . Heart murmur   . High cholesterol   . Hyperlipidemia 07/07/2014  . Hypertension   . Hypoglycemia   . Hypoglycemia   . Plantar fasciitis   . S/P TAH (total abdominal hysterectomy) 03/10/2015  . Sinusitis, chronic   . Vasovagal syncope 07/07/2014    Past Surgical History:  Procedure Laterality Date  . ABDOMINAL HYSTERECTOMY N/A 03/10/2015   Procedure: HYSTERECTOMY ABDOMINAL;  Surgeon: Thurnell Lose, MD;  Location: [facility];  Service: Gynecology;  Laterality: N/A;  . BILATERAL SALPINGECTOMY Bilateral 03/10/2015   pt denies  . CHOLECYSTECTOMY    . FOOT SURGERY      There were no vitals filed for this visit.   Subjective Assessment - 03/29/20 0941    Subjective Patient reports she has no thad much pain at all. She would like to hold PT for about a month then vcome back in for a follow up. She has been using her stretches and exercises and has been nmore aware of her posture.    Pertinent History prior history of LBP  treated by PT    Limitations Lifting    How long can you sit comfortably? indeterminate amount of time    How long can you stand comfortably? indeterminate amount of time    How long can you walk comfortably? 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 No/denies                             [facility] Adult PT Treatment/Exercise - 03/29/20 0001      Neck Exercises: Standing   Other Standing Exercises scap retraction 2x10 green; shoulder extension 2x10 green    Other Standing Exercises flexion and Y's 2lb 2x10      Neck Exercises: Seated   Other Seated Exercise bilateral ER red 2x10; horizontal abduction 2x10; flexion with abduction 2x10      Manual Therapy   Manual therapy comments assessed trigger points therapy determinedneedling not needed    Soft tissue mobilization STW to the upper traps & levators c tigger point release; R is more tight and tender than L    Manual  Traction gentle cervical traction; sub-occipital release      Neck Exercises: Stretches   Upper Trapezius Stretch Right;Left;2 reps;20 seconds    Levator Stretch Right;Left;2 reps;20 seconds           Self Care: reviewed how to use stretches vs exercises and progression of activity. Reviewed lifting posutre.        PT Education - 03/29/20 0941    Education Details reviewed program for her to continue over the next few months    Person(s) Educated Patient;Spouse    Methods Explanation;Demonstration    Comprehension Verbalized understanding;Returned demonstration;Verbal cues required;Tactile cues required               PT Long Term Goals - 02/26/20 0811      PT LONG TERM GOAL #1   Title She will be independnet with all HEP issued for pain management    Baseline used it after her onset of pain but still  having pain    Time 4    Period Weeks    Status On-going      PT LONG TERM GOAL #2   Title Pt will report pain decr 75% or more with work activities    Baseline had been doing well until recent exacerbation    Time 4    Period Weeks    Status On-going      PT LONG TERM GOAL #3   Title Pt will be able to lift 30 pounds and walk x 1 min with </= 2/10 cervical pain and with proper posture    Baseline ambualte    Time 4    Period Weeks    Status On-going      PT LONG TERM GOAL #4   Title Patient will perfrom work tasks without increased pain    Time 4    Period Weeks    Status On-going      PT LONG TERM GOAL #5   Title Pt will have improved foto score to </=28% limitation    Baseline not assessed    Time 4    Period Weeks    Status On-going                 Plan - 03/29/20 0948    Clinical Impression Statement Patient is doing very well. She had minimal tirgger points today in her neck. Therapy reviewed complete progrram for home. SDhe has been more aware of her lifting technique at work. She was given a home program to [facility] on for the next month. She will come back in a month for a re-assessment with potential D/C to HEP.    Personal Factors and Comorbidities Profession    Examination-Activity Limitations Lift    Examination-Participation Restrictions Cleaning;Occupation    Stability/Clinical Decision Making Stable/Uncomplicated    Clinical Decision Making Low    Rehab Potential Excellent    PT Frequency 1x / week    PT Duration 6 weeks    PT Treatment/Interventions Ultrasound;Moist Heat;Electrical Stimulation;Functional mobility training;Neuromuscular re-education;Therapeutic exercise;Therapeutic activities;Patient/family education;Manual techniques;Passive range of motion;Dry needling;Taping    PT Next Visit Plan Perform re-eval/re-cert (pt would like a few more visits of PT). Continue neck and midback strengthening. Contineu manual therapy and stretching as  indicated.    PT Home Exercise Plan Access Code: [email or web address] scapular + neck retraction every hour, low trap setting with yellow tband, dynamic hug, row with green tband,    Consulted and Agree with Plan of Care Patient  Patient will benefit from skilled therapeutic intervention in order to improve the following deficits and impairments:  Pain,Decreased activity tolerance,Decreased strength,Increased muscle spasms,Postural dysfunction,Hypomobility,Impaired flexibility  Visit Diagnosis: Abnormal posture  Cervicalgia  Muscle spasm of back  Right-sided low back pain without sciatica, unspecified chronicity     Problem List Patient Active Problem List   Diagnosis Date Noted  . S/P TAH (total abdominal hysterectomy) 03/10/2015  . Vasovagal syncope 07/07/2014  . Essential hypertension 07/07/2014  . Hyperlipidemia 07/07/2014    Carney Living PT DPT  03/29/2020, 9:59 AM  [facility] [address] [place], [name], [address] Phone: [phone]   Fax:  [phone]  Name: CHRISTLE NOLTING MRN: [id number] Date of Birth: 17-Jun-1965

## 2020-03-30 ENCOUNTER — Ambulatory Visit (INDEPENDENT_AMBULATORY_CARE_PROVIDER_SITE_OTHER): Payer: 59

## 2020-03-30 ENCOUNTER — Other Ambulatory Visit: Payer: Self-pay

## 2020-03-30 ENCOUNTER — Ambulatory Visit: Payer: 59 | Admitting: Podiatry

## 2020-03-30 DIAGNOSIS — M778 Other enthesopathies, not elsewhere classified: Secondary | ICD-10-CM | POA: Diagnosis not present

## 2020-03-30 DIAGNOSIS — R600 Localized edema: Secondary | ICD-10-CM | POA: Diagnosis not present

## 2020-03-30 DIAGNOSIS — L989 Disorder of the skin and subcutaneous tissue, unspecified: Secondary | ICD-10-CM

## 2020-03-30 NOTE — Progress Notes (Signed)
   Subjective: 55 y.o. female presenting to the office today for evaluation of a symptomatic skin lesion to the plantar aspect of the right lateral foot.  Patient states is very painful to walk.  It was trimmed out previously and she got significant relief for several months.  She presents for further treatment and evaluation   Past Medical History:  Diagnosis Date  . Allergic rhinitis   . Anemia   . Anginal pain ([place])    Chest Pains 8/16  . Eczema   . Environmental allergies   . Essential hypertension 07/07/2014  . GERD (gastroesophageal reflux disease)   . Heart murmur   . High cholesterol   . Hyperlipidemia 07/07/2014  . Hypertension   . Hypoglycemia   . Hypoglycemia   . Plantar fasciitis   . S/P TAH (total abdominal hysterectomy) 03/10/2015  . Sinusitis, chronic   . Vasovagal syncope 07/07/2014     Objective:  Physical Exam General: Alert and oriented x3 in no acute distress  Dermatology: Hyperkeratotic lesion(s) present on the plantar aspect of the left foot. Pain on palpation with a central nucleated core noted. Skin is warm, dry and supple bilateral lower extremities. Negative for open lesions or macerations.  Vascular: Palpable pedal pulses bilaterally. No edema or erythema noted. Capillary refill within normal limits.  Neurological: Epicritic and protective threshold grossly intact bilaterally.   Musculoskeletal Exam: Pain on palpation at the keratotic lesion(s) noted. Range of motion within normal limits bilateral. Muscle strength 5/5 in all groups bilateral.  Assessment: 1.  Porokeratosis left foot   Plan of Care:  1. Patient evaluated 2. Excisional debridement of keratoic lesion(s) using a chisel blade was performed without incident.  3. Salinocaine applied and provided. Dressed area with light dressing. 4. Patient is to return to the clinic PRN.   *Works Barrister's clerk at [employer]  Edrick Kins, DPM [facility]  Dr. Edrick Kins, [place] [address]                                        [place], [place] [address]                Office [phone]  Fax [phone]

## 2020-04-04 ENCOUNTER — Encounter: Payer: 59 | Admitting: Physical Therapy

## 2020-04-06 ENCOUNTER — Other Ambulatory Visit (HOSPITAL_COMMUNITY): Payer: Self-pay | Admitting: Physician Assistant

## 2020-04-06 MED FILL — PRAVASTATIN NA 40 MG TAB: 40 | 90 days supply | Qty: 90 | Fill #1

## 2020-04-06 MED FILL — AMLODIPINE BESYLATE 5 MG TA: 5 | 90 days supply | Qty: 90 | Fill #0

## 2020-04-11 ENCOUNTER — Encounter: Payer: 59 | Admitting: Physical Therapy

## 2020-04-14 ENCOUNTER — Other Ambulatory Visit: Payer: 59 | Admitting: Orthotics

## 2020-04-27 ENCOUNTER — Ambulatory Visit: Payer: 59 | Attending: Physician Assistant | Admitting: Physical Therapy

## 2020-04-27 ENCOUNTER — Other Ambulatory Visit: Payer: Self-pay

## 2020-04-27 DIAGNOSIS — M62830 Muscle spasm of back: Secondary | ICD-10-CM | POA: Insufficient documentation

## 2020-04-27 DIAGNOSIS — M542 Cervicalgia: Secondary | ICD-10-CM | POA: Insufficient documentation

## 2020-04-27 DIAGNOSIS — R293 Abnormal posture: Secondary | ICD-10-CM | POA: Diagnosis present

## 2020-04-27 DIAGNOSIS — M545 Low back pain, unspecified: Secondary | ICD-10-CM | POA: Diagnosis present

## 2020-04-28 ENCOUNTER — Encounter: Payer: Self-pay | Admitting: Physical Therapy

## 2020-04-28 NOTE — Therapy (Signed)
[place], [name], [address] Phone: [phone]   Fax:  [phone]  Physical Therapy Treatment/Discharge   Patient Details  Name: Heather Rios MRN: [id number] Date of Birth: Nov 21, 1965 Referring Provider (PT): Tiney Rouge, [place]   Encounter Date: 04/27/2020    PT End of Session - 04/27/20 1505    Visit Number 10    Number of Visits 13    Date for PT Re-Evaluation 04/27/20    Authorization Type UMR    PT Start Time 0300    PT Stop Time 0340    PT Time Calculation (min) 40 min    Activity Tolerance Patient tolerated treatment well;No increased pain    Behavior During Therapy WFL for tasks assessed/performed           Past Medical History:  Diagnosis Date  . Allergic rhinitis   . Anemia   . Anginal pain ([place])    Chest Pains 8/16  . Eczema   . Environmental allergies   . Essential hypertension 07/07/2014  . GERD (gastroesophageal reflux disease)   . Heart murmur   . High cholesterol   . Hyperlipidemia 07/07/2014  . Hypertension   . Hypoglycemia   . Hypoglycemia   . Plantar fasciitis   . S/P TAH (total abdominal hysterectomy) 03/10/2015  . Sinusitis, chronic   . Vasovagal syncope 07/07/2014    Past Surgical History:  Procedure Laterality Date  . ABDOMINAL HYSTERECTOMY N/A 03/10/2015   Procedure: HYSTERECTOMY ABDOMINAL;  Surgeon: Thurnell Lose, MD;  Location: [facility];  Service: Gynecology;  Laterality: N/A;  . BILATERAL SALPINGECTOMY Bilateral 03/10/2015   pt denies  . CHOLECYSTECTOMY    . FOOT SURGERY      There were no vitals filed for this visit.   Subjective Assessment - 04/28/20 1518    Subjective Patient reports she has had intemittent neck pain. She has been doing dsome stretching and things at work. The pain comes and goes.    Pertinent History prior history of LBP treated by PT    Limitations Lifting    How long can you sit comfortably? indeterminate amount of time     How long can you stand comfortably? indeterminate amount of time    How long can you walk comfortably? 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 No/denies    Pain Onset More than a month ago    Pain Frequency Constant    Aggravating Factors  lifting and work duties    Pain Relieving Factors rest and voltaren    Effect of Pain on Daily Activities pain at work              [facility] PT Assessment - 04/28/20 0001      Assessment   Medical Diagnosis cervicalgia with R UE radiculopathy    Referring Provider (PT) Tiney Rouge, PA-C      AROM   Cervical Flexion 40    Cervical Extension 26    Cervical - Right Rotation 70    Cervical - Left Rotation 50      Strength   Overall Strength Comments Bil UE strength 4+/5      Palpation   Palpation comment mild spasming of right upper trap  [facility] Adult PT Treatment/Exercise - 04/28/20 0001      Self-Care   Other Self-Care Comments  reviewed  how to use her plan going forward; reviewed her measurements and FOTO score. reviewed limitations in movement and how they may effect her sleeping position,      Shoulder Exercises: Seated   Other Seated Exercises bilateral er 2x10 ; horizontal abduction 2x10; shoulder flexion 2x10      Shoulder Exercises: Standing   Extension Strengthening;20 reps    Theraband Level (Shoulder Extension) Level 3 (Green)    Row Strengthening;Both;20 reps;Theraband    Theraband Level (Shoulder Row) Level 3 (Green)      Manual Therapy   Manual therapy comments palpation of trigger points      Neck Exercises: Stretches   Upper Trapezius Stretch Right;Left;2 reps;20 seconds    Levator Stretch Right;Left;2 reps;20 seconds    Chest Stretch 3 reps;20 seconds                  PT  Education - 04/28/20 1521    Education Details HEP and symptom mangement    Person(s) Educated Patient    Methods Explanation;Demonstration;Tactile cues;Verbal cues    Comprehension Verbalized understanding;Returned demonstration;Verbal cues required;Tactile cues required               PT Long Term Goals - 02/26/20 0811      PT LONG TERM GOAL #1   Title She will be independnet with all HEP issued for pain management    Baseline used it after her onset of pain but still having pain    Time 4    Period Weeks    Status On-going      PT LONG TERM GOAL #2   Title Pt will report pain decr 75% or more with work activities    Baseline had been doing well until recent exacerbation    Time 4    Period Weeks    Status On-going      PT LONG TERM GOAL #3   Title Pt will be able to lift 30 pounds and walk x 1 min with </= 2/10 cervical pain and with proper posture    Baseline ambualte    Time 4    Period Weeks    Status On-going      PT LONG TERM GOAL #4   Title Patient will perfrom work tasks without increased pain    Time 4    Period Weeks    Status On-going      PT LONG TERM GOAL #5   Title Pt will have improved foto score to </=28% limitation    Baseline not assessed    Time 4    Period Weeks    Status On-going                 Plan - 04/27/20 1525    Clinical Impression Statement At this point the patient has reached max benefit from therapy. She continues to have intermittent neck pain although it has improved significantly from thre first visit. her first visit was back las t [place]. Therayp talked with her about continueing to work on her stretches and exercises on her own. She had mild tightness in her upper trap today. She has respoeded well to needling in the past but the last 2 visits she has not had any significant trigger points to needle.The patients FOTO score went down from the intial eval although the patient reports improved function at work.  D/C  to  HEP at this time.    Personal Factors and Comorbidities Profession    Examination-Activity Limitations Lift    Examination-Participation Restrictions Cleaning;Occupation    Stability/Clinical Decision Making Stable/Uncomplicated    Clinical Decision Making Low    Rehab Potential Excellent    PT Frequency 1x / week    PT Duration 6 weeks    PT Treatment/Interventions Ultrasound;Moist Heat;Electrical Stimulation;Functional mobility training;Neuromuscular re-education;Therapeutic exercise;Therapeutic activities;Patient/family education;Manual techniques;Passive range of motion;Dry needling;Taping    PT Next Visit Plan Perform re-eval/re-cert (pt would like a few more visits of PT). Continue neck and midback strengthening. Contineu manual therapy and stretching as indicated.    PT Home Exercise Plan Access Code: [email or web address] scapular + neck retraction every hour, low trap setting with yellow tband, dynamic hug, row with green tband,    Consulted and Agree with Plan of Care Patient           Patient will benefit from skilled therapeutic intervention in order to improve the following deficits and impairments:  Pain,Decreased activity tolerance,Decreased strength,Increased muscle spasms,Postural dysfunction,Hypomobility,Impaired flexibility  Visit Diagnosis: Abnormal posture  Cervicalgia  Muscle spasm of back  Right-sided low back pain without sciatica, unspecified chronicity  PHYSICAL THERAPY DISCHARGE SUMMARY  Visits from Start of Care: 10  Current functional level related to goals / functional outcomes: Improved ability to use right arm and improved ability to work   Remaining deficits: Pain at times   Education / Equipment: HEP   Plan: Patient agrees to discharge.  Patient goals were met. Patient is being discharged due to lack of progress.  ?????       Problem List Patient Active Problem List   Diagnosis Date Noted  . S/P TAH (total abdominal hysterectomy) 03/10/2015  .  Vasovagal syncope 07/07/2014  . Essential hypertension 07/07/2014  . Hyperlipidemia 07/07/2014    Carney Living PT DPT  04/28/2020, 4:01 PM  [facility] [address] [place], [name], [address] Phone: [phone]   Fax:  [phone]  Name: Heather Rios MRN: [id number] Date of Birth: 01/03/1966

## 2020-04-28 NOTE — Addendum Note (Signed)
Addended by: Carney Living on: 04/28/2020 04:04 PM   Modules accepted: Orders

## 2020-04-29 ENCOUNTER — Other Ambulatory Visit (HOSPITAL_COMMUNITY): Payer: Self-pay | Admitting: Physician Assistant

## 2020-04-29 MED FILL — PANTOPRAZOLE SOD DR 40 MG T: 40 | 90 days supply | Qty: 90 | Fill #0

## 2020-05-04 ENCOUNTER — Ambulatory Visit: Payer: 59 | Admitting: Physical Therapy

## 2020-06-01 ENCOUNTER — Other Ambulatory Visit (HOSPITAL_COMMUNITY): Payer: Self-pay | Admitting: Family Medicine

## 2020-06-01 DIAGNOSIS — R109 Unspecified abdominal pain: Secondary | ICD-10-CM | POA: Diagnosis not present

## 2020-06-02 DIAGNOSIS — K219 Gastro-esophageal reflux disease without esophagitis: Secondary | ICD-10-CM | POA: Diagnosis not present

## 2020-06-02 DIAGNOSIS — R634 Abnormal weight loss: Secondary | ICD-10-CM | POA: Diagnosis not present

## 2020-06-02 DIAGNOSIS — I10 Essential (primary) hypertension: Secondary | ICD-10-CM | POA: Diagnosis not present

## 2020-06-02 DIAGNOSIS — L309 Dermatitis, unspecified: Secondary | ICD-10-CM | POA: Diagnosis not present

## 2020-06-02 DIAGNOSIS — E7800 Pure hypercholesterolemia, unspecified: Secondary | ICD-10-CM | POA: Diagnosis not present

## 2020-06-02 DIAGNOSIS — J309 Allergic rhinitis, unspecified: Secondary | ICD-10-CM | POA: Diagnosis not present

## 2020-06-02 DIAGNOSIS — B002 Herpesviral gingivostomatitis and pharyngotonsillitis: Secondary | ICD-10-CM | POA: Diagnosis not present

## 2020-06-02 DIAGNOSIS — M545 Low back pain, unspecified: Secondary | ICD-10-CM | POA: Diagnosis not present

## 2020-06-03 ENCOUNTER — Other Ambulatory Visit (HOSPITAL_COMMUNITY): Payer: Self-pay | Admitting: Family Medicine

## 2020-06-06 ENCOUNTER — Other Ambulatory Visit (HOSPITAL_COMMUNITY): Payer: Self-pay | Admitting: Family Medicine

## 2020-06-06 MED FILL — FLUCONAZOLE 150 MG TABS: 150 | 1 days supply | Qty: 1 | Fill #0

## 2020-06-14 MED FILL — DEXLANSOPRAZOLE 60 MG CPDR: 60 | 90 days supply | Qty: 90 | Fill #0

## 2020-06-21 ENCOUNTER — Other Ambulatory Visit (HOSPITAL_COMMUNITY): Payer: Self-pay

## 2020-06-22 ENCOUNTER — Other Ambulatory Visit (HOSPITAL_COMMUNITY): Payer: Self-pay

## 2020-06-22 MED ORDER — IRBESARTAN 300 MG PO TABS
300.0000 mg | ORAL_TABLET | Freq: Every morning | ORAL | 1 refills | Status: DC
  Filled 2020-06-22 (×2): qty 90, 90d supply, fill #0
  Filled 2020-09-19: qty 90, 90d supply, fill #1

## 2020-06-24 ENCOUNTER — Other Ambulatory Visit (HOSPITAL_COMMUNITY): Payer: Self-pay

## 2020-06-24 MED FILL — Amlodipine Besylate Tab 5 MG (Base Equivalent): ORAL | 90 days supply | Qty: 90 | Fill #0 | Status: AC

## 2020-06-24 MED FILL — Celecoxib Cap 200 MG: ORAL | 90 days supply | Qty: 90 | Fill #0 | Status: AC

## 2020-06-24 MED FILL — Pravastatin Sodium Tab 40 MG: ORAL | 90 days supply | Qty: 90 | Fill #0 | Status: AC

## 2020-07-12 ENCOUNTER — Other Ambulatory Visit: Payer: Self-pay | Admitting: Physician Assistant

## 2020-07-12 ENCOUNTER — Other Ambulatory Visit (HOSPITAL_COMMUNITY): Payer: Self-pay

## 2020-07-12 ENCOUNTER — Other Ambulatory Visit: Payer: Self-pay

## 2020-07-12 ENCOUNTER — Ambulatory Visit (INDEPENDENT_AMBULATORY_CARE_PROVIDER_SITE_OTHER): Payer: 59 | Admitting: Orthopaedic Surgery

## 2020-07-12 ENCOUNTER — Encounter: Payer: Self-pay | Admitting: Orthopaedic Surgery

## 2020-07-12 DIAGNOSIS — G5601 Carpal tunnel syndrome, right upper limb: Secondary | ICD-10-CM | POA: Diagnosis not present

## 2020-07-12 DIAGNOSIS — M5412 Radiculopathy, cervical region: Secondary | ICD-10-CM | POA: Diagnosis not present

## 2020-07-12 DIAGNOSIS — G5602 Carpal tunnel syndrome, left upper limb: Secondary | ICD-10-CM | POA: Diagnosis not present

## 2020-07-12 DIAGNOSIS — G5603 Carpal tunnel syndrome, bilateral upper limbs: Secondary | ICD-10-CM | POA: Diagnosis not present

## 2020-07-12 MED ORDER — LIDOCAINE HCL 1 % IJ SOLN
0.3000 mL | INTRAMUSCULAR | Status: AC | PRN
  Administered 2020-07-12: .3 mL

## 2020-07-12 MED ORDER — PREDNISONE 10 MG (21) PO TBPK
ORAL_TABLET | ORAL | 0 refills | Status: DC
  Filled 2020-07-12: qty 21, 6d supply, fill #0

## 2020-07-12 MED ORDER — BUPIVACAINE HCL 0.25 % IJ SOLN
0.3300 mL | INTRAMUSCULAR | Status: AC | PRN
  Administered 2020-07-12: .33 mL

## 2020-07-12 MED ORDER — METHYLPREDNISOLONE ACETATE 40 MG/ML IJ SUSP
13.3300 mg | INTRAMUSCULAR | Status: AC | PRN
  Administered 2020-07-12: 13.33 mg

## 2020-07-12 MED ORDER — METHOCARBAMOL 500 MG PO TABS
500.0000 mg | ORAL_TABLET | Freq: Two times a day (BID) | ORAL | 0 refills | Status: DC | PRN
  Filled 2020-07-12: qty 20, 10d supply, fill #0

## 2020-07-12 NOTE — Progress Notes (Signed)
Office Visit Note   Patient: Heather Rios           Date of Birth: [birth year]           MRN: [id number] Visit Date: 07/12/2020              Requested by: Maury Dus, MD [place],  [place] [address] PCP: Maury Dus, MD   Assessment & Plan: Visit Diagnoses:  1. Carpal tunnel syndrome, bilateral   2. Radiculopathy of cervical spine     Plan: Impression as cervical spine radiculopathy and bilateral carpal tunnel syndrome right greater than left.  In regards to her neck, we have discussed restarting her on a steroid and muscle relaxer as well as adding physical therapy referral.  Regards to the carpal tunnel syndrome.  She would like a right carpal tunnel injection today for which we will proceed.  She will follow-up with [place] as needed.  Follow-Up Instructions: Return if symptoms worsen or fail to improve.   Orders:  No orders of the defined types were placed in this encounter.  No orders of the defined types were placed in this encounter.     Procedures: Hand/UE Inj: R carpal tunnel for carpal tunnel syndrome on 07/12/2020 2:54 PM Indications: pain Details: 25 G needle, volar approach Medications: 0.3 mL lidocaine 1 %; 0.33 mL bupivacaine 0.25 %; 13.33 mg methylPREDNISolone acetate 40 MG/ML      Clinical Data: No additional findings.   Subjective: Chief Complaint  Patient presents with  . Right Hand - Numbness  . Left Hand - Pain  . Neck - Pain    HPI patient is a pleasant 55 year old right hand dominant female who comes in today with recurrent cervical spine and parascapular pain in addition to bilateral hand paresthesias.  She was seen by [place] for the same issues 2 to 3 years ago where she was started on a steroid, muscle relaxer and sent to physical therapy.  She is also had both carpal tunnels injected with cortisone.  All of the above significantly helped until recently.  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  The seem to worsen her symptoms.  She has also been taking Celebrex without relief.  Review of Systems as detailed in HPI.  All others reviewed and are negative.   Objective: Vital Signs: LMP 01/10/2015 (Exact Date)   Physical Exam well-developed well-nourished female in no acute distress.  Alert and oriented x3.  Ortho Exam   Cervical spine exam reveals no spinous tenderness.  She has slight tenderness to the left paraspinous musculature.  Moderate tenderness along both parascapular regions.  No pain with flexion, extension or rotation of the neck.  Bilateral hand exam reveals positive Phalen and Tinel on the right.  Negative on the left.  No thenar atrophy.  She is neurovascular intact distally.   Specialty Comments:  No specialty comments available.  Imaging: No new imaging   PMFS History: Patient Active Problem List   Diagnosis Date Noted  . S/P TAH (total abdominal hysterectomy) 03/10/2015  . Vasovagal syncope 07/07/2014  . Essential hypertension 07/07/2014  . Hyperlipidemia 07/07/2014   Past Medical History:  Diagnosis Date  . Allergic rhinitis   . Anemia   . Anginal pain ([place])    Chest Pains 8/16  . Eczema   . Environmental allergies   . Essential hypertension 07/07/2014  . GERD (gastroesophageal reflux disease)   .  Heart murmur   . High cholesterol   . Hyperlipidemia 07/07/2014  . Hypertension   . Hypoglycemia   . Hypoglycemia   . Plantar fasciitis   . S/P TAH (total abdominal hysterectomy) 03/10/2015  . Sinusitis, chronic   . Vasovagal syncope 07/07/2014    Family History  Problem Relation Age of Onset  . Heart failure Mother   . Hyperlipidemia Mother   . Heart attack Mother 4  . Hyperlipidemia Father   . Kidney failure Father        kidney transplant receipiant  . Diabetes Other   . High blood pressure Other   . Breast cancer Neg Hx   . Colon cancer Neg Hx   . Colon polyps Neg Hx   . Esophageal  cancer Neg Hx   . Rectal cancer Neg Hx   . Stomach cancer Neg Hx     Past Surgical History:  Procedure Laterality Date  . ABDOMINAL HYSTERECTOMY N/A 03/10/2015   Procedure: HYSTERECTOMY ABDOMINAL;  Surgeon: Thurnell Lose, MD;  Location: [facility];  Service: Gynecology;  Laterality: N/A;  . BILATERAL SALPINGECTOMY Bilateral 03/10/2015   pt denies  . CHOLECYSTECTOMY    . FOOT SURGERY     Social History   Occupational History  . Not on file  Tobacco Use  . Smoking status: Never Smoker  . Smokeless tobacco: Never Used  Substance and Sexual Activity  . Alcohol use: No  . Drug use: No  . Sexual activity: Yes    Birth control/protection: Implant

## 2020-07-27 ENCOUNTER — Other Ambulatory Visit (HOSPITAL_COMMUNITY): Payer: Self-pay

## 2020-07-27 DIAGNOSIS — J302 Other seasonal allergic rhinitis: Secondary | ICD-10-CM | POA: Diagnosis not present

## 2020-07-27 DIAGNOSIS — J019 Acute sinusitis, unspecified: Secondary | ICD-10-CM | POA: Diagnosis not present

## 2020-07-27 DIAGNOSIS — J309 Allergic rhinitis, unspecified: Secondary | ICD-10-CM | POA: Diagnosis not present

## 2020-07-27 DIAGNOSIS — K219 Gastro-esophageal reflux disease without esophagitis: Secondary | ICD-10-CM | POA: Diagnosis not present

## 2020-07-27 MED ORDER — CEFUROXIME AXETIL 500 MG PO TABS
ORAL_TABLET | ORAL | 0 refills | Status: DC
  Filled 2020-07-27: qty 14, 7d supply, fill #0

## 2020-07-27 MED ORDER — MONTELUKAST SODIUM 10 MG PO TABS
ORAL_TABLET | ORAL | 1 refills | Status: DC
  Filled 2020-07-27: qty 90, 90d supply, fill #0

## 2020-07-27 MED ORDER — FAMOTIDINE 40 MG PO TABS
ORAL_TABLET | ORAL | 1 refills | Status: DC
  Filled 2020-07-27: qty 30, 30d supply, fill #0
  Filled 2020-09-06: qty 30, 30d supply, fill #1

## 2020-08-08 DIAGNOSIS — Z01419 Encounter for gynecological examination (general) (routine) without abnormal findings: Secondary | ICD-10-CM | POA: Diagnosis not present

## 2020-08-22 ENCOUNTER — Other Ambulatory Visit: Payer: Self-pay

## 2020-08-22 ENCOUNTER — Ambulatory Visit: Payer: 59 | Attending: Physician Assistant | Admitting: Physical Therapy

## 2020-08-22 DIAGNOSIS — M542 Cervicalgia: Secondary | ICD-10-CM | POA: Diagnosis not present

## 2020-08-22 DIAGNOSIS — M25611 Stiffness of right shoulder, not elsewhere classified: Secondary | ICD-10-CM | POA: Diagnosis not present

## 2020-08-22 DIAGNOSIS — G8929 Other chronic pain: Secondary | ICD-10-CM | POA: Insufficient documentation

## 2020-08-22 DIAGNOSIS — M25511 Pain in right shoulder: Secondary | ICD-10-CM | POA: Diagnosis not present

## 2020-08-22 DIAGNOSIS — R293 Abnormal posture: Secondary | ICD-10-CM | POA: Diagnosis not present

## 2020-08-22 NOTE — Therapy (Signed)
[place], [name], [address] Phone: [phone]   Fax:  [phone]  Physical Therapy Evaluation  Patient Details  Name: Heather Rios MRN: [id number] Date of Birth: 1965/06/27 Referring Provider (PT): Heather Rios, [place]   Encounter Date: 08/22/2020   PT End of Session - 08/22/20 1709    Visit Number 1    Number of Visits 13    Date for PT Re-Evaluation 10/03/20    Authorization Type UMR - [place]    PT Start Time 7893    PT Stop Time 1630    PT Time Calculation (min) 45 min    Activity Tolerance Patient tolerated treatment well;No increased pain    Behavior During Therapy WFL for tasks assessed/performed           Past Medical History:  Diagnosis Date  . Allergic rhinitis   . Anemia   . Anginal pain ([place])    Chest Pains 8/16  . Eczema   . Environmental allergies   . Essential hypertension 07/07/2014  . GERD (gastroesophageal reflux disease)   . Heart murmur   . High cholesterol   . Hyperlipidemia 07/07/2014  . Hypertension   . Hypoglycemia   . Hypoglycemia   . Plantar fasciitis   . S/P TAH (total abdominal hysterectomy) 03/10/2015  . Sinusitis, chronic   . Vasovagal syncope 07/07/2014    Past Surgical History:  Procedure Laterality Date  . ABDOMINAL HYSTERECTOMY N/A 03/10/2015   Procedure: HYSTERECTOMY ABDOMINAL;  Surgeon: Heather Lose, MD;  Location: [facility];  Service: Gynecology;  Laterality: N/A;  . BILATERAL SALPINGECTOMY Bilateral 03/10/2015   pt denies  . CHOLECYSTECTOMY    . FOOT SURGERY      There were no vitals filed for this visit.    Subjective Assessment - 08/22/20 1550    Subjective Pt reports continued R arm issues with intermittent sharp pains. Pt was given coritsone shot in her hand for R UE numbness; was also given brace which has been helping. Pt states her MD feels that the shoulder issues comes from the neck. Pt reports TPDN helped with her neck on last  PT visits. Pt has been doing stretches and exercises for her neck. Pt is in the process of moving. Pt is still working in sterile processing.    Pertinent History prior history of LBP treated by PT    Limitations Lifting    How long can you sit comfortably? indeterminate amount of time    How long can you stand comfortably? indeterminate amount of time    How long can you walk comfortably? 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 No/denies    Pain Onset More than a month ago              [facility] PT Assessment - 08/22/20 0001      Assessment   Medical Diagnosis Posture -- bilat neck & upper shoulder pain    Referring Provider (PT) Heather Rouge, PA-C    Hand Dominance Right    Prior Therapy Last seen 04/27/20 for her neck      Precautions   Precautions None      Restrictions   Weight Bearing Restrictions No      Balance Screen   Has the patient fallen in  the past 6 months No      [place] residence    Additional Comments About to move      Prior Function   Level of Independence Independent    Vocation Full time employment    Vocation Requirements sterile processing at [facility]   Overall Cognitive Status Within Functional Limits for tasks assessed      Observation/Other Assessments   Focus on Therapeutic Outcomes (FOTO)  Will assess next session (no ipads available)      AROM   Right/Left Shoulder Right    Right Shoulder Extension 75 Degrees    Right Shoulder Flexion 118 Degrees    Right Shoulder ABduction 140 Degrees    Right Shoulder Internal Rotation --   To L1 with pain; L shoulder goes to between shoulder blades   Right Shoulder External Rotation --   To mid scapula above head with pain   Cervical Flexion WFL    Cervical Extension  [facility]    Cervical - Right Side Bend 35    Cervical - Left Side Bend 45    Cervical - Right Rotation 85    Cervical - Left Rotation 70      Strength   Overall Strength Comments R Grip: 20/35/33 lbs; L grip: 30/25/15 lbs    Right/Left Shoulder Right    Right Shoulder Flexion 3+/5   Pain with resistance; increased scapular protraction   Right Shoulder Extension 3+/5   Limited due to pain   Right Shoulder ABduction 5/5    Right Shoulder Internal Rotation 5/5    Right Shoulder External Rotation 5/5      Palpation   Spinal mobility Cervical spine WFL    Palpation comment TTP scalenes      Special Tests    Special Tests Thoracic Outlet Syndrome    Thoracic Outlet Syndrome  Costcoclavicular Syndrome Test;Arms overhead;Adson Test      Adson Test   Findings Positive      Costcoclavicular Syndrome Test   Comment Pain with full shoulder extension      Arms overhead    Findings Postive                      Objective measurements completed on examination: See above findings.                 PT Short Term Goals - 08/22/20 1718      PT SHORT TERM GOAL #1   Title PT will have pt perform FOTO in next 1-2 visits and create appropriate goal    Time 2    Period Weeks    Status New    Target Date 09/05/20             PT Long Term Goals - 08/22/20 1716      PT LONG TERM GOAL #1   Title She will be independnet with all HEP issued for pain management    Baseline Has been indep with prior HEP for her neck 08/22/20    Time 6    Period Weeks    Status Revised    Target Date 10/03/20      PT LONG TERM GOAL #2   Title Pt will report no pain with end range shoulder motions    Baseline Pain at end range IR, extension, and flexion with MMT    Time 6    Period Weeks  Status New    Target Date 10/03/20      PT LONG TERM GOAL #3   Title Pt will have improved bilat grip strength to at least 35# for improved UE function    Baseline --    Time 6    Period  Weeks    Status New    Target Date 10/03/20      PT LONG TERM GOAL #4   Title Patient will perfrom work tasks without increased pain    Time 6    Period Weeks    Status New    Target Date 10/03/20      PT LONG TERM GOAL #5   Baseline --    Time --    Period --    Status --                  Plan - 08/22/20 1711    Clinical Impression Statement Ms. Heather Rios is a 55 y/o F presenting back to PT with R shoulder/arm pain and hand weakness. Pt's neck ROM has been maintained since the pt's last therapy episode involving her neck with no increased upper trap trigger points. She has not had as much neck pain since her last bout of therapy and been mindful of her posture but states her MD believes her arm and shoulder issues may be coming from her neck. On assessment, pt shows s/s indicative of possible thoracic outlet syndrome via special testing and TTP scalenes/first rib. Pt with pain during MMT of flexion and end range IR and extension resulting in some decreased strength and ROM. Pt would benefit from PT to try and improve her shoulder/arm symptoms.    Personal Factors and Comorbidities Profession    Examination-Activity Limitations Lift    Examination-Participation Restrictions Cleaning;Occupation    Stability/Clinical Decision Making Stable/Uncomplicated    Rehab Potential Excellent    PT Frequency 1x / week    PT Duration 6 weeks    PT Treatment/Interventions Ultrasound;Moist Heat;Electrical Stimulation;Functional mobility training;Neuromuscular re-education;Therapeutic exercise;Therapeutic activities;Patient/family education;Manual techniques;Passive range of motion;Dry needling;Taping    PT Next Visit Plan Continue neck and midback/scapular strengthening. Contineu manual therapy and stretching as indicated.    PT Home Exercise Plan Access Code: [email or web address]; pt to work on scalene stretch, first rib mobilization and pec stretching to decompress nerves    Consulted and Agree with Plan  of Care Patient           Patient will benefit from skilled therapeutic intervention in order to improve the following deficits and impairments:  Pain,Decreased activity tolerance,Decreased strength,Increased muscle spasms,Postural dysfunction,Hypomobility,Impaired flexibility,Impaired UE functional use  Visit Diagnosis: Abnormal posture  Cervicalgia  Chronic right shoulder pain  Stiffness of right shoulder, not elsewhere classified     Problem List Patient Active Problem List   Diagnosis Date Noted  . S/P TAH (total abdominal hysterectomy) 03/10/2015  . Vasovagal syncope 07/07/2014  . Essential hypertension 07/07/2014  . Hyperlipidemia 07/07/2014    [facility] April Ma L Cadi Rhinehart PT, DPT 08/22/2020, 5:24 PM  [facility] [address] [place], [name], [address] Phone: [phone]   Fax:  [phone]  Name: Heather Rios MRN: [id number] Date of Birth: 05-09-1965

## 2020-09-06 ENCOUNTER — Other Ambulatory Visit (HOSPITAL_COMMUNITY): Payer: Self-pay | Admitting: Occupational Medicine

## 2020-09-06 ENCOUNTER — Ambulatory Visit (HOSPITAL_COMMUNITY)
Admission: RE | Admit: 2020-09-06 | Discharge: 2020-09-06 | Disposition: A | Discharge status: Home / Self Care | Payer: PRIVATE HEALTH INSURANCE | Source: Ambulatory Visit | Attending: Occupational Medicine | Admitting: Occupational Medicine

## 2020-09-06 ENCOUNTER — Other Ambulatory Visit: Payer: Self-pay

## 2020-09-06 DIAGNOSIS — M1712 Unilateral primary osteoarthritis, left knee: Secondary | ICD-10-CM | POA: Insufficient documentation

## 2020-09-06 DIAGNOSIS — S8992XA Unspecified injury of left lower leg, initial encounter: Secondary | ICD-10-CM | POA: Diagnosis not present

## 2020-09-06 DIAGNOSIS — W19XXXA Unspecified fall, initial encounter: Secondary | ICD-10-CM

## 2020-09-06 MED FILL — Dexlansoprazole Cap Delayed Release 60 MG: ORAL | 90 days supply | Qty: 90 | Fill #0 | Status: AC

## 2020-09-07 ENCOUNTER — Ambulatory Visit: Payer: 59

## 2020-09-07 DIAGNOSIS — M542 Cervicalgia: Secondary | ICD-10-CM | POA: Diagnosis not present

## 2020-09-07 DIAGNOSIS — G8929 Other chronic pain: Secondary | ICD-10-CM

## 2020-09-07 DIAGNOSIS — R293 Abnormal posture: Secondary | ICD-10-CM | POA: Diagnosis not present

## 2020-09-07 DIAGNOSIS — M25511 Pain in right shoulder: Secondary | ICD-10-CM | POA: Diagnosis not present

## 2020-09-07 DIAGNOSIS — M25611 Stiffness of right shoulder, not elsewhere classified: Secondary | ICD-10-CM

## 2020-09-07 NOTE — Therapy (Signed)
[place], [name], [address] Phone: [phone]   Fax:  [phone]  Physical Therapy Treatment  Patient Details  Name: Heather Rios MRN: [id number] Date of Birth: 04-19-1965 Referring Provider (PT): Tiney Rouge, [place]   Encounter Date: 09/07/2020   PT End of Session - 09/07/20 1721     Visit Number 2    Number of Visits 13    Date for PT Re-Evaluation 10/03/20    Authorization Type UMR -     Progress Note Due on Visit 10    PT Start Time 1506    PT Stop Time 1552    PT Time Calculation (min) 46 min    Activity Tolerance Patient tolerated treatment well;No increased pain    Behavior During Therapy [facility] for tasks assessed/performed             Past Medical History:  Diagnosis Date   Allergic rhinitis    Anemia    Anginal pain ([place])    Chest Pains 8/16   Eczema    Environmental allergies    Essential hypertension 07/07/2014   GERD (gastroesophageal reflux disease)    Heart murmur    High cholesterol    Hyperlipidemia 07/07/2014   Hypertension    Hypoglycemia    Hypoglycemia    Plantar fasciitis    S/P TAH (total abdominal hysterectomy) 03/10/2015   Sinusitis, chronic    Vasovagal syncope 07/07/2014    Past Surgical History:  Procedure Laterality Date   ABDOMINAL HYSTERECTOMY N/A 03/10/2015   Procedure: HYSTERECTOMY ABDOMINAL;  Surgeon: Thurnell Lose, MD;  Location: [facility];  Service: Gynecology;  Laterality: N/A;   BILATERAL SALPINGECTOMY Bilateral 03/10/2015   pt denies   CHOLECYSTECTOMY     FOOT SURGERY      There were no vitals filed for this visit.   Subjective Assessment - 09/07/20 1510     Subjective Pt states her R neck/shoulder are much better with less numbess in her R hand, but does have some numbness with her L hand. Pt notes she has not had neck/shoulder pain in approx. 1 week.    Patient Stated Goals not to have to take any medicine    Currently in  Pain? No/denies    Pain Score 0-No pain    Pain Location Neck   shoulder                              [facility] Adult PT Treatment/Exercise - 09/07/20 0001       Exercises   Exercises Neck;Shoulder      Shoulder Exercises: Seated   Retraction Both;10 reps   3 sec     Shoulder Exercises: Standing   Extension Strengthening;15 reps    Theraband Level (Shoulder Extension) Level 3 (Green)    Row Strengthening;Both;Theraband;15 reps    Theraband Level (Shoulder Row) Level 3 (Green)      Manual Therapy   Manual Therapy Soft tissue mobilization    Soft tissue mobilization STM to the bilat upper traps and scalenes c trigger point release      Neck Exercises: Stretches   Upper Trapezius Stretch Right;Left;2 reps;20 seconds   strap assist   Chest Stretch 3 reps;20 seconds   60d doorway   Other Neck Stretches Scalene R and L; 2 reps, 20 sec, strap assist    Other Neck Stretches Cervical retraction, 10x, 3 sce  PT Education - 09/07/20 1720     Education Details HEP updated    Person(s) Educated Patient    Methods Explanation;Demonstration;Tactile cues;Verbal cues;Handout    Comprehension Tactile cues required;Verbal cues required;Returned demonstration;Verbalized understanding              PT Short Term Goals - 08/22/20 1718       PT SHORT TERM GOAL #1   Title PT will have pt perform FOTO in next 1-2 visits and create appropriate goal    Time 2    Period Weeks    Status New    Target Date 09/05/20               PT Long Term Goals - 08/22/20 1716       PT LONG TERM GOAL #1   Title She will be independnet with all HEP issued for pain management    Baseline Has been indep with prior HEP for her neck 08/22/20    Time 6    Period Weeks    Status Revised    Target Date 10/03/20      PT LONG TERM GOAL #2   Title Pt will report no pain with end range shoulder motions    Baseline Pain at end range IR, extension, and  flexion with MMT    Time 6    Period Weeks    Status New    Target Date 10/03/20      PT LONG TERM GOAL #3   Title Pt will have improved bilat grip strength to at least 35# for improved UE function    Baseline --    Time 6    Period Weeks    Status New    Target Date 10/03/20      PT LONG TERM GOAL #4   Title Patient will perfrom work tasks without increased pain    Time 6    Period Weeks    Status New    Target Date 10/03/20      PT LONG TERM GOAL #5   Baseline --    Time --    Period --    Status --                   Plan - 09/07/20 1720     Clinical Impression Statement Per pt's subjective report she has experienced a good result from completing the cervical/shoulder stretching exs c shoulder/rib stabilization assist from a strap. Pt states she has not had neck/shoulder pain in approx 1 week. PT today continued stretching exs and STM c trigger point release of the bilat scalenes and upper traps. Additionally, strengthening exs for posterior shoulder/back muscle groups were completed. Pt is responding well to PT intervention.    Personal Factors and Comorbidities Profession    Examination-Activity Limitations Lift    Examination-Participation Restrictions Cleaning;Occupation    Stability/Clinical Decision Making Stable/Uncomplicated    Clinical Decision Making Low    Rehab Potential Excellent    PT Frequency 1x / week    PT Duration 6 weeks    PT Treatment/Interventions Ultrasound;Moist Heat;Electrical Stimulation;Functional mobility training;Neuromuscular re-education;Therapeutic exercise;Therapeutic activities;Patient/family education;Manual techniques;Passive range of motion;Dry needling;Taping    PT Next Visit Plan Continue neck and midback/scapular strengthening. Continue manual therapy and stretching as indicated.    PT Home Exercise Plan Access Code: [email or web address]; pt to work on scalene stretch, first rib mobilization and pec stretching to decompress nerves     Consulted and Agree with Plan of Care Patient  Patient will benefit from skilled therapeutic intervention in order to improve the following deficits and impairments:  Pain, Decreased activity tolerance, Decreased strength, Increased muscle spasms, Postural dysfunction, Hypomobility, Impaired flexibility, Impaired UE functional use  Visit Diagnosis: Abnormal posture  Cervicalgia  Chronic right shoulder pain  Stiffness of right shoulder, not elsewhere classified     Problem List Patient Active Problem List   Diagnosis Date Noted   S/P TAH (total abdominal hysterectomy) 03/10/2015   Vasovagal syncope 07/07/2014   Essential hypertension 07/07/2014   Hyperlipidemia 07/07/2014    Gar Ponto MS, PT 09/07/20 5:30 PM   [facility] [address] [place], [name], [address] Phone: [phone]   Fax:  [phone]  Name: Heather Rios MRN: [id number] Date of Birth: [birth year]

## 2020-09-09 ENCOUNTER — Other Ambulatory Visit (HOSPITAL_COMMUNITY): Payer: Self-pay

## 2020-09-12 ENCOUNTER — Other Ambulatory Visit (HOSPITAL_COMMUNITY): Payer: Self-pay

## 2020-09-12 MED FILL — Pravastatin Sodium Tab 40 MG: ORAL | 90 days supply | Qty: 90 | Fill #1 | Status: AC

## 2020-09-12 MED FILL — Diclofenac Sodium Gel 1% (1.16% Diethylamine Equiv): CUTANEOUS | 30 days supply | Qty: 100 | Fill #0 | Status: AC

## 2020-09-13 ENCOUNTER — Other Ambulatory Visit (HOSPITAL_COMMUNITY): Payer: Self-pay

## 2020-09-13 DIAGNOSIS — M25562 Pain in left knee: Secondary | ICD-10-CM | POA: Diagnosis not present

## 2020-09-13 DIAGNOSIS — M545 Low back pain, unspecified: Secondary | ICD-10-CM | POA: Diagnosis not present

## 2020-09-13 MED ORDER — PREDNISONE 10 MG (21) PO TBPK
ORAL_TABLET | ORAL | 0 refills | Status: DC
  Filled 2020-09-13: qty 21, 6d supply, fill #0

## 2020-09-14 ENCOUNTER — Other Ambulatory Visit (HOSPITAL_COMMUNITY): Payer: Self-pay | Admitting: Orthopedic Surgery

## 2020-09-14 ENCOUNTER — Ambulatory Visit: Payer: 59

## 2020-09-14 ENCOUNTER — Other Ambulatory Visit: Payer: Self-pay | Admitting: Orthopedic Surgery

## 2020-09-14 DIAGNOSIS — M25562 Pain in left knee: Secondary | ICD-10-CM

## 2020-09-20 ENCOUNTER — Other Ambulatory Visit (HOSPITAL_COMMUNITY): Payer: Self-pay

## 2020-09-20 MED FILL — Azelastine HCl Ophth Soln 0.05%: OPHTHALMIC | 30 days supply | Qty: 6 | Fill #0 | Status: AC

## 2020-09-27 ENCOUNTER — Ambulatory Visit (HOSPITAL_COMMUNITY)
Admission: RE | Admit: 2020-09-27 | Discharge: 2020-09-27 | Disposition: A | Discharge status: Home / Self Care | Payer: PRIVATE HEALTH INSURANCE | Source: Ambulatory Visit | Attending: Orthopedic Surgery | Admitting: Orthopedic Surgery

## 2020-09-27 ENCOUNTER — Other Ambulatory Visit: Payer: Self-pay

## 2020-09-27 ENCOUNTER — Ambulatory Visit (HOSPITAL_COMMUNITY): Admission: RE | Admit: 2020-09-27 | Payer: 59 | Source: Ambulatory Visit

## 2020-09-27 ENCOUNTER — Ambulatory Visit: Payer: PRIVATE HEALTH INSURANCE

## 2020-09-27 DIAGNOSIS — M25562 Pain in left knee: Secondary | ICD-10-CM | POA: Insufficient documentation

## 2020-09-29 ENCOUNTER — Ambulatory Visit: Payer: PRIVATE HEALTH INSURANCE

## 2020-10-04 ENCOUNTER — Other Ambulatory Visit: Payer: Self-pay

## 2020-10-04 ENCOUNTER — Ambulatory Visit: Payer: PRIVATE HEALTH INSURANCE | Attending: Physician Assistant

## 2020-10-04 DIAGNOSIS — M25611 Stiffness of right shoulder, not elsewhere classified: Secondary | ICD-10-CM | POA: Diagnosis present

## 2020-10-04 DIAGNOSIS — M25511 Pain in right shoulder: Secondary | ICD-10-CM | POA: Insufficient documentation

## 2020-10-04 DIAGNOSIS — R600 Localized edema: Secondary | ICD-10-CM | POA: Diagnosis present

## 2020-10-04 DIAGNOSIS — M6281 Muscle weakness (generalized): Secondary | ICD-10-CM | POA: Insufficient documentation

## 2020-10-04 DIAGNOSIS — M25562 Pain in left knee: Secondary | ICD-10-CM | POA: Diagnosis present

## 2020-10-04 DIAGNOSIS — M542 Cervicalgia: Secondary | ICD-10-CM | POA: Insufficient documentation

## 2020-10-04 DIAGNOSIS — G8929 Other chronic pain: Secondary | ICD-10-CM | POA: Insufficient documentation

## 2020-10-04 DIAGNOSIS — R293 Abnormal posture: Secondary | ICD-10-CM | POA: Diagnosis present

## 2020-10-05 NOTE — Therapy (Signed)
[place], [name], [address] Phone: [phone]   Fax:  [phone]  Physical Therapy Treatment  Patient Details  Name: MANEH SIEBEN MRN: [id number] Date of Birth: May 11, 1965 Referring Provider (PT): Tiney Rouge, [place]   Encounter Date: 10/04/2020   PT End of Session - 10/05/20 0539     Visit Number 3    Number of Visits 13    Date for PT Re-Evaluation 11/26/20    Authorization Type UMR - [place]    Progress Note Due on Visit 10    PT Start Time 1503    PT Stop Time 1545    PT Time Calculation (min) 42 min    Activity Tolerance Patient tolerated treatment well;No increased pain    Behavior During Therapy [facility] for tasks assessed/performed             Past Medical History:  Diagnosis Date   Allergic rhinitis    Anemia    Anginal pain ([place])    Chest Pains 8/16   Eczema    Environmental allergies    Essential hypertension 07/07/2014   GERD (gastroesophageal reflux disease)    Heart murmur    High cholesterol    Hyperlipidemia 07/07/2014   Hypertension    Hypoglycemia    Hypoglycemia    Plantar fasciitis    S/P TAH (total abdominal hysterectomy) 03/10/2015   Sinusitis, chronic    Vasovagal syncope 07/07/2014    Past Surgical History:  Procedure Laterality Date   ABDOMINAL HYSTERECTOMY N/A 03/10/2015   Procedure: HYSTERECTOMY ABDOMINAL;  Surgeon: Thurnell Lose, MD;  Location: [facility];  Service: Gynecology;  Laterality: N/A;   BILATERAL SALPINGECTOMY Bilateral 03/10/2015   pt denies   CHOLECYSTECTOMY     FOOT SURGERY      There were no vitals filed for this visit.   Subjective Assessment - 10/05/20 0531     Subjective Pt reports when she wakes in the AM her R shoulder pain is 0/10 or low and the pain increases to 5-6/10 by mid day with working. Pt reports on 6/16 she slipped at work hurting her L knee. She states she is going to have arthroscopic surgery and then will be  referred to PT. Pt notes she is currently on light duty.    Patient Stated Goals not to have to take any medicine    Currently in Pain? Yes    Pain Score 5     Pain Location Neck    Pain Orientation Right    Pain Descriptors / Indicators Aching;Tightness    Pain Type Chronic pain    Pain Radiating Towards to R shoulder    Pain Onset More than a month ago    Pain Frequency Intermittent                               [facility] Adult PT Treatment/Exercise - 10/05/20 0001       Manual Therapy   Manual Therapy Soft tissue mobilization    Soft tissue mobilization STM to the bilat upper traps, scalenes, and levator c trigger point release. Palpable twitch achieved. Palpable decreased muscle tightness afterwards.      Neck Exercises: Stretches   Upper Trapezius Stretch Right;20 seconds;3 reps    Levator Stretch Right;3 reps;20 seconds    Chest Stretch 3 reps;20 seconds   60d doorway   Other Neck Stretches Scalene R; 3 reps, 20 sec  Other Neck Stretches Cervical retraction, 10x, 3 sce                    PT Education - 10/05/20 0538     Education Details Review of HEP    Person(s) Educated Patient    Methods Explanation;Demonstration;Tactile cues;Verbal cues    Comprehension Verbalized understanding;Returned demonstration;Verbal cues required;Tactile cues required              PT Short Term Goals - 10/05/20 0549       PT SHORT TERM GOAL #1   Title PT will have pt perform FOTO in next 1-2 visits and create appropriate goal    Status Not Met    Target Date 10/06/20               PT Long Term Goals - 10/05/20 0549       PT LONG TERM GOAL #1   Title She will be independnet with all HEP issued for pain management    Baseline Has been indep with prior HEP for her neck 08/22/20    Status On-going    Target Date 11/26/20      PT LONG TERM GOAL #2   Title Pt will report no pain with end range shoulder motions    Baseline Pain at end range IR,  extension, and flexion with MMT    Status On-going    Target Date 11/26/20      PT LONG TERM GOAL #3   Title Pt will have improved bilat grip strength to at least 35# for improved UE function    Status On-going    Target Date 11/26/20      PT LONG TERM GOAL #4   Title Patient will perfrom work tasks without increased pain    Status On-going    Target Date 11/26/20                   Plan - 10/05/20 0540     Clinical Impression Statement Pt was completed today for R upper shoulder stretching of the upper trap, scalenes, and levator muscles. Stretching was f/b STM with trigger point release. Decreased muscle tightness palpated after STM and trigger point release. Following the session, pt reported muscle soreness from the massage, but the pain she was experiencing when she arrived for PT was resolved. P has ony been able to attend 3 PT sessions including the eval, pt will benefit from continued 1w6 to address R upper shoulder/neck pain and function.    Personal Factors and Comorbidities Profession    Examination-Activity Limitations Lift    Examination-Participation Restrictions Cleaning;Occupation    Stability/Clinical Decision Making Stable/Uncomplicated    Clinical Decision Making Low    Rehab Potential Excellent    PT Frequency 1x / week    PT Duration 6 weeks    PT Treatment/Interventions Ultrasound;Moist Heat;Electrical Stimulation;Functional mobility training;Neuromuscular re-education;Therapeutic exercise;Therapeutic activities;Patient/family education;Manual techniques;Passive range of motion;Dry needling;Taping    PT Next Visit Plan Continue neck and midback/scapular strengthening. Continue manual therapy and stretching as indicated. TENS, TPDN, STM    PT Home Exercise Plan Access Code: [email or web address]; pt to work on scalene stretch, first rib mobilization and pec stretching to decompress nerves,    Consulted and Agree with Plan of Care Patient             Patient will  benefit from skilled therapeutic intervention in order to improve the following deficits and impairments:  Pain, Decreased activity tolerance, Decreased strength, Increased muscle  spasms, Postural dysfunction, Hypomobility, Impaired flexibility, Impaired UE functional use  Visit Diagnosis: Abnormal posture  Cervicalgia  Chronic right shoulder pain  Stiffness of right shoulder, not elsewhere classified     Problem List Patient Active Problem List   Diagnosis Date Noted   S/P TAH (total abdominal hysterectomy) 03/10/2015   Vasovagal syncope 07/07/2014   Essential hypertension 07/07/2014   Hyperlipidemia 07/07/2014   Gar Ponto MS, PT 10/05/20 6:05 AM   [facility] [address] [place], [name], [address] Phone: [phone]   Fax:  [phone]  Name: CHERL GORNEY MRN: [id number] Date of Birth: 28-Sep-1965

## 2020-10-06 ENCOUNTER — Ambulatory Visit: Payer: PRIVATE HEALTH INSURANCE

## 2020-10-06 ENCOUNTER — Other Ambulatory Visit: Payer: Self-pay | Admitting: Family Medicine

## 2020-10-06 ENCOUNTER — Other Ambulatory Visit: Payer: Self-pay

## 2020-10-06 DIAGNOSIS — R293 Abnormal posture: Secondary | ICD-10-CM

## 2020-10-06 DIAGNOSIS — Z1231 Encounter for screening mammogram for malignant neoplasm of breast: Secondary | ICD-10-CM

## 2020-10-06 DIAGNOSIS — M25611 Stiffness of right shoulder, not elsewhere classified: Secondary | ICD-10-CM

## 2020-10-06 DIAGNOSIS — M542 Cervicalgia: Secondary | ICD-10-CM

## 2020-10-06 DIAGNOSIS — G8929 Other chronic pain: Secondary | ICD-10-CM

## 2020-10-06 NOTE — Patient Instructions (Signed)

## 2020-10-07 NOTE — Therapy (Signed)
[place], [name], [address] Phone: [phone]   Fax:  [phone]  Physical Therapy Treatment  Patient Details  Name: MANON BANBURY MRN: [id number] Date of Birth: [birth year] Referring Provider (PT): Tiney Rouge, [place]   Encounter Date: 10/06/2020   PT End of Session - 10/06/20 1527     Visit Number 4    Number of Visits 13    Date for PT Re-Evaluation 11/26/20    Authorization Type [employer] - [place]    Progress Note Due on Visit 10    PT Start Time 1500    PT Stop Time 1545    PT Time Calculation (min) 45 min    Activity Tolerance Patient tolerated treatment well;No increased pain    Behavior During Therapy [facility] for tasks assessed/performed             Past Medical History:  Diagnosis Date   Allergic rhinitis    Anemia    Anginal pain ([place])    Chest Pains 8/16   Eczema    Environmental allergies    Essential hypertension 07/07/2014   GERD (gastroesophageal reflux disease)    Heart murmur    High cholesterol    Hyperlipidemia 07/07/2014   Hypertension    Hypoglycemia    Hypoglycemia    Plantar fasciitis    S/P TAH (total abdominal hysterectomy) 03/10/2015   Sinusitis, chronic    Vasovagal syncope 07/07/2014    Past Surgical History:  Procedure Laterality Date   ABDOMINAL HYSTERECTOMY N/A 03/10/2015   Procedure: HYSTERECTOMY ABDOMINAL;  Surgeon: Thurnell Lose, MD;  Location: [facility];  Service: Gynecology;  Laterality: N/A;   BILATERAL SALPINGECTOMY Bilateral 03/10/2015   pt denies   CHOLECYSTECTOMY     FOOT SURGERY      There were no vitals filed for this visit.   Subjective Assessment - 10/06/20 1505     Subjective Pt reports the R upper shoulder feels better following the STM/trigger point release complete the last session.    Diagnostic tests XRay 09/29/19 demo straightening of cervical spine with C4-6 degeneration; no shoulder abnormalities    Patient Stated Goals not to  have to take any medicine    Currently in Pain? Yes    Pain Score 4     Pain Location Shoulder    Pain Orientation Right    Pain Descriptors / Indicators Aching;Tightness    Pain Type Chronic pain    Pain Onset More than a month ago    Pain Frequency Intermittent                               [facility] Adult PT Treatment/Exercise - 10/07/20 0001       Shoulder Exercises: ROM/Strengthening   UBE (Upper Arm Bike) 10 mns; L 1      Electrical Stimulation   Electrical Stimulation Location R upper trap    Electrical Stimulation Action modulated TENs    Electrical Stimulation Parameters intensity as tolerated    Electrical Stimulation Goals Pain      Manual Therapy   Manual Therapy Soft tissue mobilization    Soft tissue mobilization STM to the bilat upper traps, scalenes, and levator c trigger point release. Palpable twitch achieved. Palpable decreased muscle tightness afterwards.                    PT Education - 10/06/20 1524  Education Details use of TENS for pain management    Person(s) Educated Patient    Methods Explanation;Demonstration;Tactile cues;Verbal cues;Handout    Comprehension Verbalized understanding;Returned demonstration;Verbal cues required;Tactile cues required              PT Short Term Goals - 10/05/20 0549       PT SHORT TERM GOAL #1   Title PT will have pt perform FOTO in next 1-2 visits and create appropriate goal    Status Not Met    Target Date 10/06/20               PT Long Term Goals - 10/05/20 0549       PT LONG TERM GOAL #1   Title She will be independnet with all HEP issued for pain management    Baseline Has been indep with prior HEP for her neck 08/22/20    Status On-going    Target Date 11/26/20      PT LONG TERM GOAL #2   Title Pt will report no pain with end range shoulder motions    Baseline Pain at end range IR, extension, and flexion with MMT    Status On-going    Target Date 11/26/20       PT LONG TERM GOAL #3   Title Pt will have improved bilat grip strength to at least 35# for improved UE function    Status On-going    Target Date 11/26/20      PT LONG TERM GOAL #4   Title Patient will perfrom work tasks without increased pain    Status On-going    Target Date 11/26/20                   Plan - 10/07/20 0610     Clinical Impression Statement PTwas completed for a trail of TENS use with a facility unit. Pt received modulated TENS for 20 mins to the R upper trap area. After 76mns, pt reported no pain in the area anf continued to experience no pain while using the UBE for 10 mins. The TENS was effective in reducing the pt's R upper shoulder pain during activity. Pt was provided info re: where to purchase a unit and use of a TENS unit. STM c trigger pont release to the R upper trap, scalenes, and levator with the muscle tension palpably decreased afterward.    Personal Factors and Comorbidities Profession    Examination-Activity Limitations Lift    Examination-Participation Restrictions Cleaning;Occupation    Stability/Clinical Decision Making Stable/Uncomplicated    Clinical Decision Making Low    Rehab Potential Excellent    PT Frequency 1x / week    PT Duration 6 weeks    PT Treatment/Interventions Ultrasound;Moist Heat;Electrical Stimulation;Functional mobility training;Neuromuscular re-education;Therapeutic exercise;Therapeutic activities;Patient/family education;Manual techniques;Passive range of motion;Dry needling;Taping    PT Next Visit Plan PT for r upper shoulder pain will be place on hold, while pt receives care for a L knee injury sustained at work. Will provide further instruction for use of the TENS unit as pt obtains.    PT Home Exercise Plan Access Code: [email or web address] pt to work on scalene stretch, first rib mobilization and pec stretching to decompress nerves,    Consulted and Agree with Plan of Care Patient             Patient will benefit  from skilled therapeutic intervention in order to improve the following deficits and impairments:  Pain, Decreased activity tolerance, Decreased strength, Increased muscle  spasms, Postural dysfunction, Hypomobility, Impaired flexibility, Impaired UE functional use  Visit Diagnosis: Abnormal posture  Cervicalgia  Chronic right shoulder pain  Stiffness of right shoulder, not elsewhere classified     Problem List Patient Active Problem List   Diagnosis Date Noted   S/P TAH (total abdominal hysterectomy) 03/10/2015   Vasovagal syncope 07/07/2014   Essential hypertension 07/07/2014   Hyperlipidemia 07/07/2014    Gar Ponto MS, PT 10/07/20 8:35 AM   [facility] [address] [place], [name], [address] Phone: [phone]   Fax:  [phone]  Name: PERINA SALVAGGIO MRN: [id number] Date of Birth: 1965-05-18

## 2020-10-08 ENCOUNTER — Ambulatory Visit: Payer: PRIVATE HEALTH INSURANCE | Admitting: Physical Therapy

## 2020-10-08 ENCOUNTER — Other Ambulatory Visit: Payer: Self-pay

## 2020-10-08 ENCOUNTER — Encounter: Payer: Self-pay | Admitting: Physical Therapy

## 2020-10-08 DIAGNOSIS — G8929 Other chronic pain: Secondary | ICD-10-CM

## 2020-10-08 DIAGNOSIS — M6281 Muscle weakness (generalized): Secondary | ICD-10-CM

## 2020-10-08 DIAGNOSIS — R293 Abnormal posture: Secondary | ICD-10-CM | POA: Diagnosis not present

## 2020-10-08 DIAGNOSIS — R600 Localized edema: Secondary | ICD-10-CM

## 2020-10-08 NOTE — Therapy (Signed)
[place], [name], [address] Phone: [phone]   Fax:  [phone]  Physical Therapy Evaluation  Patient Details  Name: Heather Rios MRN: [id number] Date of Birth: 1966/01/23 Referring Provider (PT): Dorna Leitz, MD (for knee) Tiney Rouge [facility])   Encounter Date: 10/08/2020   PT End of Session - 10/08/20 1036     Visit Number 5   Workers Comp visits 1   Number of Visits 14    Date for PT Re-Evaluation 12/03/20    Authorization - Visit Number 1    Authorization - Number of Visits 9    PT Start Time [id number]    PT Stop Time 1115    PT Time Calculation (min) 43 min    Activity Tolerance Patient tolerated treatment well;No increased pain    Behavior During Therapy [facility] for tasks assessed/performed             Past Medical History:  Diagnosis Date   Allergic rhinitis    Anemia    Anginal pain ([place])    Chest Pains 8/16   Eczema    Environmental allergies    Essential hypertension 07/07/2014   GERD (gastroesophageal reflux disease)    Heart murmur    High cholesterol    Hyperlipidemia 07/07/2014   Hypertension    Hypoglycemia    Hypoglycemia    Plantar fasciitis    S/P TAH (total abdominal hysterectomy) 03/10/2015   Sinusitis, chronic    Vasovagal syncope 07/07/2014    Past Surgical History:  Procedure Laterality Date   ABDOMINAL HYSTERECTOMY N/A 03/10/2015   Procedure: HYSTERECTOMY ABDOMINAL;  Surgeon: Thurnell Lose, MD;  Location: [facility];  Service: Gynecology;  Laterality: N/A;   BILATERAL SALPINGECTOMY Bilateral 03/10/2015   pt denies   CHOLECYSTECTOMY     FOOT SURGERY      There were no vitals filed for this visit.    Subjective Assessment - 10/08/20 1038     Subjective pt arries L knee pain. She reported on 6/16 she was walking through the hall way and it had been previously waxed but noted there were no caution signs. She slipped and fell landing on her L knee and ultimately  causing a wound on the patella.  That day the pain increased, it started to swelled and she end up going to the health at work MD.  She was placed on light duty, and noted the pain was continuing to get worse, and went to the MD and they did imaging and ruled out fx. She got a referral to a the orthopedic MD an was given a injection.    How long can you sit comfortably? 20 -30 min    How long can you stand comfortably? 10 - 15 min    How long can you walk comfortably? 10 -15 min    Diagnostic tests 09/29/2020 MRI IMPRESSION:  1. 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  2. Intrasubstance degeneration of the medial and lateral menisci  without discrete tear.  3. Mild edema within the superolateral aspect of Hoffa's fat pad,  which can be seen in the setting of patellar tendon-lateral femoral  condyle friction syndrome.    Patient Stated Goals strengthening    Currently in Pain? Yes    Pain Score 4     Pain Location Knee    Pain Orientation Left    Pain Descriptors / Indicators Tightness    Pain Type Chronic pain    Pain  Onset More than a month ago    Pain Frequency Constant    Aggravating Factors  standing and walking for long periods    Pain Relieving Factors sitting, biofreeze, voltaren                [facility] PT Assessment - 10/08/20 0001       Assessment   Medical Diagnosis L knee   Posture -- bilat neck & upper shoulder pain  (on hold at the moment)   Referring Provider (PT) Dorna Leitz, MD (for knee)   Tiney Rouge [facility]   Hand Dominance Right    Next MD Visit 10/18/2020   to schedule surgery with ortho   Prior Therapy was current being seen for neck and shoulder      Restrictions   Weight Bearing Restrictions Yes    Other Position/Activity Restrictions no prolonged standing, sit every 2 hours      Balance Screen   Has the patient fallen in the past 6 months Yes      [place] residence      Prior Function    Level of Independence Independent    Vocation Full time employment      Observation/Other Assessments   Focus on Therapeutic Outcomes (FOTO)  L knee 41%   predicted 57%     Observation/Other Assessments-Edema    Edema Circumferential      Circumferential Edema   Circumferential - Right 36.6    Circumferential - Left  37.1      Posture/Postural Control   Posture/Postural Control Postural limitations    Postural Limitations Forward head      AROM   AROM Assessment Site Knee    Right/Left Knee Right;Left    Right Knee Extension 0    Right Knee Flexion 133    Left Knee Extension 0    Left Knee Flexion 124      PROM   PROM Assessment Site Knee    Right/Left Knee Left      Strength   Strength Assessment Site Knee;Hip    Right/Left Hip Right;Left    Right Hip ABduction 4+/5    Left Hip ABduction 3+/5    Right Knee Flexion 5/5    Right Knee Extension 5/5    Left Knee Flexion 4/5    Left Knee Extension 4/5                        Objective measurements completed on examination: See above findings.       [place] Adult PT Treatment/Exercise - 10/08/20 0001       Knee/Hip Exercises: Seated   Sit to Sand 10 reps;1 set;without UE support   verbal cues for proper form with nose over toes     Knee/Hip Exercises: Supine   Straight Leg Raises Strengthening;1 set;10 reps;Left      Knee/Hip Exercises: Sidelying   Hip ABduction 1 set;Strengthening;Left;10 reps                    PT Education - 10/08/20 1059     Education Details Evaluation findings for knee, POC, goals, HEP with proper form. FOTO assessment.    Person(s) Educated Patient    Methods Explanation;Verbal cues;Handout    Comprehension Verbalized understanding;Verbal cues required              PT Short Term Goals - 10/08/20 1127       PT SHORT TERM  GOAL #1   Title pt to be IND with initial HEP    Time 3    Period Weeks    Status New    Target Date 10/29/20                PT Long Term Goals - 10/08/20 1128       PT LONG TERM GOAL #1   Title She will be independnet with all HEP issued for pain management    Status Unable to assess      PT LONG TERM GOAL #2   Title Pt will report no pain with end range shoulder motions    Status Unable to assess      PT LONG TERM GOAL #3   Title Pt will have improved bilat grip strength to at least 35# for improved UE function    Status Unable to assess      PT LONG TERM GOAL #4   Title Patient will perfrom work tasks without increased pain    Status Unable to assess      PT LONG TERM GOAL #5   Title increase LLE strength to >/= 4+/5 to promote knee stability    Baseline -    Period Weeks    Status New    Target Date 12/03/20      Additional Long Term Goals   Additional Long Term Goals Yes      PT LONG TERM GOAL #6   Title pt to be able to walk/ stand for >/= 1 hour with </= 2/10 max pain for functional endurance required for ALDs and work related endurance    Time 6    Period Weeks    Status New    Target Date 11/19/20      PT LONG TERM GOAL #7   Title increase FOTO score to >/=57% to demo improvement in function    Time 6    Period Weeks    Status New    Target Date 11/19/20      PT LONG TERM GOAL #8   Title pt to be IND with all HEP and is able to maintain her current LOF regarding her LLE and will be discharged from PT.    Time 6    Period Weeks    Status New    Target Date 11/19/20                    Plan - 10/08/20 1058     Clinical Impression Statement Mrs Mcphatter is a pleasant 55 y.o F that was currently being seen for her neck/ back and had a new referral for her L knee. She fell at work on 6/16 slipping on a wet floor landing on her L knee. She has functional knee L ROM but does have mild flexion limitatoin when compared bil, and gross hip/ knee weakness. TTP noted along med/ lateral joint line. she has increased L knee swelling that she notes increased with prolonged  standing/ walking. She would benefit from physical therapy to reduce L knee pain, improve gross hip/ knee strength and maximize her function by addressing the deficits noted. due to high priority nature of her knee plan to see her for her knee and hold off on her neck/ back until her knee has been discharged per workers comp.    Personal Factors and Comorbidities Comorbidity 1;Profession    Comorbidities hx of anemia,    Examination-Activity Limitations Stand;Locomotion Level    Examination-Participation Restrictions  Cleaning;Occupation    Stability/Clinical Decision Making Evolving/Moderate complexity    Clinical Decision Making Moderate    Rehab Potential Excellent    PT Frequency 2x / week    PT Duration 6 weeks    PT Treatment/Interventions Ultrasound;Moist Heat;Electrical Stimulation;Functional mobility training;Neuromuscular re-education;Therapeutic exercise;Therapeutic activities;Patient/family education;Manual techniques;Passive range of motion;Dry needling;Taping;ADLs/Self Care Home Management;Cryotherapy;Iontophoresis '4mg'$ /ml Dexamethasone;Gait training;Stair training;Balance training;Vasopneumatic Device    PT Next Visit Plan hold off on neck/ back, review HEP for the L knee, gross hip/ knee strengthening, balance training, quad  stretching.    PT Home Exercise Plan Access Code: [email or web address] for neck,   for knee [email or web address]    Consulted and Agree with Plan of Care Patient             Patient will benefit from skilled therapeutic intervention in order to improve the following deficits and impairments:  Pain, Decreased activity tolerance, Decreased strength, Increased muscle spasms, Postural dysfunction, Hypomobility, Impaired flexibility, Impaired UE functional use, Improper body mechanics, Increased edema, Decreased balance, Decreased endurance, Abnormal gait  Visit Diagnosis: Chronic pain of left knee  Localized edema  Muscle weakness (generalized)     Problem List Patient  Active Problem List   Diagnosis Date Noted   S/P TAH (total abdominal hysterectomy) 03/10/2015   Vasovagal syncope 07/07/2014   Essential hypertension 07/07/2014   Hyperlipidemia 07/07/2014   Starr Lake PT, DPT, LAT, ATC  10/08/20  11:33 AM      [facility] [address] [place], [name], [address] Phone: [phone]   Fax:  [phone]  Name: Heather Rios MRN: [id number] Date of Birth: 10/13/1965

## 2020-10-13 ENCOUNTER — Other Ambulatory Visit: Payer: Self-pay

## 2020-10-13 ENCOUNTER — Ambulatory Visit: Payer: PRIVATE HEALTH INSURANCE

## 2020-10-13 DIAGNOSIS — G8929 Other chronic pain: Secondary | ICD-10-CM

## 2020-10-13 DIAGNOSIS — R293 Abnormal posture: Secondary | ICD-10-CM | POA: Diagnosis not present

## 2020-10-13 DIAGNOSIS — M6281 Muscle weakness (generalized): Secondary | ICD-10-CM

## 2020-10-13 DIAGNOSIS — R600 Localized edema: Secondary | ICD-10-CM

## 2020-10-13 DIAGNOSIS — M25562 Pain in left knee: Secondary | ICD-10-CM

## 2020-10-14 NOTE — Therapy (Signed)
[place], [name], [address] Phone: [phone]   Fax:  [phone]  Physical Therapy Treatment  Patient Details  Name: Heather Rios MRN: [id number] Date of Birth: [birth year] Referring Provider (PT): Dorna Leitz, MD (for knee) [facility])   Encounter Date: 10/13/2020   PT End of Session - 10/14/20 0536     Visit Number 6   Workers Comp visits 2   Number of Visits 14    Date for PT Re-Evaluation 12/03/20    Authorization Type UMR - [place]    Authorization - Visit Number 2    Authorization - Number of Visits 9    Progress Note Due on Visit 10    PT Start Time 1504    PT Stop Time 1600    PT Time Calculation (min) 56 min    Activity Tolerance Patient tolerated treatment well;No increased pain    Behavior During Therapy [facility] for tasks assessed/performed             Past Medical History:  Diagnosis Date   Allergic rhinitis    Anemia    Anginal pain ([place])    Chest Pains 8/16   Eczema    Environmental allergies    Essential hypertension 07/07/2014   GERD (gastroesophageal reflux disease)    Heart murmur    High cholesterol    Hyperlipidemia 07/07/2014   Hypertension    Hypoglycemia    Hypoglycemia    Plantar fasciitis    S/P TAH (total abdominal hysterectomy) 03/10/2015   Sinusitis, chronic    Vasovagal syncope 07/07/2014    Past Surgical History:  Procedure Laterality Date   ABDOMINAL HYSTERECTOMY N/A 03/10/2015   Procedure: HYSTERECTOMY ABDOMINAL;  Surgeon: Thurnell Lose, MD;  Location: [facility];  Service: Gynecology;  Laterality: N/A;   BILATERAL SALPINGECTOMY Bilateral 03/10/2015   pt denies   CHOLECYSTECTOMY     FOOT SURGERY      There were no vitals filed for this visit.   Subjective Assessment - 10/13/20 1513     Subjective Pt reports a busy day at work and her knee is swollen day. Pt reports she will find out about a surgery date on 10/18/20. Pt reports a  clicking sensation c the LAQ ex.    Diagnostic tests 09/29/2020 MRI IMPRESSION:  1. 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  2. Intrasubstance degeneration of the medial and lateral menisci  without discrete tear.  3. Mild edema within the superolateral aspect of Hoffa's fat pad,  which can be seen in the setting of patellar tendon-lateral femoral  condyle friction syndrome.    Patient Stated Goals strengthening    Currently in Pain? Yes    Pain Score 6     Pain Location Knee    Pain Orientation Left;Lateral    Pain Descriptors / Indicators Tightness;Sore    Pain Type Chronic pain    Pain Onset More than a month ago    Pain Frequency Constant                               [facility] Adult PT Treatment/Exercise - 10/14/20 0001       Exercises   Exercises Knee/Hip      Knee/Hip Exercises: Standing   Knee Flexion Left;2 sets;10 reps    Knee Flexion Limitations pt developed a hamstring cramp and the ex was discontinued    Hip Extension  Left;3 sets;10 reps;Knee straight    Extension Limitations plantargrade      Knee/Hip Exercises: Seated   Sit to Sand 1 set;without UE support;3 sets;2 sets   verbal cues for proper form with nose over toes     Knee/Hip Exercises: Supine   Straight Leg Raises Strengthening;10 reps;Left;3 sets    Straight Leg Raises Limitations quad sets prior      Knee/Hip Exercises: Sidelying   Hip ABduction Strengthening;Left;10 reps;3 sets    Clams L, 10x3,      Knee/Hip Exercises: Prone   Hip Extension Limitations Attempted pt c/o of low back pain and ex was modified to standing                    PT Education - 10/14/20 0536     Education Details HEP update    Person(s) Educated Patient    Methods Explanation;Demonstration;Tactile cues;Verbal cues    Comprehension Verbalized understanding;Returned demonstration;Verbal cues required;Tactile cues required              PT Short Term Goals -  10/08/20 1127       PT SHORT TERM GOAL #1   Title pt to be IND with initial HEP    Time 3    Period Weeks    Status New    Target Date 10/29/20               PT Long Term Goals - 10/08/20 1128       PT LONG TERM GOAL #1   Title She will be independnet with all HEP issued for pain management    Status Unable to assess      PT LONG TERM GOAL #2   Title Pt will report no pain with end range shoulder motions    Status Unable to assess      PT LONG TERM GOAL #3   Title Pt will have improved bilat grip strength to at least 35# for improved UE function    Status Unable to assess      PT LONG TERM GOAL #4   Title Patient will perfrom work tasks without increased pain    Status Unable to assess      PT LONG TERM GOAL #5   Title increase LLE strength to >/= 4+/5 to promote knee stability    Baseline -    Period Weeks    Status New    Target Date 12/03/20      Additional Long Term Goals   Additional Long Term Goals Yes      PT LONG TERM GOAL #6   Title pt to be able to walk/ stand for >/= 1 hour with </= 2/10 max pain for functional endurance required for ALDs and work related endurance    Time 6    Period Weeks    Status New    Target Date 11/19/20      PT LONG TERM GOAL #7   Title increase FOTO score to >/=57% to demo improvement in function    Time 6    Period Weeks    Status New    Target Date 11/19/20      PT LONG TERM GOAL #8   Title pt to be IND with all HEP and is able to maintain her current LOF regarding her LLE and will be discharged from PT.    Time 6    Period Weeks    Status New    Target Date 11/19/20  Plan - 10/13/20 1534     Clinical Impression Statement Pt presents to PT with increased swelling of the L knee. Pt reports having a busy day at work. PT was provided for LE strengthening with HEP updated. Pt returned demonstration of exs. Pt found multiple sets of the hip strengthening exs challenging. With LAQ, pt  reporting a clicking sensation in the lateral joint space without pain. Following exercises vaso cold/compression was provided for the L knee. Pt tolerated the PT session without adverse effects.    Personal Factors and Comorbidities Comorbidity 1;Profession    Comorbidities hx of anemia,    Examination-Activity Limitations Stand;Locomotion Level    Examination-Participation Restrictions Cleaning;Occupation    Stability/Clinical Decision Making Evolving/Moderate complexity    Clinical Decision Making Low    Rehab Potential Excellent    PT Frequency 2x / week    PT Duration 6 weeks    PT Treatment/Interventions Ultrasound;Moist Heat;Electrical Stimulation;Functional mobility training;Neuromuscular re-education;Therapeutic exercise;Therapeutic activities;Patient/family education;Manual techniques;Passive range of motion;Dry needling;Taping;ADLs/Self Care Home Management;Cryotherapy;Iontophoresis '4mg'$ /ml Dexamethasone;Gait training;Stair training;Balance training;Vasopneumatic Device    PT Next Visit Plan hold off on neck/ back, review HEP for the L knee, gross hip/ knee strengthening, balance training, quad  stretching.    PT Home Exercise Plan Access Code: [email or web address] for neck,   for knee [email or web address]    Consulted and Agree with Plan of Care Patient             Patient will benefit from skilled therapeutic intervention in order to improve the following deficits and impairments:  Pain, Decreased activity tolerance, Decreased strength, Increased muscle spasms, Postural dysfunction, Hypomobility, Impaired flexibility, Impaired UE functional use, Improper body mechanics, Increased edema, Decreased balance, Decreased endurance, Abnormal gait  Visit Diagnosis: Chronic pain of left knee  Localized edema  Muscle weakness (generalized)     Problem List Patient Active Problem List   Diagnosis Date Noted   S/P TAH (total abdominal hysterectomy) 03/10/2015   Vasovagal syncope 07/07/2014   Essential  hypertension 07/07/2014   Hyperlipidemia 07/07/2014   Gar Ponto MS, PT 10/14/20 5:53 AM   [facility] [address] [place], [name], [address] Phone: [phone]   Fax:  [phone]  Name: Heather Rios MRN: [id number] Date of Birth: [birth year]

## 2020-10-17 ENCOUNTER — Ambulatory Visit: Payer: PRIVATE HEALTH INSURANCE | Attending: Physical Therapy | Admitting: Physical Therapy

## 2020-10-17 ENCOUNTER — Telehealth: Payer: Self-pay | Admitting: Physical Therapy

## 2020-10-17 DIAGNOSIS — G8929 Other chronic pain: Secondary | ICD-10-CM | POA: Insufficient documentation

## 2020-10-17 DIAGNOSIS — M6281 Muscle weakness (generalized): Secondary | ICD-10-CM | POA: Insufficient documentation

## 2020-10-17 DIAGNOSIS — M25562 Pain in left knee: Secondary | ICD-10-CM | POA: Insufficient documentation

## 2020-10-17 DIAGNOSIS — R600 Localized edema: Secondary | ICD-10-CM | POA: Insufficient documentation

## 2020-10-17 NOTE — Telephone Encounter (Signed)
Spoke with pt regarding missed appointment today. She stated she just started working a different shift and that she just woke up. She doesn't have any other appointments scheduled but is seeing the MD tomorrow to determine a surgery day/ time. I told her to call after her appointment and we can schedule her then.  Williard Keller PT, DPT, LAT, ATC  10/17/20  3:41 PM

## 2020-10-18 ENCOUNTER — Other Ambulatory Visit (HOSPITAL_COMMUNITY): Payer: Self-pay

## 2020-10-18 MED ORDER — MELOXICAM 15 MG PO TABS
ORAL_TABLET | ORAL | 0 refills | Status: DC
  Filled 2020-10-18: qty 30, 30d supply, fill #0

## 2020-10-21 ENCOUNTER — Ambulatory Visit
Admission: RE | Admit: 2020-10-21 | Discharge: 2020-10-21 | Disposition: A | Discharge status: Home / Self Care | Payer: Self-pay | Source: Ambulatory Visit | Attending: Family Medicine | Admitting: Family Medicine

## 2020-10-21 ENCOUNTER — Other Ambulatory Visit: Payer: Self-pay

## 2020-10-21 DIAGNOSIS — Z1231 Encounter for screening mammogram for malignant neoplasm of breast: Secondary | ICD-10-CM | POA: Diagnosis not present

## 2020-10-25 ENCOUNTER — Ambulatory Visit: Payer: PRIVATE HEALTH INSURANCE | Admitting: Physical Therapy

## 2020-10-25 ENCOUNTER — Other Ambulatory Visit: Payer: Self-pay

## 2020-10-25 ENCOUNTER — Encounter: Payer: Self-pay | Admitting: Physical Therapy

## 2020-10-25 DIAGNOSIS — M25562 Pain in left knee: Secondary | ICD-10-CM

## 2020-10-25 DIAGNOSIS — M6281 Muscle weakness (generalized): Secondary | ICD-10-CM

## 2020-10-25 DIAGNOSIS — R600 Localized edema: Secondary | ICD-10-CM | POA: Diagnosis present

## 2020-10-25 DIAGNOSIS — G8929 Other chronic pain: Secondary | ICD-10-CM

## 2020-10-25 NOTE — Therapy (Signed)
Heather Rios, [name], [address] Phone: [phone]   Fax:  [phone]  Physical Therapy Treatment  Patient Details  Name: Heather Rios MRN: [id number] Date of Birth: Feb 07, 1966 Referring Provider (PT): Dorna Leitz, MD (for knee)   Encounter Date: 10/25/2020   PT End of Session - 10/25/20 1018     Visit Number 7   3 workers comp visits   Number of Visits 14    Date for PT Re-Evaluation 12/03/20    Authorization Type UMR - [place]    Authorization - Visit Number 3    Authorization - Number of Visits 9    Progress Note Due on Visit 10    PT Start Time 1017    PT Stop Time 1056    PT Time Calculation (min) 39 min    Activity Tolerance Patient tolerated treatment well;No increased pain             Past Medical History:  Diagnosis Date   Allergic rhinitis    Anemia    Anginal pain ([place])    Chest Pains 8/16   Eczema    Environmental allergies    Essential hypertension 07/07/2014   GERD (gastroesophageal reflux disease)    Heart murmur    High cholesterol    Hyperlipidemia 07/07/2014   Hypertension    Hypoglycemia    Hypoglycemia    Plantar fasciitis    S/P TAH (total abdominal hysterectomy) 03/10/2015   Sinusitis, chronic    Vasovagal syncope 07/07/2014    Past Surgical History:  Procedure Laterality Date   ABDOMINAL HYSTERECTOMY N/A 03/10/2015   Procedure: HYSTERECTOMY ABDOMINAL;  Surgeon: Thurnell Lose, MD;  Location: [facility];  Service: Gynecology;  Laterality: N/A;   BILATERAL SALPINGECTOMY Bilateral 03/10/2015   pt denies   CHOLECYSTECTOMY     FOOT SURGERY      There were no vitals filed for this visit.   Subjective Assessment - 10/25/20 1022     Subjective " I am just getting off work today, the knee feels swollen but my issue is the L hip its at about a 6/10 today."    Diagnostic tests 09/29/2020 MRI IMPRESSION:  1. 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  2. Intrasubstance degeneration of the medial and lateral menisci  without discrete tear.  3. Mild edema within the superolateral aspect of Hoffa's fat pad,  which can be seen in the setting of patellar tendon-lateral femoral  condyle friction syndrome.    Currently in Pain? Yes    Pain Score 6     Pain Location Hip    Pain Orientation Left    Pain Descriptors / Indicators Aching;Sore    Pain Type Chronic pain    Pain Onset More than a month ago    Pain Frequency Intermittent    Aggravating Factors  standing/ walking                [facility] PT Assessment - 10/25/20 0001       Assessment   Medical Diagnosis L knee    Referring Provider (PT) Dorna Leitz, MD (for knee)                           [facility] Adult PT Treatment/Exercise - 10/25/20 0001       Knee/Hip Exercises: Stretches   ITB Stretch 2 reps;30 seconds;Left   in R sidelying     Knee/Hip Exercises:  Aerobic   Nustep L5 x 5 min LE only      Knee/Hip Exercises: Seated   Long Arc Quad 2 sets;15 reps;Strengthening;Left    Long Arc Quad Weight 4 lbs.    Sit to Sand 2 sets;10 reps   cues for rocking forward with standing, utilizing forward reaching     Knee/Hip Exercises: Supine   Straight Leg Raises Strengthening;2 sets;Left   12 reps     Shoulder Exercises: Sidelying   ABduction Strengthening;Left;12 reps   x 2 sets     Manual Therapy   Manual Therapy Other (comment)    Manual therapy comments MTPR along the L glute med/ min x 3    Other Manual Therapy DTM along the L glute med/ min                      PT Short Term Goals - 10/08/20 1127       PT SHORT TERM GOAL #1   Title pt to be IND with initial HEP    Time 3    Period Weeks    Status New    Target Date 10/29/20               PT Long Term Goals - 10/08/20 1128       PT LONG TERM GOAL #1   Title She will be independnet with all HEP issued for pain management    Status Unable to assess      PT  LONG TERM GOAL #2   Title Pt will report no pain with end range shoulder motions    Status Unable to assess      PT LONG TERM GOAL #3   Title Pt will have improved bilat grip strength to at least 35# for improved UE function    Status Unable to assess      PT LONG TERM GOAL #4   Title Patient will perfrom work tasks without increased pain    Status Unable to assess      PT LONG TERM GOAL #5   Title increase LLE strength to >/= 4+/5 to promote knee stability    Baseline -    Period Weeks    Status New    Target Date 12/03/20      Additional Long Term Goals   Additional Long Term Goals Yes      PT LONG TERM GOAL #6   Title pt to be able to walk/ stand for >/= 1 hour with </= 2/10 max pain for functional endurance required for ALDs and work related endurance    Time 6    Period Weeks    Status New    Target Date 11/19/20      PT LONG TERM GOAL #7   Title increase FOTO score to >/=57% to demo improvement in function    Time 6    Period Weeks    Status New    Target Date 11/19/20      PT LONG TERM GOAL #8   Title pt to be IND with all HEP and is able to maintain her current LOF regarding her LLE and will be discharged from PT.    Time 6    Period Weeks    Status New    Target Date 11/19/20                   Plan - 10/25/20 1057     PT Treatment/Interventions Ultrasound;Moist Heat;Electrical Stimulation;Functional  mobility training;Neuromuscular re-education;Therapeutic exercise;Therapeutic activities;Patient/family education;Manual techniques;Passive range of motion;Dry needling;Taping;ADLs/Self Care Home Management;Cryotherapy;Iontophoresis '4mg'$ /ml Dexamethasone;Gait training;Stair training;Balance training;Vasopneumatic Device    PT Next Visit Plan hold off on neck/ back, review HEP for the L knee, gross hip/ knee strengthening, balance training, quad  stretching.    PT Home Exercise Plan Access Code: [email or web address] for neck,   for knee [email or web address]    Consulted and Agree  with Plan of Care Patient             Patient will benefit from skilled therapeutic intervention in order to improve the following deficits and impairments:  Pain, Decreased activity tolerance, Decreased strength, Increased muscle spasms, Postural dysfunction, Hypomobility, Impaired flexibility, Impaired UE functional use, Improper body mechanics, Increased edema, Decreased balance, Decreased endurance, Abnormal gait  Visit Diagnosis: Chronic pain of left knee  Localized edema  Muscle weakness (generalized)     Problem List Patient Active Problem List   Diagnosis Date Noted   S/P TAH (total abdominal hysterectomy) 03/10/2015   Vasovagal syncope 07/07/2014   Essential hypertension 07/07/2014   Hyperlipidemia 07/07/2014   Starr Lake PT, DPT, LAT, ATC  10/25/20  10:58 AM      [facility] [address] [place], [name], [address] Phone: [phone]   Fax:  [phone]  Name: NIANG DOMM MRN: [id number] Date of Birth: 1965/07/30

## 2020-10-28 ENCOUNTER — Ambulatory Visit: Payer: PRIVATE HEALTH INSURANCE

## 2020-10-28 ENCOUNTER — Other Ambulatory Visit: Payer: Self-pay

## 2020-10-28 DIAGNOSIS — M25562 Pain in left knee: Secondary | ICD-10-CM

## 2020-10-28 DIAGNOSIS — R600 Localized edema: Secondary | ICD-10-CM

## 2020-10-28 DIAGNOSIS — M6281 Muscle weakness (generalized): Secondary | ICD-10-CM

## 2020-10-28 DIAGNOSIS — G8929 Other chronic pain: Secondary | ICD-10-CM

## 2020-10-28 NOTE — Therapy (Signed)
[place], [name], [address] Phone: [phone]   Fax:  [phone]  Physical Therapy Treatment  Patient Details  Name: Heather Rios MRN: [id number] Date of Birth: Nov 07, 1965 Referring Provider (PT): Dorna Leitz, MD (for knee)   Encounter Date: 10/28/2020   PT End of Session - 10/28/20 1130     Visit Number 8    Number of Visits 14    Date for PT Re-Evaluation 12/03/20    Authorization Type UMR - [place]    Authorization - Visit Number 4    Authorization - Number of Visits 9    Progress Note Due on Visit 10    PT Start Time 1019    PT Stop Time 1100    PT Time Calculation (min) 41 min    Activity Tolerance Patient tolerated treatment well;No increased pain    Behavior During Therapy [facility] for tasks assessed/performed             Past Medical History:  Diagnosis Date   Allergic rhinitis    Anemia    Anginal pain ([place])    Chest Pains 8/16   Eczema    Environmental allergies    Essential hypertension 07/07/2014   GERD (gastroesophageal reflux disease)    Heart murmur    High cholesterol    Hyperlipidemia 07/07/2014   Hypertension    Hypoglycemia    Hypoglycemia    Plantar fasciitis    S/P TAH (total abdominal hysterectomy) 03/10/2015   Sinusitis, chronic    Vasovagal syncope 07/07/2014    Past Surgical History:  Procedure Laterality Date   ABDOMINAL HYSTERECTOMY N/A 03/10/2015   Procedure: HYSTERECTOMY ABDOMINAL;  Surgeon: Thurnell Lose, MD;  Location: [facility];  Service: Gynecology;  Laterality: N/A;   BILATERAL SALPINGECTOMY Bilateral 03/10/2015   pt denies   CHOLECYSTECTOMY     FOOT SURGERY      There were no vitals filed for this visit.   Subjective Assessment - 10/28/20 1127     Subjective Pt reports she is not having L knee pain today, but still has swelling.    Diagnostic tests 09/29/2020 MRI IMPRESSION:  1. 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  2. Intrasubstance degeneration of the medial and lateral menisci  without discrete tear.  3. Mild edema within the superolateral aspect of Hoffa's fat pad,  which can be seen in the setting of patellar tendon-lateral femoral  condyle friction syndrome.    Patient Stated Goals strengthening    Currently in Pain? No/denies    Pain Score 0-No pain    Pain Location Knee    Pain Orientation Left    Pain Descriptors / Indicators Aching    Pain Type Chronic pain    Pain Onset More than a month ago    Pain Frequency Intermittent                               [facility] Adult PT Treatment/Exercise - 10/28/20 0001       Exercises   Exercises Knee/Hip      Knee/Hip Exercises: Aerobic   Nustep L5 x 5 min LE only      Knee/Hip Exercises: Seated   Long Arc Quad 2 sets;15 reps;Strengthening;Left    Long Arc Quad Weight 4 lbs.    Sit to Sand 2 sets;10 reps   cues for rocking forward with standing, utilizing forward reaching  Knee/Hip Exercises: Supine   Straight Leg Raises Strengthening;2 sets;Left   12 reps   Straight Leg Raises Limitations 3 lbs, quad sets prior   verbal cueing for complete knee ext to avoid ext lag     Knee/Hip Exercises: Sidelying   Hip ABduction Left;10 reps;3 sets    Hip ABduction Limitations 3 lbs    Clams L, 10x3    Other Sidelying Knee/Hip Exercises green Tband                      PT Short Term Goals - 10/08/20 1127       PT SHORT TERM GOAL #1   Title pt to be IND with initial HEP    Time 3    Period Weeks    Status New    Target Date 10/29/20               PT Long Term Goals - 10/08/20 1128       PT LONG TERM GOAL #1   Title She will be independnet with all HEP issued for pain management    Status Unable to assess      PT LONG TERM GOAL #2   Title Pt will report no pain with end range shoulder motions    Status Unable to assess      PT LONG TERM GOAL #3   Title Pt will have improved bilat  grip strength to at least 35# for improved UE function    Status Unable to assess      PT LONG TERM GOAL #4   Title Patient will perfrom work tasks without increased pain    Status Unable to assess      PT LONG TERM GOAL #5   Title increase LLE strength to >/= 4+/5 to promote knee stability    Baseline -    Period Weeks    Status New    Target Date 12/03/20      Additional Long Term Goals   Additional Long Term Goals Yes      PT LONG TERM GOAL #6   Title pt to be able to walk/ stand for >/= 1 hour with </= 2/10 max pain for functional endurance required for ALDs and work related endurance    Time 6    Period Weeks    Status New    Target Date 11/19/20      PT LONG TERM GOAL #7   Title increase FOTO score to >/=57% to demo improvement in function    Time 6    Period Weeks    Status New    Target Date 11/19/20      PT LONG TERM GOAL #8   Title pt to be IND with all HEP and is able to maintain her current LOF regarding her LLE and will be discharged from PT.    Time 6    Period Weeks    Status New    Target Date 11/19/20                   Plan - 10/28/20 1056     Clinical Impression Statement Pt presents to PT reporting no pain with her L knee, only swelling. PT was was provided for knee/LE strengthening primarily with oprn chain exs, but also with the closed chain ex. of bilat sit to stand. With SLR verbal cueing was provided to limit ext lag which occurs if pt does not focus on keeping her L  knee fully extended. L knee is improving.    Personal Factors and Comorbidities Comorbidity 1;Profession    Comorbidities hx of anemia,    Examination-Activity Limitations Stand;Locomotion Level    Examination-Participation Restrictions Cleaning;Occupation    Stability/Clinical Decision Making Evolving/Moderate complexity    Clinical Decision Making Low    PT Frequency 2x / week    PT Duration 6 weeks    PT Treatment/Interventions Ultrasound;Moist Heat;Electrical  Stimulation;Functional mobility training;Neuromuscular re-education;Therapeutic exercise;Therapeutic activities;Patient/family education;Manual techniques;Passive range of motion;Dry needling;Taping;ADLs/Self Care Home Management;Cryotherapy;Iontophoresis '4mg'$ /ml Dexamethasone;Gait training;Stair training;Balance training;Vasopneumatic Device    PT Next Visit Plan hold off on neck/ back, review HEP for the L knee, gross hip/ knee strengthening, balance training, quad  stretching. Incorporate more closed chain exs as tolerted.    PT Home Exercise Plan Access Code: [email or web address] for neck,   for knee [email or web address]    Consulted and Agree with Plan of Care Patient             Patient will benefit from skilled therapeutic intervention in order to improve the following deficits and impairments:  Pain, Decreased activity tolerance, Decreased strength, Increased muscle spasms, Postural dysfunction, Hypomobility, Impaired flexibility, Impaired UE functional use, Improper body mechanics, Increased edema, Decreased balance, Decreased endurance, Abnormal gait  Visit Diagnosis: Localized edema  Chronic pain of left knee  Muscle weakness (generalized)     Problem List Patient Active Problem List   Diagnosis Date Noted   S/P TAH (total abdominal hysterectomy) 03/10/2015   Vasovagal syncope 07/07/2014   Essential hypertension 07/07/2014   Hyperlipidemia 07/07/2014   Gar Ponto MS, PT 10/28/20 11:55 AM   [facility] [address] [place], [name], [address] Phone: [phone]   Fax:  [phone]  Name: Heather Rios MRN: [id number] Date of Birth: 03-15-1966

## 2020-11-02 ENCOUNTER — Ambulatory Visit: Payer: PRIVATE HEALTH INSURANCE | Attending: Orthopedic Surgery | Admitting: Physical Therapy

## 2020-11-02 ENCOUNTER — Encounter: Payer: Self-pay | Admitting: Physical Therapy

## 2020-11-02 ENCOUNTER — Other Ambulatory Visit: Payer: Self-pay

## 2020-11-02 DIAGNOSIS — M6281 Muscle weakness (generalized): Secondary | ICD-10-CM | POA: Diagnosis present

## 2020-11-02 DIAGNOSIS — R600 Localized edema: Secondary | ICD-10-CM | POA: Diagnosis present

## 2020-11-02 DIAGNOSIS — M25562 Pain in left knee: Secondary | ICD-10-CM | POA: Diagnosis present

## 2020-11-02 DIAGNOSIS — G8929 Other chronic pain: Secondary | ICD-10-CM | POA: Diagnosis present

## 2020-11-02 NOTE — Therapy (Signed)
[place], [name], [address] Phone: [phone]   Fax:  [phone]  Physical Therapy Treatment  Patient Details  Name: Heather Rios MRN: [id number] Date of Birth: [birth year] Referring Provider (PT): Dorna Leitz, MD (for knee)   Encounter Date: 11/02/2020   PT End of Session - 11/02/20 1016     Visit Number 9   5th WC visit   Number of Visits [place] - Visit Number 5    Authorization - Number of Visits 9    Progress Note Due on Visit 10    PT Start Time 1016    PT Stop Time [id number]    PT Time Calculation (min) 42 min    Activity Tolerance Patient tolerated treatment well;No increased pain    Behavior During Therapy [facility] for tasks assessed/performed             Past Medical History:  Diagnosis Date   Allergic rhinitis    Anemia    Anginal pain ([place])    Chest Pains 8/16   Eczema    Environmental allergies    Essential hypertension 07/07/2014   GERD (gastroesophageal reflux disease)    Heart murmur    High cholesterol    Hyperlipidemia 07/07/2014   Hypertension    Hypoglycemia    Hypoglycemia    Plantar fasciitis    S/P TAH (total abdominal hysterectomy) 03/10/2015   Sinusitis, chronic    Vasovagal syncope 07/07/2014    Past Surgical History:  Procedure Laterality Date   ABDOMINAL HYSTERECTOMY N/A 03/10/2015   Procedure: HYSTERECTOMY ABDOMINAL;  Surgeon: Thurnell Lose, MD;  Location: [facility];  Service: Gynecology;  Laterality: N/A;   BILATERAL SALPINGECTOMY Bilateral 03/10/2015   pt denies   CHOLECYSTECTOMY     FOOT SURGERY      There were no vitals filed for this visit.   Subjective Assessment - 11/02/20 1020     Subjective " I am doing pretty good, but noticed the swelling continues to go up and down. I tried using the sleeve they gave me, its still swelling."    Currently in Pain? Yes    Pain Score 0-No pain    Pain  Orientation Left    Aggravating Factors  unsure               [facility] Adult PT Treatment/Exercise:  Therapeutic Exercise: Elliptical L1 x 5 min Ramp L1 Knee extension marching 2 x 15 # Knee curl machine 2 x 15 15# Leg press 2 x 15 55# Heel raise with from slant board 2 x 20    Neuromuscular re-ed: Rhomberg balance on  2 x 30 sec with EO, 2 x 30 sec with EC Tanedem stance 4 x 30 , alternating lead leg increased postureal sway Gait training heel strike/ toe off 1 x 100 ft with demonstration for form                            PT Short Term Goals - 10/08/20 1127       PT SHORT TERM GOAL #1   Title pt to be IND with initial HEP    Time 3    Period Weeks    Status New    Target Date 10/29/20               PT Long Term Goals - 10/08/20  [place] #1   Title She will be independnet with all HEP issued for pain management    Status Unable to assess      PT LONG TERM GOAL #2   Title Pt will report no pain with end range shoulder motions    Status Unable to assess      PT LONG TERM GOAL #3   Title Pt will have improved bilat grip strength to at least 35# for improved UE function    Status Unable to assess      PT LONG TERM GOAL #4   Title Patient will perfrom work tasks without increased pain    Status Unable to assess      PT LONG TERM GOAL #5   Title increase LLE strength to >/= 4+/5 to promote knee stability    Baseline -    Period Weeks    Status New    Target Date 12/03/20      Additional Long Term Goals   Additional Long Term Goals Yes      PT LONG TERM GOAL #6   Title pt to be able to walk/ stand for >/= 1 hour with </= 2/10 max pain for functional endurance required for ALDs and work related endurance    Time 6    Period Weeks    Status New    Target Date 11/19/20      PT LONG TERM GOAL #7   Title increase FOTO score to >/=57% to demo improvement in function    Time 6    Period Weeks    Status New     Target Date 11/19/20      PT LONG TERM GOAL #8   Title pt to be IND with all HEP and is able to maintain her current LOF regarding her LLE and will be discharged from PT.    Time 6    Period Weeks    Status New    Target Date 11/19/20                   Plan - 11/02/20 1025     Clinical Impression Statement Mrs Scallion continues to make good progress with physical therapy reportingn on pain inthe L knee today but does report continued knee/ ankle that can vary in severity. continued focus on hip/ knee strengthening gradually increasing loading which she did well with .    PT Treatment/Interventions Ultrasound;Moist Heat;Electrical Stimulation;Functional mobility training;Neuromuscular re-education;Therapeutic exercise;Therapeutic activities;Patient/family education;Manual techniques;Passive range of motion;Dry needling;Taping;ADLs/Self Care Home Management;Cryotherapy;Iontophoresis '4mg'$ /ml Dexamethasone;Gait training;Stair training;Balance training;Vasopneumatic Device    PT Next Visit Plan hold off on neck/ back, review HEP for the L knee, gross hip/ knee strengthening, balance training, quad  stretching. Incorporate more closed chain exs as tolerted.    PT Home Exercise Plan Access Code: [email or web address] for neck,   for knee [email or web address]    Consulted and Agree with Plan of Care Patient             Patient will benefit from skilled therapeutic intervention in order to improve the following deficits and impairments:  Pain, Decreased activity tolerance, Decreased strength, Increased muscle spasms, Postural dysfunction, Hypomobility, Impaired flexibility, Impaired UE functional use, Improper body mechanics, Increased edema, Decreased balance, Decreased endurance, Abnormal gait  Visit Diagnosis: Localized edema  Chronic pain of left knee  Muscle weakness (generalized)     Problem List Patient Active Problem List   Diagnosis Date Noted  S/P TAH (total abdominal hysterectomy)  03/10/2015   Vasovagal syncope 07/07/2014   Essential hypertension 07/07/2014   Hyperlipidemia 07/07/2014    Starr Lake PT, DPT, LAT, ATC  11/02/20  10:59 AM     [facility] [address] [place], [name], [address] Phone: [phone]   Fax:  [phone]  Name: Heather Rios MRN: [id number] Date of Birth: 1965/09/18

## 2020-11-04 ENCOUNTER — Other Ambulatory Visit: Payer: Self-pay

## 2020-11-04 ENCOUNTER — Ambulatory Visit: Payer: PRIVATE HEALTH INSURANCE

## 2020-11-04 DIAGNOSIS — R600 Localized edema: Secondary | ICD-10-CM | POA: Diagnosis not present

## 2020-11-04 DIAGNOSIS — G8929 Other chronic pain: Secondary | ICD-10-CM

## 2020-11-04 DIAGNOSIS — M6281 Muscle weakness (generalized): Secondary | ICD-10-CM

## 2020-11-04 NOTE — Therapy (Signed)
[place], [name], [address] Phone: [phone]   Fax:  [phone]  Physical Therapy Treatment/Progress Note Patient Details  Name: Heather Rios MRN: [id number] Date of Birth: 1966/01/24 Referring Provider (PT): Dorna Leitz, MD (for knee)  Progress Note Reporting Period 08/22/20 to 11/04/20  See note below for Objective Data and Assessment of Progress/Goals.      Encounter Date: 11/04/2020   PT End of Session - 11/04/20 1102     Visit Number 10    Number of Visits 14    Date for PT Re-Evaluation 12/03/20    Authorization Type UMR - [place]    Authorization - Visit Number 6    Authorization - Number of Visits 9    Progress Note Due on Visit 10    PT Start Time 1102    PT Stop Time 1145    PT Time Calculation (min) 43 min    Activity Tolerance Patient tolerated treatment well;No increased pain    Behavior During Therapy [facility] for tasks assessed/performed             Past Medical History:  Diagnosis Date   Allergic rhinitis    Anemia    Anginal pain ([place])    Chest Pains 8/16   Eczema    Environmental allergies    Essential hypertension 07/07/2014   GERD (gastroesophageal reflux disease)    Heart murmur    High cholesterol    Hyperlipidemia 07/07/2014   Hypertension    Hypoglycemia    Hypoglycemia    Plantar fasciitis    S/P TAH (total abdominal hysterectomy) 03/10/2015   Sinusitis, chronic    Vasovagal syncope 07/07/2014    Past Surgical History:  Procedure Laterality Date   ABDOMINAL HYSTERECTOMY N/A 03/10/2015   Procedure: HYSTERECTOMY ABDOMINAL;  Surgeon: Thurnell Lose, MD;  Location: [facility];  Service: Gynecology;  Laterality: N/A;   BILATERAL SALPINGECTOMY Bilateral 03/10/2015   pt denies   CHOLECYSTECTOMY     FOOT SURGERY      There were no vitals filed for this visit.   Subjective Assessment - 11/04/20 1113     Subjective Pt reports she is doing well with no L  knee pain, but still feel the swelling. Pt reports she has been trying to consciously walking with a heel/toe pattern with the L LE.    Diagnostic tests 09/29/2020 MRI IMPRESSION:  1. 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  2. Intrasubstance degeneration of the medial and lateral menisci  without discrete tear.  3. Mild edema within the superolateral aspect of Hoffa's fat pad,  which can be seen in the setting of patellar tendon-lateral femoral  condyle friction syndrome.    Patient Stated Goals strengthening    Currently in Pain? No/denies    Pain Score 0-No pain    Pain Location Knee    Pain Orientation Left    Pain Descriptors / Indicators Aching    Pain Type Chronic pain    Pain Onset More than a month ago    Pain Frequency Intermittent                               [facility] Adult PT Treatment/Exercise - 11/04/20 0001       Knee/Hip Exercises: Aerobic   Elliptical 5 mins, L1 ramp, 2.5 mins in both directions      Knee/Hip Exercises: Machines for Strengthening   Cybex  Knee Extension B, 15x2, 15 lbs    Cybex Knee Flexion B, 15x2, 15 lbs    Total Gym Leg Press B, 15x2, 55 lbs      Knee/Hip Exercises: Standing   Lateral Step Up Left;2 sets;10 reps;Step Height: 4"    Forward Step Up Left;2 sets;10 reps;Step Height: 4"    SLS with Vectors L stance, 3 way touches 10x2                      PT Short Term Goals - 10/08/20 1127       PT SHORT TERM GOAL #1   Title pt to be IND with initial HEP    Time 3    Period Weeks    Status New    Target Date 10/29/20               PT Long Term Goals - 11/04/20 1312       PT LONG TERM GOAL #5   Title increase LLE strength to >/= 4+/5 to promote knee stability. L knee flexion 5/5, Ext 5/5 with pt reporting preceived min weakness in comparison to the R.    Status Partially Met                   Plan - 11/04/20 1317     Clinical Impression Statement Pt continues  to make appropriate progress with PT. MMT of rthe L knee revealed the hamstrings at 5/5 and the quads tested 5/5 with pt noting her preception as the L not being quite as strong as the R. Overall these strength measures are improved. CKC strength for forward and lateral step ups, 4 inches,  were started today with pt tolerating well. Pt reports she is consciously walking with a heel/toe gait pattern. Pt will continue to benefit from PT to improve pt's L knee/LE strength and functional use.    Personal Factors and Comorbidities Comorbidity 1;Profession    Comorbidities hx of anemia,    Examination-Activity Limitations Stand;Locomotion Level    Examination-Participation Restrictions Cleaning;Occupation    Stability/Clinical Decision Making Evolving/Moderate complexity    Clinical Decision Making Low    Rehab Potential Excellent    PT Frequency 2x / week    PT Duration 6 weeks    PT Treatment/Interventions Ultrasound;Moist Heat;Electrical Stimulation;Functional mobility training;Neuromuscular re-education;Therapeutic exercise;Therapeutic activities;Patient/family education;Manual techniques;Passive range of motion;Dry needling;Taping;ADLs/Self Care Home Management;Cryotherapy;Iontophoresis 78m/ml Dexamethasone;Gait training;Stair training;Balance training;Vasopneumatic Device    PT Next Visit Plan hold off on neck/ back, review HEP for the L knee, gross hip/ knee strengthening, balance training, quad  stretching. Incorporate more closed chain exs as tolerted.    PT Home Exercise Plan Access Code: [email or web address] neck,   for knee [email or web address]    Consulted and Agree with Plan of Care Patient             Patient will benefit from skilled therapeutic intervention in order to improve the following deficits and impairments:  Pain, Decreased activity tolerance, Decreased strength, Increased muscle spasms, Postural dysfunction, Hypomobility, Impaired flexibility, Impaired UE functional use, Improper body  mechanics, Increased edema, Decreased balance, Decreased endurance, Abnormal gait  Visit Diagnosis: Localized edema  Chronic pain of left knee  Muscle weakness (generalized)     Problem List Patient Active Problem List   Diagnosis Date Noted   S/P TAH (total abdominal hysterectomy) 03/10/2015   Vasovagal syncope 07/07/2014   Essential hypertension 07/07/2014   Hyperlipidemia 07/07/2014    AGar PontoMS, PT 11/04/20  1:26 PM   [place], [name], [address] Phone: [phone]   Fax:  [phone]  Name: JARIANA SHUMARD MRN: [id number] Date of Birth: 07/08/1965

## 2020-11-09 ENCOUNTER — Other Ambulatory Visit: Payer: Self-pay

## 2020-11-09 ENCOUNTER — Encounter: Payer: Self-pay | Admitting: Physical Therapy

## 2020-11-09 ENCOUNTER — Ambulatory Visit: Payer: PRIVATE HEALTH INSURANCE | Admitting: Physical Therapy

## 2020-11-09 DIAGNOSIS — M6281 Muscle weakness (generalized): Secondary | ICD-10-CM

## 2020-11-09 DIAGNOSIS — R600 Localized edema: Secondary | ICD-10-CM | POA: Diagnosis not present

## 2020-11-09 DIAGNOSIS — G8929 Other chronic pain: Secondary | ICD-10-CM

## 2020-11-09 NOTE — Therapy (Signed)
[place], [name], [address] Phone: [phone]   Fax:  [phone]  Physical Therapy Treatment  Patient Details  Name: Heather Rios MRN: [id number] Date of Birth: 04/07/1965 Referring Provider (PT): Dorna Leitz, MD (for knee)   Encounter Date: 11/09/2020   PT End of Session - 11/09/20 1019     Visit Number 11    Number of Visits 14    Date for PT Re-Evaluation 12/03/20    Authorization Type UMR - [place] [employer]    Authorization - Visit Number 7    Authorization - Number of Visits 9             Past Medical History:  Diagnosis Date   Allergic rhinitis    Anemia    Anginal pain ([place])    Chest Pains 8/16   Eczema    Environmental allergies    Essential hypertension 07/07/2014   GERD (gastroesophageal reflux disease)    Heart murmur    High cholesterol    Hyperlipidemia 07/07/2014   Hypertension    Hypoglycemia    Hypoglycemia    Plantar fasciitis    S/P TAH (total abdominal hysterectomy) 03/10/2015   Sinusitis, chronic    Vasovagal syncope 07/07/2014    Past Surgical History:  Procedure Laterality Date   ABDOMINAL HYSTERECTOMY N/A 03/10/2015   Procedure: HYSTERECTOMY ABDOMINAL;  Surgeon: Thurnell Lose, MD;  Location: [facility];  Service: Gynecology;  Laterality: N/A;   BILATERAL SALPINGECTOMY Bilateral 03/10/2015   pt denies   CHOLECYSTECTOMY     FOOT SURGERY      There were no vitals filed for this visit.   Subjective Assessment - 11/09/20 1020     Subjective "I am doing pretty good with the knee but I do still get some swelling. My back is bothering more today, I think it was from pulling some racks at work."    Diagnostic tests 09/29/2020 MRI IMPRESSION:  1. 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  2. Intrasubstance degeneration of the medial and lateral menisci  without discrete tear.  3. Mild edema within the superolateral aspect of Hoffa's  fat pad,  which can be seen in the setting of patellar tendon-lateral femoral  condyle friction syndrome.    Currently in Pain? Yes    Pain Score 0-No pain    Pain Orientation Left    Aggravating Factors  unsure.                [facility] PT Assessment - 11/09/20 0001       Assessment   Medical Diagnosis L knee    Referring Provider (PT) Dorna Leitz, MD (for knee)      AROM   Left Knee Flexion 136                Therapeutic Exercise: Elliptical L3 x 5 min Ramp L2 Heel raise with from slant board 2 x 20  Knee extension machine  2 x 11 15# con bil/ ecc LLE Knee curl machine 2 x 10 15# con bil/ ecc LLE Forward step up on to bosu 2 x 10 Lateral step ups on to bosu 2 x 10  Forward lunge touching trail knee to bosu 1 x 10 bil                    PT Education - 11/09/20 1058     Education Details reviewed and updated HEP today    Person(s)  Educated Patient    Methods Explanation;Verbal cues    Comprehension Verbalized understanding;Verbal cues required              PT Short Term Goals - 10/08/20 1127       PT SHORT TERM GOAL #1   Title pt to be IND with initial HEP    Time 3    Period Weeks    Status New    Target Date 10/29/20               PT Long Term Goals - 11/04/20 1312       PT LONG TERM GOAL #5   Title increase LLE strength to >/= 4+/5 to promote knee stability. L knee flexion 5/5, Ext 5/5 with pt reporting preceived min weakness in comparison to the R.    Status Partially Met                   Plan - 11/09/20 1059     Clinical Impression Statement pt continues to arrive to session with no report of pain but continued swelling. she is increasing her knee ROM increasing flexion to 136 today. continued working on strengthening progressing from double limb loading to single limb. updated HEP today to include mini lunge which she did well with. end of session she noted no pain or issues following session.    PT  Treatment/Interventions Ultrasound;Moist Heat;Electrical Stimulation;Functional mobility training;Neuromuscular re-education;Therapeutic exercise;Therapeutic activities;Patient/family education;Manual techniques;Passive range of motion;Dry needling;Taping;ADLs/Self Care Home Management;Cryotherapy;Iontophoresis 24m/ml Dexamethasone;Gait training;Stair training;Balance training;Vasopneumatic Device    PT Next Visit Plan hold off on neck/ back, review HEP for the L knee, gross hip/ knee strengthening, balance training, quad  stretching. Incorporate more closed chain exs as tolerted.    PT Home Exercise Plan Access Code: [email or web address] neck,   for knee [email or web address]    Consulted and Agree with Plan of Care Patient             Patient will benefit from skilled therapeutic intervention in order to improve the following deficits and impairments:  Pain, Decreased activity tolerance, Decreased strength, Increased muscle spasms, Postural dysfunction, Hypomobility, Impaired flexibility, Impaired UE functional use, Improper body mechanics, Increased edema, Decreased balance, Decreased endurance, Abnormal gait  Visit Diagnosis: Localized edema  Chronic pain of left knee  Muscle weakness (generalized)     Problem List Patient Active Problem List   Diagnosis Date Noted   S/P TAH (total abdominal hysterectomy) 03/10/2015   Vasovagal syncope 07/07/2014   Essential hypertension 07/07/2014   Hyperlipidemia 07/07/2014   KStarr LakePT, DPT, LAT, ATC  11/09/20  11:01 AM      [facility] [place] [address]Phone: [phone]  Fax:  [phone] Name: Heather WASKEYMRN: [id number] of Birth: 725-Jun-1967

## 2020-11-11 ENCOUNTER — Ambulatory Visit: Payer: PRIVATE HEALTH INSURANCE | Attending: Orthopedic Surgery

## 2020-11-11 ENCOUNTER — Other Ambulatory Visit: Payer: Self-pay

## 2020-11-11 DIAGNOSIS — G8929 Other chronic pain: Secondary | ICD-10-CM | POA: Insufficient documentation

## 2020-11-11 DIAGNOSIS — R600 Localized edema: Secondary | ICD-10-CM | POA: Diagnosis not present

## 2020-11-11 DIAGNOSIS — M25562 Pain in left knee: Secondary | ICD-10-CM | POA: Insufficient documentation

## 2020-11-11 DIAGNOSIS — M6281 Muscle weakness (generalized): Secondary | ICD-10-CM | POA: Diagnosis present

## 2020-11-11 NOTE — Therapy (Addendum)
[place], [name], [address] Phone: [phone]   Fax:  [phone]  Physical Therapy Treatment / Discharge  Patient Details  Name: Heather Rios MRN: [id number] Date of Birth: 08/31/1965 Referring Provider (PT): Dorna Leitz, MD (for knee)   Encounter Date: 11/11/2020   PT End of Session - 11/11/20 1020     Visit Number 12    Number of Visits 14    Date for PT Re-Evaluation 12/03/20    Authorization Type [employer] - [place] [employer]    Authorization - Visit Number 8    Authorization - Number of Visits 9    Progress Note Due on Visit 10    PT Start Time 1019    PT Stop Time 1100    PT Time Calculation (min) 41 min    Activity Tolerance Patient tolerated treatment well;No increased pain    Behavior During Therapy [facility] for tasks assessed/performed             Past Medical History:  Diagnosis Date   Allergic rhinitis    Anemia    Anginal pain ([place])    Chest Pains 8/16   Eczema    Environmental allergies    Essential hypertension 07/07/2014   GERD (gastroesophageal reflux disease)    Heart murmur    High cholesterol    Hyperlipidemia 07/07/2014   Hypertension    Hypoglycemia    Hypoglycemia    Plantar fasciitis    S/P TAH (total abdominal hysterectomy) 03/10/2015   Sinusitis, chronic    Vasovagal syncope 07/07/2014    Past Surgical History:  Procedure Laterality Date   ABDOMINAL HYSTERECTOMY N/A 03/10/2015   Procedure: HYSTERECTOMY ABDOMINAL;  Surgeon: Thurnell Lose, MD;  Location: [facility];  Service: Gynecology;  Laterality: N/A;   BILATERAL SALPINGECTOMY Bilateral 03/10/2015   pt denies   CHOLECYSTECTOMY     FOOT SURGERY      There were no vitals filed for this visit.   Subjective Assessment - 11/11/20 1028     Subjective I had alot of work last night with lifting and bending and my L knee feels more sore and tight today.    Diagnostic tests 09/29/2020 MRI IMPRESSION:  1.  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  2. Intrasubstance degeneration of the medial and lateral menisci  without discrete tear.  3. Mild edema within the superolateral aspect of Hoffa's fat pad,  which can be seen in the setting of patellar tendon-lateral femoral  condyle friction syndrome.    Patient Stated Goals strengthening    Currently in Pain? Yes    Pain Score 6     Pain Location Knee    Pain Orientation Left    Pain Descriptors / Indicators Tightness;Sore    Pain Type Chronic pain    Pain Onset More than a month ago    Pain Frequency Intermittent    Pain Relieving Factors Up on my feet more                               [facility] Adult PT Treatment/Exercise - 11/11/20 0001       Knee/Hip Exercises: Aerobic   Elliptical 5 mins, L3 ramp, 2.5 mins in both directions      Knee/Hip Exercises: Machines for Strengthening   Cybex Knee Extension B, 10x2, 15 lbs    Cybex Knee Flexion B, 10x2, 15 lbs  Knee/Hip Exercises: Standing   Forward Lunges 2 sets;10 reps    Lateral Step Up Left;2 sets;10 reps;Step Height: 4"   Bosu ball   Forward Step Up Left;2 sets;10 reps   Bosu ball                   PT Education - 11/11/20 1110     Education Details RICE for management of swelling and soreness    Person(s) Educated Patient    Methods Explanation    Comprehension Verbalized understanding              PT Short Term Goals - 10/08/20 1127       PT SHORT TERM GOAL #1   Title pt to be IND with initial HEP    Time 3    Period Weeks    Status New    Target Date 10/29/20               PT Long Term Goals - 11/04/20 1312       PT LONG TERM GOAL #5   Title increase LLE strength to >/= 4+/5 to promote knee stability. L knee flexion 5/5, Ext 5/5 with pt reporting preceived min weakness in comparison to the R.    Status Partially Met                   Plan - 11/11/20 1112     Clinical Impression  Statement Pt presents to PT following work where she reports more tightness and soreness of her L lateral knee due to busier day with lifting and bending. Observation and palpation revealed, slight to minimal increase with L lateral knee swelling. Following a PT session completed for L knee/LE strengthening with both open and closed kinetic chain exs, pt reported her L knee felt less tight. Pt continues to tolerate progression of strengthening exs with increased single leg loading. A cold pack was offered after the session, but pt was not able to do so because of needing to meet and help her son. Reviewed RICE for symptom management, and pt stated she would do so when she returned to her home.    Personal Factors and Comorbidities Comorbidity 1;Profession    Comorbidities hx of anemia,    Examination-Activity Limitations Stand;Locomotion Level    Examination-Participation Restrictions Cleaning;Occupation    Clinical Decision Making Low    PT Treatment/Interventions Ultrasound;Moist Heat;Electrical Stimulation;Functional mobility training;Neuromuscular re-education;Therapeutic exercise;Therapeutic activities;Patient/family education;Manual techniques;Passive range of motion;Dry needling;Taping;ADLs/Self Care Home Management;Cryotherapy;Iontophoresis 55m/ml Dexamethasone;Gait training;Stair training;Balance training;Vasopneumatic Device    PT Next Visit Plan hold off on neck/ back, review HEP for the L knee, gross hip/ knee strengthening, balance training, quad  stretching. Incorporate more closed chain exs as tolerted.    PT Home Exercise Plan Access Code: [email or web address] neck,   for knee [email or web address]    Consulted and Agree with Plan of Care Patient             Patient will benefit from skilled therapeutic intervention in order to improve the following deficits and impairments:  Pain, Decreased activity tolerance, Decreased strength, Increased muscle spasms, Postural dysfunction, Hypomobility, Impaired  flexibility, Impaired UE functional use, Improper body mechanics, Increased edema, Decreased balance, Decreased endurance, Abnormal gait  Visit Diagnosis: Localized edema  Chronic pain of left knee  Muscle weakness (generalized)     Problem List Patient Active Problem List   Diagnosis Date Noted   S/P TAH (total abdominal hysterectomy) 03/10/2015   Vasovagal syncope 07/07/2014  Essential hypertension 07/07/2014   Hyperlipidemia 07/07/2014   Gar Ponto MS, PT 11/11/20 11:31 AM   [facility] [address] [place], [name], [address] Phone: [phone]   Fax:  [phone]  Name: Heather Rios MRN: [id number] Date of Birth: Jan 10, 1966        PHYSICAL THERAPY DISCHARGE SUMMARY  Visits from Start of Care: 12 4 visits for her neck and 8 for her knee via workers comp   Current functional level related to goals / functional outcomes: See goals    Remaining deficits: As of last attended visit she was doing quite well regarding the function of her knee with limited to no pain. Additionally, her neck had progressed nicely as of her last attended visit.   Education / Equipment: HEP, theraband posture, lifting mechanics   Patient agrees to discharge. Patient goals were partially met. Patient is being discharged due to being pleased with the current functional level.    Kristoffer Leamon PT, DPT, LAT, ATC  01/08/21  8:29 PM

## 2020-11-16 ENCOUNTER — Other Ambulatory Visit (HOSPITAL_COMMUNITY): Payer: Self-pay

## 2020-11-16 MED ORDER — DEXLANSOPRAZOLE 60 MG PO CPDR
DELAYED_RELEASE_CAPSULE | ORAL | 1 refills | Status: DC
  Filled 2020-11-16 – 2020-12-02 (×2): qty 90, 90d supply, fill #0

## 2020-11-17 ENCOUNTER — Other Ambulatory Visit (HOSPITAL_COMMUNITY): Payer: Self-pay

## 2020-11-22 ENCOUNTER — Other Ambulatory Visit (HOSPITAL_COMMUNITY): Payer: Self-pay

## 2020-11-22 DIAGNOSIS — J0100 Acute maxillary sinusitis, unspecified: Secondary | ICD-10-CM | POA: Diagnosis not present

## 2020-11-22 MED ORDER — CEFDINIR 300 MG PO CAPS
ORAL_CAPSULE | ORAL | 0 refills | Status: DC
  Filled 2020-11-22: qty 20, 10d supply, fill #0

## 2020-11-25 ENCOUNTER — Other Ambulatory Visit (HOSPITAL_COMMUNITY): Payer: Self-pay

## 2020-11-25 MED ORDER — FLUCONAZOLE 150 MG PO TABS
ORAL_TABLET | ORAL | 0 refills | Status: DC
  Filled 2020-11-25: qty 2, 7d supply, fill #0

## 2020-12-02 ENCOUNTER — Other Ambulatory Visit (HOSPITAL_COMMUNITY): Payer: Self-pay

## 2020-12-13 ENCOUNTER — Other Ambulatory Visit (HOSPITAL_COMMUNITY): Payer: Self-pay

## 2020-12-13 DIAGNOSIS — B002 Herpesviral gingivostomatitis and pharyngotonsillitis: Secondary | ICD-10-CM | POA: Diagnosis not present

## 2020-12-13 DIAGNOSIS — K219 Gastro-esophageal reflux disease without esophagitis: Secondary | ICD-10-CM | POA: Diagnosis not present

## 2020-12-13 DIAGNOSIS — M545 Low back pain, unspecified: Secondary | ICD-10-CM | POA: Diagnosis not present

## 2020-12-13 DIAGNOSIS — I10 Essential (primary) hypertension: Secondary | ICD-10-CM | POA: Diagnosis not present

## 2020-12-13 DIAGNOSIS — Z8659 Personal history of other mental and behavioral disorders: Secondary | ICD-10-CM | POA: Diagnosis not present

## 2020-12-13 DIAGNOSIS — E7800 Pure hypercholesterolemia, unspecified: Secondary | ICD-10-CM | POA: Diagnosis not present

## 2020-12-13 DIAGNOSIS — Z0000 Encounter for general adult medical examination without abnormal findings: Secondary | ICD-10-CM | POA: Diagnosis not present

## 2020-12-13 DIAGNOSIS — L309 Dermatitis, unspecified: Secondary | ICD-10-CM | POA: Diagnosis not present

## 2020-12-13 DIAGNOSIS — J309 Allergic rhinitis, unspecified: Secondary | ICD-10-CM | POA: Diagnosis not present

## 2020-12-13 MED ORDER — AZELASTINE HCL 0.1 % NA SOLN
NASAL | 12 refills | Status: AC
  Filled 2020-12-13: qty 30, 25d supply, fill #0
  Filled 2021-03-06: qty 30, 25d supply, fill #1

## 2020-12-13 MED ORDER — IRBESARTAN 300 MG PO TABS
ORAL_TABLET | ORAL | 1 refills | Status: DC
  Filled 2020-12-13: qty 90, 90d supply, fill #0
  Filled 2021-03-16: qty 90, 90d supply, fill #1

## 2020-12-13 MED ORDER — PRAVASTATIN SODIUM 40 MG PO TABS
ORAL_TABLET | ORAL | 3 refills | Status: DC
  Filled 2020-12-13: qty 90, 90d supply, fill #0
  Filled 2021-03-30: qty 90, 90d supply, fill #1
  Filled 2021-06-14: qty 90, 90d supply, fill #2
  Filled 2021-09-26: qty 90, 90d supply, fill #3

## 2020-12-13 MED ORDER — MONTELUKAST SODIUM 10 MG PO TABS
ORAL_TABLET | ORAL | 1 refills | Status: DC
  Filled 2020-12-13: qty 90, 90d supply, fill #0

## 2020-12-13 MED ORDER — TRIAMCINOLONE ACETONIDE 0.1 % EX CREA
TOPICAL_CREAM | CUTANEOUS | 3 refills | Status: AC
  Filled 2020-12-13: qty 80, 30d supply, fill #0

## 2020-12-13 MED ORDER — VALACYCLOVIR HCL 1 G PO TABS
ORAL_TABLET | ORAL | 1 refills | Status: DC
  Filled 2020-12-13: qty 20, 10d supply, fill #0
  Filled 2021-05-23: qty 20, 5d supply, fill #0
  Filled 2021-12-12: qty 20, 5d supply, fill #1

## 2020-12-13 MED ORDER — CYCLOBENZAPRINE HCL 10 MG PO TABS
ORAL_TABLET | ORAL | 1 refills | Status: AC
  Filled 2020-12-13 – 2020-12-28 (×2): qty 30, 30d supply, fill #0

## 2020-12-13 MED ORDER — DEXLANSOPRAZOLE 60 MG PO CPDR
DELAYED_RELEASE_CAPSULE | ORAL | 3 refills | Status: DC
  Filled 2020-12-13 – 2021-03-06 (×2): qty 90, 90d supply, fill #0

## 2020-12-13 MED ORDER — FAMOTIDINE 40 MG PO TABS
ORAL_TABLET | ORAL | 3 refills | Status: DC
  Filled 2020-12-13: qty 90, 90d supply, fill #0

## 2020-12-13 MED ORDER — AMLODIPINE BESYLATE 5 MG PO TABS
ORAL_TABLET | ORAL | 1 refills | Status: DC
  Filled 2020-12-13 – 2021-02-20 (×2): qty 90, 90d supply, fill #0

## 2020-12-13 MED ORDER — DICLOFENAC SODIUM 1 % EX GEL
CUTANEOUS | 1 refills | Status: AC
  Filled 2020-12-13: qty 100, 14d supply, fill #0

## 2020-12-14 ENCOUNTER — Other Ambulatory Visit (HOSPITAL_COMMUNITY): Payer: Self-pay

## 2020-12-16 ENCOUNTER — Other Ambulatory Visit (HOSPITAL_COMMUNITY): Payer: Self-pay

## 2020-12-26 ENCOUNTER — Other Ambulatory Visit (HOSPITAL_COMMUNITY): Payer: Self-pay

## 2020-12-26 MED ORDER — OMRON 3 SERIES BP MONITOR DEVI
0 refills | Status: AC
  Filled 2020-12-26: qty 1, 30d supply, fill #0

## 2020-12-27 ENCOUNTER — Other Ambulatory Visit (HOSPITAL_COMMUNITY): Payer: Self-pay

## 2020-12-28 ENCOUNTER — Other Ambulatory Visit (HOSPITAL_COMMUNITY): Payer: Self-pay

## 2021-02-01 ENCOUNTER — Encounter: Payer: Self-pay | Admitting: Student

## 2021-02-01 ENCOUNTER — Other Ambulatory Visit (HOSPITAL_COMMUNITY): Payer: Self-pay

## 2021-02-01 DIAGNOSIS — R1013 Epigastric pain: Secondary | ICD-10-CM | POA: Diagnosis not present

## 2021-02-01 DIAGNOSIS — R3989 Other symptoms and signs involving the genitourinary system: Secondary | ICD-10-CM | POA: Diagnosis not present

## 2021-02-02 ENCOUNTER — Other Ambulatory Visit (HOSPITAL_COMMUNITY): Payer: Self-pay

## 2021-02-02 MED ORDER — FAMOTIDINE 40 MG PO TABS
ORAL_TABLET | ORAL | 1 refills | Status: DC
  Filled 2021-02-02: qty 60, 30d supply, fill #0
  Filled 2021-03-20: qty 60, 30d supply, fill #1

## 2021-02-03 ENCOUNTER — Other Ambulatory Visit (HOSPITAL_COMMUNITY): Payer: Self-pay

## 2021-02-08 ENCOUNTER — Other Ambulatory Visit: Payer: Self-pay

## 2021-02-08 DIAGNOSIS — N946 Dysmenorrhea, unspecified: Secondary | ICD-10-CM | POA: Insufficient documentation

## 2021-02-08 DIAGNOSIS — Z8659 Personal history of other mental and behavioral disorders: Secondary | ICD-10-CM | POA: Insufficient documentation

## 2021-02-08 DIAGNOSIS — F419 Anxiety disorder, unspecified: Secondary | ICD-10-CM | POA: Insufficient documentation

## 2021-02-08 DIAGNOSIS — M545 Low back pain, unspecified: Secondary | ICD-10-CM | POA: Insufficient documentation

## 2021-02-08 DIAGNOSIS — J860 Pyothorax with fistula: Secondary | ICD-10-CM | POA: Insufficient documentation

## 2021-02-08 DIAGNOSIS — N939 Abnormal uterine and vaginal bleeding, unspecified: Secondary | ICD-10-CM | POA: Insufficient documentation

## 2021-02-08 DIAGNOSIS — G5602 Carpal tunnel syndrome, left upper limb: Secondary | ICD-10-CM | POA: Insufficient documentation

## 2021-02-08 DIAGNOSIS — R011 Cardiac murmur, unspecified: Secondary | ICD-10-CM | POA: Insufficient documentation

## 2021-02-08 DIAGNOSIS — E010 Iodine-deficiency related diffuse (endemic) goiter: Secondary | ICD-10-CM | POA: Insufficient documentation

## 2021-02-08 DIAGNOSIS — R300 Dysuria: Secondary | ICD-10-CM | POA: Insufficient documentation

## 2021-02-08 DIAGNOSIS — Z3049 Encounter for surveillance of other contraceptives: Secondary | ICD-10-CM | POA: Insufficient documentation

## 2021-02-08 DIAGNOSIS — Z90710 Acquired absence of both cervix and uterus: Secondary | ICD-10-CM | POA: Insufficient documentation

## 2021-02-08 DIAGNOSIS — R002 Palpitations: Secondary | ICD-10-CM | POA: Insufficient documentation

## 2021-02-08 DIAGNOSIS — H1010 Acute atopic conjunctivitis, unspecified eye: Secondary | ICD-10-CM | POA: Insufficient documentation

## 2021-02-08 DIAGNOSIS — Z91012 Allergy to eggs: Secondary | ICD-10-CM | POA: Insufficient documentation

## 2021-02-08 DIAGNOSIS — K648 Other hemorrhoids: Secondary | ICD-10-CM | POA: Insufficient documentation

## 2021-02-08 DIAGNOSIS — Z8249 Family history of ischemic heart disease and other diseases of the circulatory system: Secondary | ICD-10-CM | POA: Insufficient documentation

## 2021-02-08 DIAGNOSIS — M5431 Sciatica, right side: Secondary | ICD-10-CM | POA: Insufficient documentation

## 2021-02-08 DIAGNOSIS — K219 Gastro-esophageal reflux disease without esophagitis: Secondary | ICD-10-CM | POA: Insufficient documentation

## 2021-02-08 DIAGNOSIS — T7808XA Anaphylactic reaction due to eggs, initial encounter: Secondary | ICD-10-CM | POA: Insufficient documentation

## 2021-02-08 DIAGNOSIS — R7303 Prediabetes: Secondary | ICD-10-CM | POA: Insufficient documentation

## 2021-02-08 DIAGNOSIS — F4321 Adjustment disorder with depressed mood: Secondary | ICD-10-CM | POA: Insufficient documentation

## 2021-02-08 DIAGNOSIS — J0120 Acute ethmoidal sinusitis, unspecified: Secondary | ICD-10-CM | POA: Insufficient documentation

## 2021-02-08 DIAGNOSIS — N921 Excessive and frequent menstruation with irregular cycle: Secondary | ICD-10-CM | POA: Insufficient documentation

## 2021-02-08 DIAGNOSIS — D259 Leiomyoma of uterus, unspecified: Secondary | ICD-10-CM | POA: Insufficient documentation

## 2021-02-08 DIAGNOSIS — Z780 Asymptomatic menopausal state: Secondary | ICD-10-CM | POA: Insufficient documentation

## 2021-02-08 DIAGNOSIS — L309 Dermatitis, unspecified: Secondary | ICD-10-CM | POA: Insufficient documentation

## 2021-02-08 DIAGNOSIS — E7800 Pure hypercholesterolemia, unspecified: Secondary | ICD-10-CM | POA: Insufficient documentation

## 2021-02-08 DIAGNOSIS — B002 Herpesviral gingivostomatitis and pharyngotonsillitis: Secondary | ICD-10-CM | POA: Insufficient documentation

## 2021-02-08 DIAGNOSIS — J309 Allergic rhinitis, unspecified: Secondary | ICD-10-CM | POA: Insufficient documentation

## 2021-02-08 DIAGNOSIS — M722 Plantar fascial fibromatosis: Secondary | ICD-10-CM | POA: Insufficient documentation

## 2021-02-16 ENCOUNTER — Telehealth: Payer: Self-pay | Admitting: Cardiology

## 2021-02-16 ENCOUNTER — Encounter: Payer: Self-pay | Admitting: Nurse Practitioner

## 2021-02-16 ENCOUNTER — Ambulatory Visit (INDEPENDENT_AMBULATORY_CARE_PROVIDER_SITE_OTHER): Payer: 59 | Admitting: Nurse Practitioner

## 2021-02-16 VITALS — BP 130/80 | HR 62 | Ht 61.75 in | Wt 146.0 lb

## 2021-02-16 DIAGNOSIS — K219 Gastro-esophageal reflux disease without esophagitis: Secondary | ICD-10-CM

## 2021-02-16 NOTE — Progress Notes (Signed)
ASSESSMENT AND PLAN    #55 year old female with chronic GERD having breakthrough symptoms (regurgitation / globus )on PPI plus twice daily famotidine.  --For further evaluation of breakthrough GERD symptoms patient will be scheduled for EGD. The risks and benefits of EGD with possible biopsies were discussed with the patient who agrees to proceed.  Patient only has her son to service care partner and needs to have the procedure done on a Monday. Dr. Silverio Decamp does not have any endoscopy spots open on a Monday in the near future. Procedure scheduled with Dr. Tarri Glenn. .  -- For now continue Dexilant in the a.m. and twice daily famotidine.  The goal will be to eventually get her on the lowest effective dose of reflux medication.  --Anti-reflux measures discussed including avoidance of trigger foods and evening meals / bedtime snacks. If able, elevate head of bed 6-8 inches. If unable to elevate the head of the bed consider a wedge pillow.   Weight reduction / maintaining a healthy BMI) as increased abdominal girth is associated with reflux.   #History of small adenomatous colon polyp.  Due for polyp surveillance colonoscopy February 2023  HISTORY OF PRESENT ILLNESS     Chief Complaint : acid reflux  Heather Rios is a 55 y.o. female with a past medical history significant for hypertension, hyperlipidemia, diverticulosis , small adenomatous colon polyp , GERD, hysterectomy. See PMH below for any additional history.   Heather Rios is referred by PCP for GERD. She had a screening colonoscopy with Dr. Silverio Decamp in 2018 . Cynai gives a longstanding history of GERD, possibly 15 years or longer .  Over the years she has tried a few different medications.  Nexium gave her a headache.  She does not remember what happened with pantoprazole.  Up until several months ago she had done relatively well over last 3-4 years on Dexilant.  Now having breakthrough GERD symptoms manifested as nausea, "vomiting  acid",  globus and regurgitation (especially at night) .  PCP added Pepcid 40 mg at bedtime. Her symptoms improved  but after stopping Pepcid several weeks ago she has had a recurrence of symptoms. PCP has since resumed Pepcid  and increased dose to 40 mg BID.  The nausea / vomiting have resolved but she is still having reflux symptoms.   PREVIOUS GI EVALUATIONS:    2018 screening colonoscopy A 1 mm and a 6 mm polyp were removed.  Ascending colon diverticula.  Recall colonoscopy February 2023  Surgical [P], hepatic flexure, polyp - TUBULAR ADENOMA. - NO HIGH GRADE DYSPLASIA OR MALIGNANCY. 2. Surgical [P], rectum, polyp - HYPERPLASTIC POLYP. - NO DYSPLASIA OR MALIGNANCY  Past Medical History:  Diagnosis Date   Allergic rhinitis    Anemia    Anginal pain ([place])    Chest Pains 8/16   Eczema    Environmental allergies    Essential hypertension 07/07/2014   GERD (gastroesophageal reflux disease)    Heart murmur    High cholesterol    Hyperlipidemia 07/07/2014   Hypertension    Hypoglycemia    Hypoglycemia    Plantar fasciitis    S/P TAH (total abdominal hysterectomy) 03/10/2015   Sinusitis, chronic    Vasovagal syncope 07/07/2014     Past Surgical History:  Procedure Laterality Date   ABDOMINAL HYSTERECTOMY N/A 03/10/2015   Procedure: HYSTERECTOMY ABDOMINAL;  Surgeon: Thurnell Lose, MD;  Location: [facility];  Service: Gynecology;  Laterality: N/A;   BILATERAL SALPINGECTOMY Bilateral 03/10/2015   pt denies  CHOLECYSTECTOMY     FOOT SURGERY     Family History  Problem Relation Age of Onset   Heart failure Mother    Hyperlipidemia Mother    Heart attack Mother 64   Hyperlipidemia Father    Kidney failure Father        kidney transplant receipiant   Diabetes Other    High blood pressure Other    Breast cancer Neg Hx    Colon cancer Neg Hx    Colon polyps Neg Hx    Esophageal cancer Neg Hx    Rectal cancer Neg Hx    Stomach cancer Neg Hx    Social History   Tobacco  Use   Smoking status: Never   Smokeless tobacco: Never  Substance Use Topics   Alcohol use: No   Drug use: No   Current Outpatient Medications  Medication Sig Dispense Refill   amLODipine (NORVASC) 5 MG tablet Take 1 tablet by mouth every morning 90 tablet 1   azelastine (ASTELIN) 0.1 % nasal spray Inhale 1-2 sprays into each nostril twice a day as needed. 30 mL 12   Blood Pressure Monitoring (OMRON 3 SERIES BP MONITOR) DEVI Use as directed 1 each 0   celecoxib (CELEBREX) 200 MG capsule TAKE 1 CAPSULE BY MOUTH ONCE A DAY WITH FOOD AS NEEDED FOR JOINT PAIN 90 capsule 1   cetirizine (ZYRTEC) 10 MG tablet Take 10 mg by mouth at bedtime as needed for allergies.      cyclobenzaprine (FLEXERIL) 10 MG tablet Take 1/2 to 1 tablet by mouth at bedtime, as needed for muscle spasm. 30 tablet 1   dexlansoprazole (DEXILANT) 60 MG capsule Take 1 capsule by mouth once daily. 90 capsule 3   diclofenac Sodium (VOLTAREN) 1 % GEL Apply to affected area(s) as directed as needed. 100 g 1   EPINEPHrine 0.3 mg/0.3 mL IJ SOAJ injection See admin instructions.  0   famotidine (PEPCID) 40 MG tablet Take 1 tablet by mouth 2 times a day 60 tablet 1   Fexofenadine HCl (MUCINEX ALLERGY PO) Take 1,200 mg by mouth.      irbesartan (AVAPRO) 300 MG tablet Take 1 tablet by mouth every morning. 90 tablet 1   levocetirizine (XYZAL) 5 MG tablet Take 1 tablet (5 mg total) by mouth every evening. 30 tablet 11   montelukast (SINGULAIR) 10 MG tablet Take 1 tablet by mouth once a day 90 tablet 1   mupirocin ointment (BACTROBAN) 2 % Apply 1 application topically 2 (two) times daily. 22 g 0   ofloxacin (OCUFLOX) 0.3 % ophthalmic solution   0   Olopatadine HCl 0.2 % SOLN INSTILL 1 DROP INTO EACH EYE ONCE A DAY, AS NEEDED  3   pravastatin (PRAVACHOL) 40 MG tablet Take 1 tablet by mouth once a day 90 tablet 3   triamcinolone cream (KENALOG) 0.1 % Apply to affected area twice a day as needed for rash. 80 g 3   valACYclovir (VALTREX) 1000  MG tablet Take 2 tablets by mouth every 12 hrs for one day, per outbreak of fever blister. 20 tablet 1   zinc gluconate 50 MG tablet Take 50 mg by mouth daily.      Current Facility-Administered Medications  Medication Dose Route Frequency Provider Last Rate Last Admin   0.9 %  sodium chloride infusion  500 mL Intravenous Continuous Nandigam, Kavitha V, MD       betamethasone acetate-betamethasone sodium phosphate (CELESTONE) injection 3 mg  3 mg Intramuscular Once  Edrick Kins, DPM       Allergies  Allergen Reactions   Contrast Media [Iodinated Diagnostic Agents] Anaphylaxis   Adhesive [Tape] Other (See Comments)    Skin irritation   Augmentin [Amoxicillin-Pot Clavulanate] Hives    Has patient had a PCN reaction causing immediate rash, facial/tongue/throat swelling, SOB or lightheadedness with hypotension: No Has patient had a PCN reaction causing severe rash involving mucus membranes or skin necrosis: No Has patient had a PCN reaction that required hospitalization No Has patient had a PCN reaction occurring within the last 10 years: Yes If all of the above answers are "NO", then may proceed with Cephalosporin use.    Codeine     Other reaction(s): Unknown   Hydrochlorothiazide     Other reaction(s): Syncope   Oxycodone-Acetaminophen     Other reaction(s): hallucinate   Peanut Oil     Other reaction(s): Unknown   Eggs Or Egg-Derived Products Hives and Rash   Hydrocodone Bitartrate Er Itching   Latex Itching and Rash   Nexium [Esomeprazole] Other (See Comments)    headahce   Sudafed [Pseudoephedrine Hcl] Palpitations     Review of Systems: Positive for allergy, sinus trouble, swelling difficult.  All other systems reviewed and negative except where noted in HPI.    PHYSICAL EXAM :    Wt Readings from Last 3 Encounters:  02/16/21 146 lb (66.2 kg)  03/31/19 149 lb (67.6 kg)  08/22/17 140 lb 6.4 oz (63.7 kg)    BP 130/80   Pulse 62   Ht 5' 1.75" (1.568 m)   Wt 146  lb (66.2 kg)   LMP 01/10/2015 (Exact Date)   SpO2 97%   BMI 26.92 kg/m  Constitutional:  Generally well appearing female in no acute distress. Psychiatric: Pleasant. Normal mood and affect. Behavior is normal. EENT: Pupils normal.  Conjunctivae are normal. No scleral icterus. Neck supple.  Cardiovascular: Normal rate, regular rhythm. No edema Pulmonary/chest: Effort normal and breath sounds normal. No wheezing, rales or rhonchi. Abdominal: Soft, nondistended, nontender. Bowel sounds active throughout. There are no masses palpable. No hepatomegaly. Neurological: Alert and oriented to person place and time. Skin: Skin is warm and dry. No rashes noted.  Tye Savoy, NP  02/16/2021, 11:27 AM  Cc:  Referring Provider Maury Dus, MD

## 2021-02-16 NOTE — Telephone Encounter (Signed)
Pt is f/u on  results from most recent EKG results from her PCP to Dr. Marlou Porch

## 2021-02-16 NOTE — Telephone Encounter (Signed)
Spoke with pt who is asking about the EKG she had 2 weeks ago at her PCP office.  She reports it was abnormal and was told her cardiologist needed to see it.  Advised pt we have not received any office visit notes, EKG or call from her PCP.  Requested she call their office to request those records be faxed to [place] for review.  Pt states understanding.

## 2021-02-16 NOTE — Patient Instructions (Signed)
If you are age 55 or older, your body mass index should be between 23-30. Your Body mass index is 26.92 kg/m. If this is out of the aforementioned range listed, please consider follow up with your Primary Care Provider.  If you are age 70 or younger, your body mass index should be between 19-25. Your Body mass index is 26.92 kg/m. If this is out of the aformentioned range listed, please consider follow up with your Primary Care Provider.   The [facility] providers would like to encourage you to use [facility] to communicate with providers for non-urgent requests or questions.  Due to long hold times on the telephone, sending your provider a message by [facility] may be faster and more efficient way to get a response. Please allow 48 business hours for a response.  Please remember that this is for non-urgent requests/questions.  PROCEDURES: You have been scheduled for a EGD. Please follow the written instructions given to you at your visit today. If you use inhalers (even only as needed), please bring them with you on the day of your procedure.  It was great seeing you today! Thank you for entrusting me with your care and choosing [facility].  Tye Savoy, NP

## 2021-02-20 ENCOUNTER — Other Ambulatory Visit (HOSPITAL_COMMUNITY): Payer: Self-pay

## 2021-02-20 NOTE — Telephone Encounter (Signed)
Received EKG on pt faxed from [facility].  Will have Dr Marlou Porch to review when he is in the office next.

## 2021-02-20 NOTE — Telephone Encounter (Signed)
° °  Pt is calling back to f/u °

## 2021-02-20 NOTE — Progress Notes (Signed)
Reviewed and agree with management plans. Available to help to expedite timing of the procedure at the patient/family request.   Joelene Millin L. Tarri Glenn, MD, MPH

## 2021-02-20 NOTE — Telephone Encounter (Signed)
Dr. Marlou Porch is rounding in the hospital this week.Marland Kitchen are we able to get someone else to read this for the pt.. its been a few weeks and she is worried as the bottom of results she received in the mail is showing she has an enlarged heart... please advise

## 2021-02-20 NOTE — Telephone Encounter (Signed)
Follow Up:      Patient said she called her PCP's office and they said they have faxed the EKG 3 times already. Patient wants to know if you have received it?

## 2021-02-20 NOTE — Telephone Encounter (Signed)
Spoke with pt and advised Dr Acie Fredrickson has reviewed the EKG received from her PCP office and read it as WNL.  Pt aware and was grateful for the call back and information.

## 2021-02-27 ENCOUNTER — Other Ambulatory Visit: Payer: Self-pay

## 2021-02-27 ENCOUNTER — Encounter: Payer: Self-pay | Admitting: Gastroenterology

## 2021-02-27 ENCOUNTER — Ambulatory Visit (AMBULATORY_SURGERY_CENTER): Payer: 59 | Admitting: Gastroenterology

## 2021-02-27 VITALS — BP 115/71 | HR 56 | Temp 97.7°F | Resp 16 | Ht 61.0 in | Wt 146.0 lb

## 2021-02-27 DIAGNOSIS — K219 Gastro-esophageal reflux disease without esophagitis: Secondary | ICD-10-CM | POA: Diagnosis not present

## 2021-02-27 DIAGNOSIS — K2950 Unspecified chronic gastritis without bleeding: Secondary | ICD-10-CM | POA: Diagnosis not present

## 2021-02-27 DIAGNOSIS — K2970 Gastritis, unspecified, without bleeding: Secondary | ICD-10-CM | POA: Diagnosis not present

## 2021-02-27 DIAGNOSIS — K3189 Other diseases of stomach and duodenum: Secondary | ICD-10-CM | POA: Diagnosis not present

## 2021-02-27 MED ORDER — SODIUM CHLORIDE 0.9 % IV SOLN: 500.0000 mL | Freq: Once | INTRAVENOUS | Status: DC

## 2021-02-27 NOTE — Progress Notes (Signed)
Called to room to assist during endoscopic procedure.  Patient ID and intended procedure confirmed with present staff. Received instructions for my participation in the procedure from the performing physician.  

## 2021-02-27 NOTE — Progress Notes (Signed)
Medical hx reviewed an updated.  VS by CW

## 2021-02-27 NOTE — Progress Notes (Signed)
Referring Provider: Maury Dus, MD Primary Care Physician:  Maury Dus, MD  Reason for Procedure:  Reflux   IMPRESSION:  Reflux with breakthrough symptoms despite treatment Appropriate candidate for monitored anesthesia care  PLAN: EGD in the [place] today   HPI: Heather Rios is a 55 y.o. female presents for upper endoscopy to evaluate reflux with breakthrough symptoms with regurgitation and globus despite PPI plus twice daily famotidine. Please see [facility] note for complete details. Scheduled with me instead of her [facility] MD, Dr. Silverio Decamp, due to schedule availability.     Past Medical History:  Diagnosis Date   Allergic rhinitis    Anemia    Anginal pain ([place])    Chest Pains 8/16   Eczema    Environmental allergies    Essential hypertension 07/07/2014   GERD (gastroesophageal reflux disease)    Heart murmur    High cholesterol    Hyperlipidemia 07/07/2014   Hypertension    Hypoglycemia    Hypoglycemia    Plantar fasciitis    S/P TAH (total abdominal hysterectomy) 03/10/2015   Sinusitis, chronic    Vasovagal syncope 07/07/2014    Past Surgical History:  Procedure Laterality Date   ABDOMINAL HYSTERECTOMY N/A 03/10/2015   Procedure: HYSTERECTOMY ABDOMINAL;  Surgeon: Thurnell Lose, MD;  Location: [facility];  Service: Gynecology;  Laterality: N/A;   BILATERAL SALPINGECTOMY Bilateral 03/10/2015   pt denies   CHOLECYSTECTOMY     FOOT SURGERY      Current Outpatient Medications  Medication Sig Dispense Refill   amLODipine (NORVASC) 5 MG tablet Take 1 tablet by mouth every morning 90 tablet 1   azelastine (ASTELIN) 0.1 % nasal spray Inhale 1-2 sprays into each nostril twice a day as needed. 30 mL 12   Blood Pressure Monitoring (OMRON 3 SERIES BP MONITOR) DEVI Use as directed 1 each 0   celecoxib (CELEBREX) 200 MG capsule TAKE 1 CAPSULE BY MOUTH ONCE A DAY WITH FOOD AS NEEDED FOR JOINT PAIN 90 capsule 1   cetirizine (ZYRTEC) 10 MG tablet Take 10 mg by mouth  at bedtime as needed for allergies.      cyclobenzaprine (FLEXERIL) 10 MG tablet Take 1/2 to 1 tablet by mouth at bedtime, as needed for muscle spasm. 30 tablet 1   dexlansoprazole (DEXILANT) 60 MG capsule Take 1 capsule by mouth once daily. 90 capsule 3   diclofenac Sodium (VOLTAREN) 1 % GEL Apply to affected area(s) as directed as needed. 100 g 1   EPINEPHrine 0.3 mg/0.3 mL IJ SOAJ injection See admin instructions.  0   famotidine (PEPCID) 40 MG tablet Take 1 tablet by mouth 2 times a day 60 tablet 1   Fexofenadine HCl (MUCINEX ALLERGY PO) Take 1,200 mg by mouth.      irbesartan (AVAPRO) 300 MG tablet Take 1 tablet by mouth every morning. 90 tablet 1   levocetirizine (XYZAL) 5 MG tablet Take 1 tablet (5 mg total) by mouth every evening. 30 tablet 11   montelukast (SINGULAIR) 10 MG tablet Take 1 tablet by mouth once a day 90 tablet 1   mupirocin ointment (BACTROBAN) 2 % Apply 1 application topically 2 (two) times daily. 22 g 0   ofloxacin (OCUFLOX) 0.3 % ophthalmic solution   0   Olopatadine HCl 0.2 % SOLN INSTILL 1 DROP INTO EACH EYE ONCE A DAY, AS NEEDED  3   pravastatin (PRAVACHOL) 40 MG tablet Take 1 tablet by mouth once a day 90 tablet 3   triamcinolone cream (  KENALOG) 0.1 % Apply to affected area twice a day as needed for rash. 80 g 3   valACYclovir (VALTREX) 1000 MG tablet Take 2 tablets by mouth every 12 hrs for one day, per outbreak of fever blister. 20 tablet 1   zinc gluconate 50 MG tablet Take 50 mg by mouth daily.      Current Facility-Administered Medications  Medication Dose Route Frequency Provider Last Rate Last Admin   0.9 %  sodium chloride infusion  500 mL Intravenous Continuous Nandigam, Kavitha V, MD       0.9 %  sodium chloride infusion  500 mL Intravenous Once Thornton Park, MD       betamethasone acetate-betamethasone sodium phosphate (CELESTONE) injection 3 mg  3 mg Intramuscular Once Edrick Kins, DPM        Allergies as of 02/27/2021 - Review Complete  02/16/2021  Allergen Reaction Noted   Contrast media [iodinated diagnostic agents] Anaphylaxis 07/07/2014   Adhesive [tape] Other (See Comments) 07/07/2014   Augmentin [amoxicillin-pot clavulanate] Hives 07/07/2014   Codeine  02/01/2021   Hydrochlorothiazide  02/01/2021   Oxycodone-acetaminophen  02/01/2021   Peanut oil  02/01/2021   Eggs or egg-derived products Hives and Rash 02/11/2011   Hydrocodone bitartrate er Itching 07/07/2014   Latex Itching and Rash 08/09/2016   Nexium [esomeprazole] Other (See Comments) 07/07/2014   Sudafed [pseudoephedrine hcl] Palpitations 07/07/2014    Family History  Problem Relation Age of Onset   Heart failure Mother    Hyperlipidemia Mother    Heart attack Mother 67   Hyperlipidemia Father    Kidney failure Father        kidney transplant receipiant   Diabetes Other    High blood pressure Other    Breast cancer Neg Hx    Colon cancer Neg Hx    Colon polyps Neg Hx    Esophageal cancer Neg Hx    Rectal cancer Neg Hx    Stomach cancer Neg Hx      Physical Exam: General:   Alert,  well-nourished, pleasant and cooperative in NAD Head:  Normocephalic and atraumatic. Eyes:  Sclera clear, no icterus.   Conjunctiva pink. Mouth:  No deformity or lesions.   Neck:  Supple; no masses or thyromegaly. Lungs:  Clear throughout to auscultation.   No wheezes. Heart:  Regular rate and rhythm; no murmurs. Abdomen:  Soft, non-tender, nondistended, normal bowel sounds, no rebound or guarding.  Msk:  Symmetrical. No boney deformities LAD: No inguinal or umbilical LAD Extremities:  No clubbing or edema. Neurologic:  Alert and  oriented x4;  grossly nonfocal Skin:  No obvious rash or bruise. Psych:  Alert and cooperative. Normal mood and affect.     Studies/Results: No results found.    Dasie Chancellor L. Tarri Glenn, MD, MPH 02/27/2021, 11:04 AM

## 2021-02-27 NOTE — Progress Notes (Signed)
To PACU, VSS. Report to [email or web address] 

## 2021-02-27 NOTE — Patient Instructions (Addendum)
Resume previous diet and medications. Continue Dexilant 60 mg QAM and Famotidine 40 MG BID. Awaiting pathology results. No aspirin, Ibuprofen, Naproxen, or other non-steroidal anti-inflammatory drugs. Continue reflux lifestyle modifications. Office follow up with Dr. Silverio Decamp or Tye Savoy to review these results.  YOU HAD AN ENDOSCOPIC PROCEDURE TODAY AT [facility]:   Refer to the procedure report that was given to you for any specific questions about what was found during the examination.  If the procedure report does not answer your questions, please call your gastroenterologist to clarify.  If you requested that your care partner not be given the details of your procedure findings, then the procedure report has been included in a sealed envelope for you to review at your convenience later.  YOU SHOULD EXPECT: Some feelings of bloating in the abdomen. Passage of more gas than usual.  Walking can help get rid of the air that was put into your GI tract during the procedure and reduce the bloating. If you had a lower endoscopy (such as a colonoscopy or flexible sigmoidoscopy) you may notice spotting of blood in your stool or on the toilet paper. If you underwent a bowel prep for your procedure, you may not have a normal bowel movement for a few days.  Please Note:  You might notice some irritation and congestion in your nose or some drainage.  This is from the oxygen used during your procedure.  There is no need for concern and it should clear up in a day or so.  SYMPTOMS TO REPORT IMMEDIATELY:  Following upper endoscopy (EGD)  Vomiting of blood or coffee ground material  New chest pain or pain under the shoulder blades  Painful or persistently difficult swallowing  New shortness of breath  Fever of 100F or higher  Black, tarry-looking stools  For urgent or emergent issues, a gastroenterologist can be reached at any hour by calling [phone]. Do not use MyChart  messaging for urgent concerns.    DIET:  We do recommend a small meal at first, but then you may proceed to your regular diet.  Drink plenty of fluids but you should avoid alcoholic beverages for 24 hours.  ACTIVITY:  You should plan to take it easy for the rest of today and you should NOT DRIVE or use heavy machinery until tomorrow (because of the sedation medicines used during the test).    FOLLOW UP: Our staff will call the number listed on your records 48-72 hours following your procedure to check on you and address any questions or concerns that you may have regarding the information given to you following your procedure. If we do not reach you, we will leave a message.  We will attempt to reach you two times.  During this call, we will ask if you have developed any symptoms of COVID 19. If you develop any symptoms (ie: fever, flu-like symptoms, shortness of breath, cough etc.) before then, please call [phone].  If you test positive for Covid 19 in the 2 weeks post procedure, please call and report this information to [place].    If any biopsies were taken you will be contacted by phone or by letter within the next 1-3 weeks.  Please call us at [phone] if you have not heard about the biopsies in 3 weeks.    SIGNATURES/CONFIDENTIALITY: You and/or your care partner have signed paperwork which will be entered into your electronic medical record.  These signatures attest to the fact that that  the information above on your After Visit Summary has been reviewed and is understood.  Full responsibility of the confidentiality of this discharge information lies with you and/or your care-partner.

## 2021-02-27 NOTE — Op Note (Signed)
[facility] Patient Name: Heather Rios Procedure Date: 02/27/2021 11:09 AM MRN: [id number] Endoscopist: Thornton Park MD, MD Age: 55 Referring MD:  Date of Birth: 06-Jan-1966 Gender: Female Account #: [id number] Procedure:                Upper GI endoscopy Indications:              Esophageal reflux symptoms that persist despite PPI                            and H2B therapy Medicines:                Monitored Anesthesia Care Procedure:                Pre-Anesthesia Assessment:                           - Prior to the procedure, a History and Physical                            was performed, and patient medications and                            allergies were reviewed. The patient's tolerance of                            previous anesthesia was also reviewed. The risks                            and benefits of the procedure and the sedation                            options and risks were discussed with the patient.                            All questions were answered, and informed consent                            was obtained. Prior Anticoagulants: The patient has                            taken no previous anticoagulant or antiplatelet                            agents. ASA Grade Assessment: II - A patient with                            mild systemic disease. After reviewing the risks                            and benefits, the patient was deemed in                            satisfactory condition to undergo the procedure.  After obtaining informed consent, the endoscope was                            passed under direct vision. Throughout the                            procedure, the patient's blood pressure, pulse, and                            oxygen saturations were monitored continuously. The                            GIF [id number] #[id number] was introduced through the                            mouth, and advanced to the third  part of duodenum.                            The upper GI endoscopy was accomplished without                            difficulty. The patient tolerated the procedure                            well. Scope In: Scope Out: Findings:                 The examined esophagus was normal except for mild                            congestion in the distal esophagus. The z-line is                            located 36cm from the incisors. Biopsies were taken                            from the mid/proximal and distal esophagus with a                            cold forceps for histology. Estimated blood loss                            was minimal.                           Localized moderately erythematous mucosa without                            bleeding was found in the gastric body in an                            isolated patch. Biopsies were obtained with cold                            forceps for  histology. The remainder of the                            examined gastric mucosa appeared normal. Biopsies                            were obtained from the antrum, body, fundus, and                            incinsura for further evaluation.                           A single umbilicated lesion was found in the                            gastric body. Biopsies were taken with a cold                            forceps for histology. Estimated blood loss was                            minimal.                           The examined duodenum was normal. Complications:            No immediate complications. Estimated Blood Loss:     Estimated blood loss: none. Impression:               - Normal esophagus. Biopsied.                           - Erythematous mucosa in the gastric body.                           - A single lesion suspicious for aberrant pancreas                            was found in the stomach. Biopsied.                           - Normal examined duodenum. Recommendation:            - Patient has a contact number available for                            emergencies. The signs and symptoms of potential                            delayed complications were discussed with the                            patient. Return to normal activities tomorrow.                            Written discharge instructions were provided to the  patient.                           - Resume previous diet.                           - Continue present medications including Dexilant                            60 mg QAM and famotidine 40 mg BID.                           - Await pathology results.                           - No aspirin, ibuprofen, naproxen, or other                            non-steroidal anti-inflammatory drugs.                           - Continue reflux lifestyle modifications.                           - Office follow-up with Dr. Silverio Decamp or Tye Savoy to review these results. Thornton Park MD, MD 02/27/2021 11:53:05 AM This report has been signed electronically.

## 2021-03-01 ENCOUNTER — Telehealth: Payer: Self-pay | Admitting: *Deleted

## 2021-03-01 NOTE — Telephone Encounter (Signed)
°  Follow up Call-  Call back number 02/27/2021  Post procedure Call Back phone  # [phone]  Some recent data might be hidden     Patient questions:  Do you have a fever, pain , or abdominal swelling? No. Pain Score  0 *  Have you tolerated food without any problems? Yes.    Have you been able to return to your normal activities? Yes.    Do you have any questions about your discharge instructions: Diet   No. Medications  No. Follow up visit  No.  Do you have questions or concerns about your Care? No.  Actions: * If pain score is 4 or above: No action needed, pain <4.  Have you developed a fever since your procedure? no  2.   Have you had an respiratory symptoms (SOB or cough) since your procedure? no  3.   Have you tested positive for COVID 19 since your procedure no  4.   Have you had any family members/close contacts diagnosed with the COVID 19 since your procedure?  no   If yes to any of these questions please route to Joylene John, RN and Joella Prince, RN

## 2021-03-01 NOTE — Telephone Encounter (Signed)
First follow up call attempt.  LVM. 

## 2021-03-03 ENCOUNTER — Encounter: Payer: Self-pay | Admitting: Gastroenterology

## 2021-03-06 ENCOUNTER — Other Ambulatory Visit (HOSPITAL_COMMUNITY): Payer: Self-pay

## 2021-03-07 ENCOUNTER — Other Ambulatory Visit (HOSPITAL_COMMUNITY): Payer: Self-pay

## 2021-03-07 DIAGNOSIS — R0981 Nasal congestion: Secondary | ICD-10-CM | POA: Diagnosis not present

## 2021-03-07 MED ORDER — TRIAMCINOLONE ACETONIDE 55 MCG/ACT NA AERO
INHALATION_SPRAY | NASAL | 5 refills | Status: DC
  Filled 2021-03-08: qty 1, 30d supply, fill #0

## 2021-03-08 ENCOUNTER — Other Ambulatory Visit (HOSPITAL_COMMUNITY): Payer: Self-pay

## 2021-03-17 ENCOUNTER — Other Ambulatory Visit (HOSPITAL_COMMUNITY): Payer: Self-pay

## 2021-03-20 ENCOUNTER — Other Ambulatory Visit (HOSPITAL_COMMUNITY): Payer: Self-pay

## 2021-03-25 DIAGNOSIS — H5203 Hypermetropia, bilateral: Secondary | ICD-10-CM | POA: Diagnosis not present

## 2021-03-28 NOTE — Progress Notes (Signed)
Reviewed and agree with documentation and assessment and plan. K. Veena Wasil Wolke , MD   

## 2021-03-30 ENCOUNTER — Other Ambulatory Visit (HOSPITAL_COMMUNITY): Payer: Self-pay

## 2021-04-17 ENCOUNTER — Ambulatory Visit: Payer: 59 | Admitting: Gastroenterology

## 2021-04-17 ENCOUNTER — Encounter: Payer: Self-pay | Admitting: Gastroenterology

## 2021-04-17 ENCOUNTER — Other Ambulatory Visit (HOSPITAL_COMMUNITY): Payer: Self-pay

## 2021-04-17 VITALS — BP 118/66 | HR 76 | Ht 61.0 in | Wt 145.1 lb

## 2021-04-17 DIAGNOSIS — K2970 Gastritis, unspecified, without bleeding: Secondary | ICD-10-CM

## 2021-04-17 DIAGNOSIS — Z86010 Personal history of colonic polyps: Secondary | ICD-10-CM | POA: Diagnosis not present

## 2021-04-17 DIAGNOSIS — K219 Gastro-esophageal reflux disease without esophagitis: Secondary | ICD-10-CM | POA: Diagnosis not present

## 2021-04-17 MED ORDER — SUCRALFATE 1 G PO TABS
1.0000 g | ORAL_TABLET | Freq: Two times a day (BID) | ORAL | 3 refills | Status: DC
  Filled 2021-04-17: qty 60, 30d supply, fill #0
  Filled 2021-10-18: qty 60, 30d supply, fill #1

## 2021-04-17 MED ORDER — DEXLANSOPRAZOLE 60 MG PO CPDR
DELAYED_RELEASE_CAPSULE | ORAL | 3 refills | Status: DC
  Filled 2021-04-17: qty 90, fill #0
  Filled 2021-06-05 – 2021-06-06 (×2): qty 90, 90d supply, fill #0

## 2021-04-17 MED ORDER — FAMOTIDINE 20 MG PO TABS
20.0000 mg | ORAL_TABLET | Freq: Every day | ORAL | 6 refills | Status: DC
  Filled 2021-04-17: qty 30, 30d supply, fill #0

## 2021-04-17 NOTE — Patient Instructions (Signed)
We have sent Dexilant,Carafate and Pepcid to your pharmacy  Follow up in 1 year  If you are age 56 or older, your body mass index should be between 23-30. Your Body mass index is 27.42 kg/m. If this is out of the aforementioned range listed, please consider follow up with your Primary Care Provider.  If you are age 14 or younger, your body mass index should be between 19-25. Your Body mass index is 27.42 kg/m. If this is out of the aformentioned range listed, please consider follow up with your Primary Care Provider.   ________________________________________________________  The [facility] providers would like to encourage you to use [facility] to communicate with providers for non-urgent requests or questions.  Due to long hold times on the telephone, sending your provider a message by [facility] may be a faster and more efficient way to get a response.  Please allow 48 business hours for a response.  Please remember that this is for non-urgent requests.  _______________________________________________________   I appreciate the  opportunity to care for you  Thank You   Harl Bowie , MD

## 2021-04-25 ENCOUNTER — Other Ambulatory Visit (HOSPITAL_COMMUNITY): Payer: Self-pay

## 2021-04-25 DIAGNOSIS — K5792 Diverticulitis of intestine, part unspecified, without perforation or abscess without bleeding: Secondary | ICD-10-CM | POA: Diagnosis not present

## 2021-04-25 DIAGNOSIS — N390 Urinary tract infection, site not specified: Secondary | ICD-10-CM | POA: Diagnosis not present

## 2021-04-25 MED ORDER — METRONIDAZOLE 500 MG PO TABS
ORAL_TABLET | ORAL | 0 refills | Status: DC
  Filled 2021-04-25: qty 20, 10d supply, fill #0

## 2021-04-25 MED ORDER — CIPROFLOXACIN HCL 500 MG PO TABS
ORAL_TABLET | ORAL | 0 refills | Status: DC
  Filled 2021-04-25: qty 20, 10d supply, fill #0

## 2021-04-25 MED ORDER — FLUCONAZOLE 150 MG PO TABS
ORAL_TABLET | ORAL | 0 refills | Status: DC
  Filled 2021-04-25: qty 2, 7d supply, fill #0

## 2021-04-26 ENCOUNTER — Other Ambulatory Visit (HOSPITAL_COMMUNITY): Payer: Self-pay

## 2021-04-28 NOTE — Progress Notes (Signed)
Heather Rios    [id number]    31-Jul-1965  Primary Care Physician:Reade, Herbie [place], MD  Referring Physician: Maury Dus, MD [address] [place],  [place] [address]   Chief complaint: GERD  HPI:  56 year old very pleasant female here for follow-up visit for GERD  Overall she is doing well, denies any breakthrough heartburn.  She is currently on Dexilant and is also using Pepcid twice daily No dysphagia, odynophagia, nausea, vomiting, change in bowel habits, melena or rectal bleeding.  EGD by Dr. Tarri Glenn February 27, 2021 - Normal esophagus. Biopsied. - Erythematous mucosa in the gastric body. - A single lesion suspicious for aberrant pancreas was found in the stomach. Biopsied. - Normal examined duodenum.  Colonoscopy April 19, 2016 - One 6 mm polyp at the hepatic flexure, removed with a cold snare. Resected and retrieved. - One 1 mm polyp in the rectum, removed with a cold biopsy forceps. Resected and retrieved. - Diverticulosis in the ascending colon. - The examination was otherwise normal.  Outpatient Encounter Medications as of 04/17/2021  Medication Sig   amLODipine (NORVASC) 5 MG tablet Take 1 tablet by mouth every morning   azelastine (ASTELIN) 0.1 % nasal spray Inhale 1-2 sprays into each nostril twice a day as needed.   Blood Pressure Monitoring (OMRON 3 SERIES BP MONITOR) DEVI Use as directed   celecoxib (CELEBREX) 200 MG capsule TAKE 1 CAPSULE BY MOUTH ONCE A DAY WITH FOOD AS NEEDED FOR JOINT PAIN   cetirizine (ZYRTEC) 10 MG tablet Take 10 mg by mouth at bedtime as needed for allergies.    cyclobenzaprine (FLEXERIL) 10 MG tablet Take 1/2 to 1 tablet by mouth at bedtime, as needed for muscle spasm.   dexlansoprazole (DEXILANT) 60 MG capsule Take 1 capsule by mouth once daily.   diclofenac Sodium (VOLTAREN) 1 % GEL Apply to affected area(s) as directed as needed.   EPINEPHrine 0.3 mg/0.3 mL IJ SOAJ injection See admin instructions.    famotidine (PEPCID) 40 MG tablet Take 1 tablet by mouth 2 times a day   Fexofenadine HCl (MUCINEX ALLERGY PO) Take 1,200 mg by mouth.    irbesartan (AVAPRO) 300 MG tablet Take 1 tablet by mouth every morning.   levocetirizine (XYZAL) 5 MG tablet Take 1 tablet (5 mg total) by mouth every evening.   montelukast (SINGULAIR) 10 MG tablet Take 1 tablet by mouth once a day   mupirocin ointment (BACTROBAN) 2 % Apply 1 application topically 2 (two) times daily.   ofloxacin (OCUFLOX) 0.3 % ophthalmic solution    Olopatadine HCl 0.2 % SOLN INSTILL 1 DROP INTO EACH EYE ONCE A DAY, AS NEEDED   pravastatin (PRAVACHOL) 40 MG tablet Take 1 tablet by mouth once a day   triamcinolone (NASACORT ALLERGY 24HR) 55 MCG/ACT AERO nasal inhaler Use 1 spray in each nostril twice a day   triamcinolone cream (KENALOG) 0.1 % Apply to affected area twice a day as needed for rash.   valACYclovir (VALTREX) 1000 MG tablet Take 2 tablets by mouth every 12 hrs for one day, per outbreak of fever blister.   zinc gluconate 50 MG tablet Take 50 mg by mouth daily.    Facility-Administered Encounter Medications as of 04/17/2021  Medication   0.9 %  sodium chloride infusion   0.9 %  sodium chloride infusion   betamethasone acetate-betamethasone sodium phosphate (CELESTONE) injection 3 mg    Allergies as of 04/17/2021 - Review Complete 04/17/2021  Allergen Reaction Noted   Contrast media [iodinated contrast media] Anaphylaxis 07/07/2014   Adhesive [tape] Other (See Comments) 07/07/2014   Augmentin [amoxicillin-pot clavulanate] Hives 07/07/2014   Codeine  02/01/2021   Hydrochlorothiazide  02/01/2021   Oxycodone-acetaminophen  02/01/2021   Peanut oil  02/01/2021   Strawberry extract  03/07/2021   Eggs or egg-derived products Hives and Rash 02/11/2011   Hydrocodone bitartrate er Itching 07/07/2014   Latex Itching and Rash 08/09/2016   Nexium [esomeprazole] Other (See Comments) 07/07/2014   Sudafed [pseudoephedrine hcl]  Palpitations 07/07/2014    Past Medical History:  Diagnosis Date   Allergic rhinitis    Anemia    Anginal pain ([place])    Chest Pains 8/16   Eczema    Environmental allergies    Essential hypertension 07/07/2014   GERD (gastroesophageal reflux disease)    Heart murmur    High cholesterol    Hyperlipidemia 07/07/2014   Hypertension    Hypoglycemia    Hypoglycemia    Plantar fasciitis    S/P TAH (total abdominal hysterectomy) 03/10/2015   Sinusitis, chronic    Vasovagal syncope 07/07/2014    Past Surgical History:  Procedure Laterality Date   ABDOMINAL HYSTERECTOMY N/A 03/10/2015   Procedure: HYSTERECTOMY ABDOMINAL;  Surgeon: Thurnell Lose, MD;  Location: [facility];  Service: Gynecology;  Laterality: N/A;   BILATERAL SALPINGECTOMY Bilateral 03/10/2015   pt denies   CHOLECYSTECTOMY     FOOT SURGERY      Family History  Problem Relation Age of Onset   Heart disease Mother    Heart failure Mother    Hyperlipidemia Mother    Heart attack Mother 67   Diabetes Father    Hyperlipidemia Father    Kidney failure Father        kidney transplant receipiant   Diabetes Brother    Diabetes Other    High blood pressure Other    Breast cancer Neg Hx    Colon cancer Neg Hx    Colon polyps Neg Hx    Esophageal cancer Neg Hx    Rectal cancer Neg Hx    Stomach cancer Neg Hx    Pancreatic cancer Neg Hx     Social History   Socioeconomic History   Marital status: Single    Spouse name: Not on file   Number of children: 1   Years of education: Not on file   Highest education level: Not on file  Occupational History   Not on file  Tobacco Use   Smoking status: Never   Smokeless tobacco: Never  Vaping Use   Vaping Use: Never used  Substance and Sexual Activity   Alcohol use: No   Drug use: No   Sexual activity: Yes    Birth control/protection: Implant  Other Topics Concern   Not on file  Social History Narrative   Not on file   Social Determinants of Health    Financial Resource Strain: Not on file  Food Insecurity: Not on file  Transportation Needs: Not on file  Physical Activity: Not on file  Stress: Not on file  Social Connections: Not on file  Intimate Partner Violence: Not on file      Review of systems: All other review of systems negative except as mentioned in the HPI.   Physical Exam: Vitals:   04/17/21 0855  BP: 118/66  Pulse: 76  SpO2: 98%   Body mass index is 27.42 kg/m. Gen:      No acute distress HEENT:  sclera anicteric Abd:      soft, non-tender; no palpable masses, no distension Ext:    No edema Neuro: alert and oriented x 3 Psych: normal mood and affect  Data Reviewed:  Reviewed labs, radiology imaging, old records and pertinent past GI work up   Assessment and Plan/Recommendations:  56 year old very pleasant female here for follow-up visit for chronic GERD  GERD: Continue Dexilant daily.  Advised patient to use Pepcid and Carafate as needed for breakthrough heartburn and avoid using it daily Continue with antireflux measures and dietary modifications  History of adenomatous colon polyps removed in February 2018, due for recall colonoscopy February 2025  Return in 1 year or sooner if needed  This visit required 30 minutes of patient care (this includes precharting, chart review, review of results, face-to-face time used for counseling as well as treatment plan and follow-up. The patient was provided an opportunity to ask questions and all were answered. The patient agreed with the plan and demonstrated an understanding of the instructions.  Damaris Hippo , MD    CC: Maury Dus, MD

## 2021-04-30 NOTE — Progress Notes (Signed)
Cardiology Office Note:    Date:  05/01/2021   ID:  Heather Rios, DOB [birth year], MRN [id number]  PCP:  Maury Dus, MD  [facility] Providers Cardiologist:  Candee Furbish, MD     Referring MD: Maury Dus, MD   Chief Complaint:  F/u for FHx CAD    Patient Profile: FHx CAD Hypertension  Hyperlipidemia  GERD  Hx of syncope in 2016 Workup unremarkable (Echocardiogram, stress test, sleep study normal) Hypokalemia - HCTZ DC'd  Prior CV Studies: Echocardiogram 04/09/19 EF 65-70, mild LVH, no RWMA, GLS -20.6, normal RVSF, trivial pericardial effusion, mild MR, trivial TR, trivial AI, mild AV sclerosis w/o AS, trivial PI    History of Present Illness:   Heather Rios is a 56 y.o. female with the above problem list.  She was last seen by Dr. Marlou Porch in 1/21 for the evaluation of chest pain and a heart murmur.  A f/u echocardiogram showed normal EF, mild MR and AV sclerosis w/o AS.  Review of her chart indicates an EKG was faxed from her PCP for review.  Notes from 02/20/21 indicated the EKG was normal.  I cannot locate a copy of the EKG in her chart.    She returns for f/u.  She is here alone.  She is currently on Cipro and Flagyl for UTI and diverticulitis.  She is still having a lot of symptoms from her diverticulitis but her UTI seems to be clearing up. She is having side effects from Flagyl. She plans to f/u with her PCP today.  From a cardiac perspective, she is doing well without chest pain, shortness of breath, syncope, orthopnea, leg edema.          Past Medical History:  Diagnosis Date   Allergic rhinitis    Anemia    Anginal pain ([place])    Chest Pains 8/16   Eczema    Environmental allergies    Essential hypertension 07/07/2014   GERD (gastroesophageal reflux disease)    Heart murmur    High cholesterol    Hyperlipidemia 07/07/2014   Hypertension    Hypoglycemia    Hypoglycemia    Plantar fasciitis    S/P TAH (total abdominal hysterectomy) 03/10/2015    Sinusitis, chronic    Vasovagal syncope 07/07/2014   Current Medications: Current Meds  Medication Sig   amLODipine (NORVASC) 5 MG tablet Take 1 tablet by mouth every morning   azelastine (ASTELIN) 0.1 % nasal spray Inhale 1-2 sprays into each nostril twice a day as needed.   Blood Pressure Monitoring (OMRON 3 SERIES BP MONITOR) DEVI Use as directed   celecoxib (CELEBREX) 200 MG capsule TAKE 1 CAPSULE BY MOUTH ONCE A DAY WITH FOOD AS NEEDED FOR JOINT PAIN   cetirizine (ZYRTEC) 10 MG tablet Take 10 mg by mouth at bedtime as needed for allergies.    ciprofloxacin (CIPRO) 500 MG tablet Take 1 tablet by mouth every 12 hours for 10 days   cyclobenzaprine (FLEXERIL) 10 MG tablet Take 1/2 to 1 tablet by mouth at bedtime, as needed for muscle spasm.   dexlansoprazole (DEXILANT) 60 MG capsule Take 1 capsule by mouth once daily.   diclofenac Sodium (VOLTAREN) 1 % GEL Apply to affected area(s) as directed as needed.   EPINEPHrine 0.3 mg/0.3 mL IJ SOAJ injection See admin instructions.   famotidine (PEPCID) 20 MG tablet Take 1 tablet (20 mg total) by mouth at bedtime.   Fexofenadine HCl (MUCINEX ALLERGY PO) Take 1,200 mg by mouth.  fluconazole (DIFLUCAN) 150 MG tablet Take 1 tablet by mouth for 1 dose then  repeat in 7 days if needed   irbesartan (AVAPRO) 300 MG tablet Take 1 tablet by mouth every morning.   levocetirizine (XYZAL) 5 MG tablet Take 1 tablet (5 mg total) by mouth every evening.   metroNIDAZOLE (FLAGYL) 500 MG tablet Take 1 tablet by mouth twice daily   montelukast (SINGULAIR) 10 MG tablet Take 1 tablet by mouth once a day   mupirocin ointment (BACTROBAN) 2 % Apply 1 application topically 2 (two) times daily.   ofloxacin (OCUFLOX) 0.3 % ophthalmic solution    Olopatadine HCl 0.2 % SOLN INSTILL 1 DROP INTO EACH EYE ONCE A DAY, AS NEEDED   pravastatin (PRAVACHOL) 40 MG tablet Take 1 tablet by mouth once a day   sucralfate (CARAFATE) 1 g tablet Take 1 tablet (1 g total) by mouth 2 (two)  times daily as needed.   triamcinolone (NASACORT ALLERGY 24HR) 55 MCG/ACT AERO nasal inhaler Use 1 spray in each nostril twice a day   triamcinolone cream (KENALOG) 0.1 % Apply to affected area twice a day as needed for rash.   valACYclovir (VALTREX) 1000 MG tablet Take 2 tablets by mouth every 12 hrs for one day, per outbreak of fever blister.   Current Facility-Administered Medications for the 05/01/21 encounter (Office Visit) with Richardson Dopp T, PA-C  Medication   0.9 %  sodium chloride infusion   0.9 %  sodium chloride infusion   betamethasone acetate-betamethasone sodium phosphate (CELESTONE) injection 3 mg    Allergies:   Contrast media [iodinated contrast media], Adhesive [tape], Amoxicillin-pot clavulanate, Codeine, Hydrochlorothiazide, Oxycodone-acetaminophen, Peanut oil, Strawberry extract, Eggs or egg-derived products, Hydrocodone bitartrate er, Latex, Nexium [esomeprazole], and Sudafed [pseudoephedrine hcl]   Social History   Tobacco Use   Smoking status: Never   Smokeless tobacco: Never  Vaping Use   Vaping Use: Never used  Substance Use Topics   Alcohol use: No   Drug use: No    Family Hx: The patient's family history includes Diabetes in her brother, father, and another family member; Heart attack (age of onset: 38) in her mother; Heart disease in her mother; Heart failure in her mother; High blood pressure in an other family member; Hyperlipidemia in her father and mother; Kidney failure in her father. There is no history of Breast cancer, Colon cancer, Colon polyps, Esophageal cancer, Rectal cancer, Stomach cancer, or Pancreatic cancer.  Review of Systems  Gastrointestinal:  Positive for abdominal pain (LLQ).    EKGs/Labs/Other Test Reviewed:    EKG:  EKG is   ordered today.  The ekg ordered today demonstrates normal sinus rhythm, HR 60, normal axis, non-specific ST-TW changes, QTc 428 ms  Recent Labs: No results found for requested labs within last 8760 hours.    Recent Lipid Panel No results for input(s): CHOL, TRIG, HDL, VLDL, LDLCALC, LDLDIRECT in the last 8760 hours.   Risk Assessment/Calculations:         Physical Exam:    VS:  BP 122/60 (BP Location: Right Arm, Patient Position: Sitting, Cuff Size: Normal)    Pulse 60    Ht 5\' 1"  (1.549 m)    Wt 146 lb 9.6 oz (66.5 kg)    LMP 01/10/2015 (Exact Date)    SpO2 96%    BMI 27.70 kg/m     Wt Readings from Last 3 Encounters:  05/01/21 146 lb 9.6 oz (66.5 kg)  04/17/21 145 lb 2 oz (65.8 kg)  02/27/21 146 lb (66.2 kg)    Constitutional:      Appearance: Healthy appearance. Not in distress.  Neck:     Vascular: No JVR. JVD normal.  Pulmonary:     Effort: Pulmonary effort is normal.     Breath sounds: No wheezing. No rales.  Cardiovascular:     Normal rate. Regular rhythm. Normal S1. Normal S2.      Murmurs: There is no murmur.  Edema:    Peripheral edema absent.  Abdominal:     Palpations: Abdomen is soft.  Skin:    General: Skin is warm and dry.  Neurological:     Mental Status: Alert and oriented to person, place and time.     Cranial Nerves: Cranial nerves are intact.        ASSESSMENT & PLAN:   Family history of ischemic heart disease (IHD) She is doing well without chest pain or shortness of breath to suggest angina.  Her BP is well controlled and her Lipids look optimal for her risk category.  She does not smoke.  We discussed the importance of risk factor modification.  At this point, I do not think that a CAC score would change her current management.  She prefers to f/u annually. Scheduled f/u with Dr. Marlou Porch in 1 year.   Mild mitral regurgitation Mild MR noted on echocardiogram in 1/21.  I cannot appreciate a murmur on exam.  She has no symptoms to suggest significant valve disease.  We could consider a repeat echocardiogram in 2026 or sooner if symptoms warrant.    Pure hypercholesterolemia Managed by primary care.  She remains on Pravastatin.    Essential  hypertension BP is well controlled on Amlodipine and Irbesartan.            Dispo:  Return in about 1 year (around 05/01/2022) for Routine follow up in 1 year with Dr. Marlou Porch. .   Medication Adjustments/Labs and Tests Ordered: Current medicines are reviewed at length with the patient today.  Concerns regarding medicines are outlined above.  Tests Ordered: Orders Placed This Encounter  Procedures   EKG 12-Lead   Medication Changes: No orders of the defined types were placed in this encounter.  Signed, Richardson Dopp, PA-C  05/01/2021 9:50 AM    [facility] [address], [place], [place]  [address] Phone: [phone]; Fax: [phone]

## 2021-05-01 ENCOUNTER — Ambulatory Visit: Payer: 59 | Admitting: Physician Assistant

## 2021-05-01 ENCOUNTER — Telehealth: Payer: Self-pay | Admitting: Gastroenterology

## 2021-05-01 ENCOUNTER — Other Ambulatory Visit: Payer: Self-pay

## 2021-05-01 ENCOUNTER — Encounter: Payer: Self-pay | Admitting: Physician Assistant

## 2021-05-01 ENCOUNTER — Encounter: Payer: Self-pay | Admitting: Gastroenterology

## 2021-05-01 VITALS — BP 122/60 | HR 60 | Ht 61.0 in | Wt 146.6 lb

## 2021-05-01 DIAGNOSIS — Z8249 Family history of ischemic heart disease and other diseases of the circulatory system: Secondary | ICD-10-CM

## 2021-05-01 DIAGNOSIS — I10 Essential (primary) hypertension: Secondary | ICD-10-CM | POA: Diagnosis not present

## 2021-05-01 DIAGNOSIS — E7800 Pure hypercholesterolemia, unspecified: Secondary | ICD-10-CM | POA: Diagnosis not present

## 2021-05-01 DIAGNOSIS — I340 Nonrheumatic mitral (valve) insufficiency: Secondary | ICD-10-CM

## 2021-05-01 NOTE — Telephone Encounter (Signed)
Inbound call from patient requesting to speak with a nurse please.  States she is still experiencing abdominal pain.

## 2021-05-01 NOTE — Patient Instructions (Signed)
Medication Instructions:   Your physician recommends that you continue on your current medications as directed. Please refer to the Current Medication list given to you today.   *If you need a refill on your cardiac medications before your next appointment, please call your pharmacy*   Follow-Up: At [facility], you and your health needs are our priority.  As part of our continuing mission to provide you with exceptional heart care, we have created designated Provider Care Teams.  These Care Teams include your primary Cardiologist (physician) and Advanced Practice Providers (APPs -  Physician Assistants and Nurse Practitioners) who all work together to provide you with the care you need, when you need it.  We recommend signing up for the patient portal called "MyChart".  Sign up information is provided on this After Visit Summary.  MyChart is used to connect with patients for Virtual Visits (Telemedicine).  Patients are able to view lab/test results, encounter notes, upcoming appointments, etc.  Non-urgent messages can be sent to your provider as well.   To learn more about what you can do with MyChart, go to [email or web address].    Your next appointment:   1 year(s)  The format for your next appointment:   In Person  Provider:   Candee Furbish, MD     Other Instructions  Your physician wants you to follow-up in: 1 year with Dr. Marlou Porch.  You will receive a reminder letter in the mail two months in advance. If you don't receive a letter, please call our office to schedule the follow-up appointment.

## 2021-05-01 NOTE — Assessment & Plan Note (Signed)
BP is well controlled on Amlodipine and Irbesartan.

## 2021-05-01 NOTE — Assessment & Plan Note (Signed)
Managed by primary care.  She remains on Pravastatin.

## 2021-05-01 NOTE — Telephone Encounter (Signed)
I should have included in my note that she is allergic to Augmentin and Iodinated contrast.

## 2021-05-01 NOTE — Assessment & Plan Note (Signed)
Mild MR noted on echocardiogram in 1/21.  I cannot appreciate a murmur on exam.  She has no symptoms to suggest significant valve disease.  We could consider a repeat echocardiogram in 2026 or sooner if symptoms warrant.

## 2021-05-01 NOTE — Assessment & Plan Note (Signed)
She is doing well without chest pain or shortness of breath to suggest angina.  Her BP is well controlled and her Lipids look optimal for her risk category.  She does not smoke.  We discussed the importance of risk factor modification.  At this point, I do not think that a CAC score would change her current management.  She prefers to f/u annually. Scheduled f/u with Dr. Marlou Porch in 1 year.

## 2021-05-01 NOTE — Telephone Encounter (Signed)
Agree with low residue diet, increase fluid intake to maintain hydration.  Please switch prescription to Augmentin 875 mg twice daily for 7 days.  Stop Cipro and Flagyl.  Obtain CT abdomen pelvis with contrast to exclude any complications from diverticulitis.  Thank you

## 2021-05-01 NOTE — Telephone Encounter (Signed)
Patient was seen by her PCP Tuesday 04/25/21 for abdominal pain. She was diagnosed with diverticulitis and started on Cipro/Flagyl. She has taken them both as directed until Saturday 04/29/21. She stopped the Flagyl at that time because it was making her dizzy. She has continued the Cipro and has not had any other problems with dizziness. She continues to have LLQ discomfort and pain. She has called the prescribing provider and has not heard back from them.  I did suggest liquid diet for today and if better tomorrow, advance to soft, low residue foods.  Thanks

## 2021-05-02 NOTE — Telephone Encounter (Signed)
Ok, if her symptoms are worsening and she is unable to tolerate oral medications.  She may need to come to ER for further evaluation and management.

## 2021-05-02 NOTE — Telephone Encounter (Signed)
Spoke with the patient. She is feeling better today. She did follow the diet as advised. She is aware of the new recommendations. She agrees to go to the ED if she acutely worsens or she fails to improve. Remains afebrile. No localized tenderness.

## 2021-05-04 ENCOUNTER — Other Ambulatory Visit: Payer: Self-pay | Admitting: Family Medicine

## 2021-05-04 DIAGNOSIS — K5792 Diverticulitis of intestine, part unspecified, without perforation or abscess without bleeding: Secondary | ICD-10-CM

## 2021-05-09 DIAGNOSIS — N390 Urinary tract infection, site not specified: Secondary | ICD-10-CM | POA: Diagnosis not present

## 2021-05-16 ENCOUNTER — Other Ambulatory Visit (HOSPITAL_COMMUNITY): Payer: Self-pay | Admitting: Family Medicine

## 2021-05-16 ENCOUNTER — Other Ambulatory Visit (HOSPITAL_COMMUNITY): Payer: Self-pay

## 2021-05-16 DIAGNOSIS — K5792 Diverticulitis of intestine, part unspecified, without perforation or abscess without bleeding: Secondary | ICD-10-CM

## 2021-05-16 MED ORDER — PREDNISONE 50 MG PO TABS
ORAL_TABLET | ORAL | 0 refills | Status: DC
  Filled 2021-05-16: qty 3, 1d supply, fill #0

## 2021-05-17 ENCOUNTER — Other Ambulatory Visit: Payer: Self-pay

## 2021-05-17 ENCOUNTER — Encounter (HOSPITAL_COMMUNITY): Payer: Self-pay

## 2021-05-17 ENCOUNTER — Ambulatory Visit (HOSPITAL_COMMUNITY)
Admission: RE | Admit: 2021-05-17 | Discharge: 2021-05-17 | Disposition: A | Discharge status: Home / Self Care | Payer: 59 | Source: Ambulatory Visit | Attending: Family Medicine | Admitting: Family Medicine

## 2021-05-17 DIAGNOSIS — K5792 Diverticulitis of intestine, part unspecified, without perforation or abscess without bleeding: Secondary | ICD-10-CM | POA: Diagnosis not present

## 2021-05-17 DIAGNOSIS — R109 Unspecified abdominal pain: Secondary | ICD-10-CM | POA: Diagnosis not present

## 2021-05-17 LAB — POCT I-STAT CREATININE: Creatinine, Ser: 0.8 mg/dL (ref 0.44–1.00)

## 2021-05-17 MED ORDER — IOHEXOL 300 MG/ML  SOLN
100.0000 mL | Freq: Once | INTRAMUSCULAR | Status: AC | PRN
  Administered 2021-05-17: 100 mL via INTRAVENOUS

## 2021-05-17 MED ORDER — SODIUM CHLORIDE (PF) 0.9 % IJ SOLN
INTRAMUSCULAR | Status: AC
  Filled 2021-05-17: qty 50

## 2021-05-22 ENCOUNTER — Telehealth: Payer: Self-pay | Admitting: Gastroenterology

## 2021-05-22 DIAGNOSIS — R350 Frequency of micturition: Secondary | ICD-10-CM | POA: Diagnosis not present

## 2021-05-22 DIAGNOSIS — R109 Unspecified abdominal pain: Secondary | ICD-10-CM | POA: Diagnosis not present

## 2021-05-22 NOTE — Telephone Encounter (Signed)
Not reviewed by the provider yet. ?

## 2021-05-22 NOTE — Telephone Encounter (Signed)
Inbound call from patient would like to discuss results from CT done on 3/1.  ?

## 2021-05-23 ENCOUNTER — Other Ambulatory Visit (HOSPITAL_COMMUNITY): Payer: Self-pay

## 2021-05-23 NOTE — Telephone Encounter (Signed)
Message left for the patient on her voicemail. Patient works 3 rd shift frequently. Also sent information through her My Chart. ?

## 2021-05-23 NOTE — Telephone Encounter (Signed)
Noted increased stool burden and benign appearing subcentimeter hepatic cysts cysts otherwise no acute pathology.  Please advise patient to do bowel purge with MiraLAX and then start using daily MiraLAX to prevent constipation.  Call with any change in symptoms.  Thank you ?

## 2021-05-23 NOTE — Telephone Encounter (Signed)
I just realized the CT was under a Dr Kenton Kingfisher as the ordering provider. Did we need to comment on it as well? See the telephone encounter from 05/01/21. ?

## 2021-06-02 ENCOUNTER — Ambulatory Visit: Payer: 59 | Admitting: Orthopaedic Surgery

## 2021-06-05 ENCOUNTER — Other Ambulatory Visit (HOSPITAL_COMMUNITY): Payer: Self-pay

## 2021-06-05 DIAGNOSIS — Z6828 Body mass index (BMI) 28.0-28.9, adult: Secondary | ICD-10-CM | POA: Diagnosis not present

## 2021-06-05 DIAGNOSIS — Z01419 Encounter for gynecological examination (general) (routine) without abnormal findings: Secondary | ICD-10-CM | POA: Diagnosis not present

## 2021-06-06 ENCOUNTER — Other Ambulatory Visit (HOSPITAL_COMMUNITY): Payer: Self-pay

## 2021-06-06 ENCOUNTER — Ambulatory Visit (INDEPENDENT_AMBULATORY_CARE_PROVIDER_SITE_OTHER): Payer: 59

## 2021-06-06 ENCOUNTER — Other Ambulatory Visit: Payer: Self-pay | Admitting: Gastroenterology

## 2021-06-06 ENCOUNTER — Ambulatory Visit (INDEPENDENT_AMBULATORY_CARE_PROVIDER_SITE_OTHER): Payer: 59 | Admitting: Orthopaedic Surgery

## 2021-06-06 ENCOUNTER — Other Ambulatory Visit: Payer: Self-pay

## 2021-06-06 DIAGNOSIS — G8929 Other chronic pain: Secondary | ICD-10-CM

## 2021-06-06 DIAGNOSIS — M5442 Lumbago with sciatica, left side: Secondary | ICD-10-CM | POA: Diagnosis not present

## 2021-06-06 MED ORDER — METHOCARBAMOL 500 MG PO TABS
500.0000 mg | ORAL_TABLET | Freq: Two times a day (BID) | ORAL | 0 refills | Status: DC | PRN
  Filled 2021-06-06: qty 20, 10d supply, fill #0

## 2021-06-06 MED ORDER — PREDNISONE 10 MG (21) PO TBPK
ORAL_TABLET | ORAL | 0 refills | Status: DC
  Filled 2021-06-06: qty 21, 6d supply, fill #0

## 2021-06-06 MED ORDER — DEXLANSOPRAZOLE 60 MG PO CPDR
DELAYED_RELEASE_CAPSULE | ORAL | 3 refills | Status: DC
  Filled 2021-06-06: qty 90, 90d supply, fill #0

## 2021-06-06 NOTE — Progress Notes (Signed)
? ?Office Visit Note ?  ?Patient: Heather Rios           ?Date of Birth: 07-Dec-1965           ?MRN: [id number] ?Visit Date: 06/06/2021 ?             ?Requested by: Maury Dus, MD ?[address] ?[address] ?[place],  [place] [address] ?PCP: Maury Dus, MD ? ? ?Assessment & Plan: ?Visit Diagnoses:  ?1. Chronic left-sided low back pain with left-sided sciatica   ? ? ?Plan: Impression is left-sided low back pain with radiation into the buttock and occasionally down the left leg.  This point, would like to start the patient on a steroid taper muscle relaxer in addition to a course of physical therapy.  Internal referral is made.  Follow-up with [place] as needed. ? ?Follow-Up Instructions: Return if symptoms worsen or fail to improve.  ? ?Orders:  ?Orders Placed This Encounter  ?Procedures  ? XR Lumbar Spine 2-3 Views  ? Ambulatory referral to Physical Therapy  ? ?Meds ordered this encounter  ?Medications  ? predniSONE (STERAPRED UNI-PAK 21 TAB) 10 MG (21) TBPK tablet  ?  Sig: Take as directed  ?  Dispense:  21 tablet  ?  Refill:  0  ? methocarbamol (ROBAXIN) 500 MG tablet  ?  Sig: Take 1 tablet (500 mg total) by mouth 2 (two) times daily as needed.  ?  Dispense:  20 tablet  ?  Refill:  0  ? ? ? ? Procedures: ?No procedures performed ? ? ?Clinical Data: ?No additional findings. ? ? ?Subjective: ?Chief Complaint  ?Patient presents with  ? Lower Back - Pain  ? ? ?HPI patient is a pleasant 56 year old female who works in Barrister's clerk at [employer] comes in today with left lower back pain for the past week and a half.  She denies any injury or change in activity.  She does note that her pain has progressively worsened.  The pain frequently radiates into the buttock and occasionally down the back of the leg.  She notes catching sensations to the left lower back.  Pain appears to be worse with lumbar extension after she has been in a flexed position for long period of time.  She has been taking Tylenol and Celebrex  without relief.  She denies any paresthesias to the left lower extremity.  No bowel or bladder change. ? ?Review of Systems as detailed in HPI.  All others reviewed and are negative. ? ? ?Objective: ?Vital Signs: LMP 01/10/2015 (Exact Date)  ? ?Physical Exam well-developed well-nourished female no acute distress.  Alert and oriented x3. ? ?Ortho Exam lumbar spine exam shows no spinous tenderness.  She does have tenderness to the left-sided paraspinous musculature.  Positive straight leg raise on the left.  No focal weakness.  No pain with rotation.  She is neurovascular intact distally. ? ?Specialty Comments:  ?No specialty comments available. ? ?Imaging: ?XR Lumbar Spine 2-3 Views ? ?Result Date: 06/06/2021 ?Moderate degenerative changes L4-5 and L5-S1  ? ? ?PMFS History: ?Patient Active Problem List  ? Diagnosis Date Noted  ? Mild mitral regurgitation 05/01/2021  ? 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 Family history of ischemic heart disease (IHD) 02/08/2021  ? Gastro-esophageal reflux disease without esophagitis 02/08/2021  ? Heart murmur 02/08/2021  ? Internal hemorrhoids 02/08/2021  ? 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 Thyromegaly 02/08/2021  ? Uterine fibroid 02/08/2021  ? Sciatica, right side 02/08/2021  ?  Pure hypercholesterolemia 02/08/2021  ? Prediabetes 02/08/2021  ? Plantar fasciitis of left foot 02/08/2021  ? Palpitations 02/08/2021  ? Oral herpes simplex infection 02/08/2021  ? Menopause 02/08/2021  ? Menometrorrhagia 02/08/2021  ? Low back pain 02/08/2021  ? History of psychiatric disorder 02/08/2021  ? Encounter for surveillance of other contraceptives 02/08/2021  ? Eczema 02/08/2021  ? Dysuria 02/08/2021  ? Dysmenorrhea 02/08/2021  ? Carpal tunnel syndrome of left wrist 02/08/2021  ? Anxiety 02/08/2021  ? Allergy to eggs 02/08/2021  ? Allergic rhinitis 02/08/2021  ? Allergic conjunctivitis 02/08/2021  ? Adjustment disorder with depressed mood 02/08/2021  ? Acute ethmoidal sinusitis  02/08/2021  ? Acquired absence of both cervix and uterus 02/08/2021  ? Abnormal vaginal bleeding 02/08/2021  ? S/P TAH (total abdominal hysterectomy) 03/10/2015  ? Vasovagal syncope 07/07/2014  ? Essential hypertension 07/07/2014  ? ?Past Medical History:  ?Diagnosis Date  ? Allergic rhinitis   ? Anemia   ? Anginal pain ([place])   ? Chest Pains 8/16  ? Eczema   ? Environmental allergies   ? Essential hypertension 07/07/2014  ? GERD (gastroesophageal reflux disease)   ? Heart murmur   ? High cholesterol   ? Hyperlipidemia 07/07/2014  ? Hypertension   ? Hypoglycemia   ? Hypoglycemia   ? Plantar fasciitis   ? S/P TAH (total abdominal hysterectomy) 03/10/2015  ? Sinusitis, chronic   ? Vasovagal syncope 07/07/2014  ?  ?Family History  ?Problem Relation Age of Onset  ? Heart disease Mother   ? Heart failure Mother   ? Hyperlipidemia Mother   ? Heart attack Mother 69  ? Diabetes Father   ? Hyperlipidemia Father   ? Kidney failure Father   ?     kidney transplant receipiant  ? Diabetes Brother   ? Diabetes Other   ? High blood pressure Other   ? Breast cancer Neg Hx   ? Colon cancer Neg Hx   ? Colon polyps Neg Hx   ? Esophageal cancer Neg Hx   ? Rectal cancer Neg Hx   ? Stomach cancer Neg Hx   ? Pancreatic cancer Neg Hx   ?  ?Past Surgical History:  ?Procedure Laterality Date  ? ABDOMINAL HYSTERECTOMY N/A 03/10/2015  ? Procedure: HYSTERECTOMY ABDOMINAL;  Surgeon: Thurnell Lose, MD;  Location: [facility];  Service: Gynecology;  Laterality: N/A;  ? BILATERAL SALPINGECTOMY Bilateral 03/10/2015  ? pt denies  ? CHOLECYSTECTOMY    ? FOOT SURGERY    ? ?Social History  ? ?Occupational History  ? Not on file  ?Tobacco Use  ? Smoking status: Never  ? Smokeless tobacco: Never  ?Vaping Use  ? Vaping Use: Never used  ?Substance and Sexual Activity  ? Alcohol use: No  ? Drug use: No  ? Sexual activity: Yes  ?  Birth control/protection: Implant  ? ? ? ? ? ? ?

## 2021-06-07 ENCOUNTER — Other Ambulatory Visit: Payer: Self-pay

## 2021-06-07 ENCOUNTER — Other Ambulatory Visit (HOSPITAL_COMMUNITY): Payer: Self-pay

## 2021-06-07 MED ORDER — AZELASTINE HCL 0.1 % NA SOLN
NASAL | 1 refills | Status: DC
  Filled 2021-06-07: qty 30, 25d supply, fill #0
  Filled 2022-01-11: qty 30, 25d supply, fill #1

## 2021-06-08 ENCOUNTER — Other Ambulatory Visit (HOSPITAL_COMMUNITY): Payer: Self-pay

## 2021-06-08 MED ORDER — FAMOTIDINE 40 MG PO TABS
ORAL_TABLET | ORAL | 3 refills | Status: DC
  Filled 2021-06-08: qty 90, 90d supply, fill #0

## 2021-06-08 MED ORDER — OMEPRAZOLE 40 MG PO CPDR
DELAYED_RELEASE_CAPSULE | ORAL | 3 refills | Status: DC
  Filled 2021-06-08: qty 90, 90d supply, fill #0
  Filled 2021-09-02: qty 90, 90d supply, fill #1
  Filled 2021-12-12: qty 90, 90d supply, fill #2
  Filled 2022-03-21: qty 90, 90d supply, fill #3

## 2021-06-09 ENCOUNTER — Other Ambulatory Visit (HOSPITAL_COMMUNITY): Payer: Self-pay

## 2021-06-09 MED ORDER — AZELASTINE HCL 0.05 % OP SOLN
OPHTHALMIC | 2 refills | Status: DC
  Filled 2021-06-09: qty 6, 30d supply, fill #0

## 2021-06-14 ENCOUNTER — Other Ambulatory Visit (HOSPITAL_COMMUNITY): Payer: Self-pay

## 2021-06-14 NOTE — Therapy (Incomplete)
?OUTPATIENT PHYSICAL THERAPY THORACOLUMBAR EVALUATION ? ? ?Patient Name: Heather Rios ?MRN: [id number] ?DOB:Apr 13, 1965, 56 y.o., female ?Today's Date: 06/14/2021 ? ? ? ?Past Medical History:  ?Diagnosis Date  ? Allergic rhinitis   ? Anemia   ? Anginal pain ([address])   ? Chest Pains 8/16  ? Eczema   ? Environmental allergies   ? Essential hypertension 07/07/2014  ? GERD (gastroesophageal reflux disease)   ? Heart murmur   ? High cholesterol   ? Hyperlipidemia 07/07/2014  ? Hypertension   ? Hypoglycemia   ? Hypoglycemia   ? Plantar fasciitis   ? S/P TAH (total abdominal hysterectomy) 03/10/2015  ? Sinusitis, chronic   ? Vasovagal syncope 07/07/2014  ? ?Past Surgical History:  ?Procedure Laterality Date  ? ABDOMINAL HYSTERECTOMY N/A 03/10/2015  ? Procedure: HYSTERECTOMY ABDOMINAL;  Surgeon: Thurnell Lose, MD;  Location: [facility];  Service: Gynecology;  Laterality: N/A;  ? BILATERAL SALPINGECTOMY Bilateral 03/10/2015  ? pt denies  ? CHOLECYSTECTOMY    ? FOOT SURGERY    ? ?Patient Active Problem List  ? Diagnosis Date Noted  ? Mild mitral regurgitation 05/01/2021  ? 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 Family history of ischemic heart disease (IHD) 02/08/2021  ? Gastro-esophageal reflux disease without esophagitis 02/08/2021  ? Heart murmur 02/08/2021  ? Internal hemorrhoids 02/08/2021  ? 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 Thyromegaly 02/08/2021  ? Uterine fibroid 02/08/2021  ? Sciatica, right side 02/08/2021  ? Pure hypercholesterolemia 02/08/2021  ? Prediabetes 02/08/2021  ? Plantar fasciitis of left foot 02/08/2021  ? Palpitations 02/08/2021  ? Oral herpes simplex infection 02/08/2021  ? Menopause 02/08/2021  ? Menometrorrhagia 02/08/2021  ? Low back pain 02/08/2021  ? History of psychiatric disorder 02/08/2021  ? Encounter for surveillance of other contraceptives 02/08/2021  ? Eczema 02/08/2021  ? Dysuria 02/08/2021  ? Dysmenorrhea 02/08/2021  ? Carpal tunnel syndrome of left wrist 02/08/2021  ? Anxiety  02/08/2021  ? Allergy to eggs 02/08/2021  ? Allergic rhinitis 02/08/2021  ? Allergic conjunctivitis 02/08/2021  ? Adjustment disorder with depressed mood 02/08/2021  ? Acute ethmoidal sinusitis 02/08/2021  ? Acquired absence of both cervix and uterus 02/08/2021  ? Abnormal vaginal bleeding 02/08/2021  ? S/P TAH (total abdominal hysterectomy) 03/10/2015  ? Vasovagal syncope 07/07/2014  ? Essential hypertension 07/07/2014  ? ? ?PCP: Maury Dus, MD ? ?REFERRING PROVIDER: Aundra Dubin, PA-C ? ?REFERRING DIAG: chronic left-sided low back pain with left-sided sciatica ? ?THERAPY DIAG:  ?No diagnosis found. ? ?ONSET DATE: *** ? ?SUBJECTIVE:                                                                                                                                                                                          ? ?  SUBJECTIVE STATEMENT: ?"Catching sensations in left lower back that is worse with extension after being in a flexed position for a long period of time" ?PERTINENT HISTORY:  ?*** ? ?PAIN:  ?Are you having pain? Yes: {yespain:27235::"NPRS scale: ***/10","Pain location: ***","Pain description: ***","Aggravating factors: ***","Relieving factors: ***"} ? ? ?PRECAUTIONS: None ? ?WEIGHT BEARING RESTRICTIONS No ? ?FALLS:  ?Has patient fallen in last 6 months? {fallsyesno:27318} ? ?LIVING ENVIRONMENT: ?Lives with: {[facility] lives with:25569::"lives with their family"} ?Lives in: {Lives in:25570} ?Stairs: {opstairs:27293} ?Has following equipment at home: {Assistive devices:23999} ? ?OCCUPATION: *** ? ?PLOF: {PLOF:24004} ? ?PATIENT GOALS *** ? ? ?OBJECTIVE:  ? ?DIAGNOSTIC FINDINGS:  ?X-ray lumbar spine - moderate degenerative changes L4-5 and L5-S1 ? ?PATIENT SURVEYS:  ?FOTO *** ? ?SCREENING FOR RED FLAGS: ?Bowel or bladder incontinence: {Yes/No:304960894} ?Cauda equina syndrome: {Yes/No:304960894} ? ?COGNITION: ? Overall cognitive status: Within functional limits for tasks  assessed   ?  ?SENSATION: ?WFL ? ? ?POSTURE:  ?*** ? ?PALPATION: ?*** ? ?LUMBAR ROM:  ? ?Active  A/PROM  ?06/14/2021  ?Flexion   ?Extension   ?Right lateral flexion   ?Left lateral flexion   ?Right rotation   ?Left rotation   ? (Blank rows = not tested) ? ?LE ROM: ? ?Active  Right ?06/14/2021 Left ?06/14/2021  ?Hip flexion    ?Hip extension    ?Hip abduction    ?Hip adduction    ?Hip internal rotation    ?Hip external rotation    ?Knee flexion    ?Knee extension    ?Ankle dorsiflexion    ?Ankle plantarflexion    ?Ankle inversion    ?Ankle eversion    ? (Blank rows = not tested) ? ?LE MMT: ? ?MMT Right ?06/14/2021 Left ?06/14/2021  ?Hip flexion    ?Hip extension    ?Hip abduction    ?Hip adduction    ?Hip internal rotation    ?Hip external rotation    ?Knee flexion    ?Knee extension    ?Ankle dorsiflexion    ?Ankle plantarflexion    ?Ankle inversion    ?Ankle eversion    ? (Blank rows = not tested) ? ?LUMBAR SPECIAL TESTS:  ?Straight leg raise test: {pos/neg:25243}, Slump test: {pos/neg:25243}, SI Compression/distraction test: {pos/neg:25243}, and Thomas test: {pos/neg:25243} ? ?FUNCTIONAL TESTS:  ?5 times sit to stand: *** ?Timed up and go (TUG): *** ? ?GAIT: ?Distance walked: *** ?Assistive device utilized: {Assistive devices:23999} ?Level of assistance: {Levels of assistance:24026} ?Comments: *** ? ? ? ?TODAY'S TREATMENT  ?Discussed POC and HEP.  ? ? ?PATIENT EDUCATION:  ?Education details: Educated patient regarding POC and HEP. ?Person educated: Patient ?Education method: Explanation, Demonstration, Tactile cues, Verbal cues, and Handouts ?Education comprehension: verbalized understanding ? ? ?HOME EXERCISE PROGRAM: ?*** ? ?ASSESSMENT: ? ?CLINICAL IMPRESSION: ?Patient is a 56 y.o. female who was seen today for physical therapy evaluation and treatment for low back pain. *** ? ? ?OBJECTIVE IMPAIRMENTS {opptimpairments:25111}.  ? ?ACTIVITY LIMITATIONS {activity limitations:25113}.  ? ?PERSONAL FACTORS {Personal  factors:25162} are also affecting patient's functional outcome.  ? ? ?REHAB POTENTIAL: {rehabpotential:25112} ? ?CLINICAL DECISION MAKING: {clinical decision making:25114} ? ?EVALUATION COMPLEXITY: {Evaluation complexity:25115} ? ? ?GOALS: ?Goals reviewed with patient? No ? ?SHORT TERM GOALS: Target date: 06/28/2021 ? ?Patient will be independent with HEP for PT progression. ?Baseline: initial HEP addressed ?Goal status: INITIAL ? ? ? ?LONG TERM GOALS: Target date: 07/12/2021 ? ?Patient will score *** on FOTO.  ?Baseline:  ?Goal status: INITIAL ? ?2.  *** ?Baseline:  ?Goal status: INITIAL ? ?3.  *** ?  Baseline:  ?Goal status: INITIAL ? ?4.  *** ?Baseline:  ?Goal status: INITIAL ? ? ? ?PLAN: ?PT FREQUENCY: 2x/week ? ?PT DURATION: 4 weeks ? ?PLANNED INTERVENTIONS: Therapeutic exercises, Therapeutic activity, Neuromuscular re-education, Balance training, Gait training, Patient/Family education, Joint mobilization, Stair training, Dry Needling, Electrical stimulation, Spinal mobilization, Cryotherapy, Moist heat, Taping, and Manual therapy. ? ?PLAN FOR NEXT SESSION: *** ? ? ?Renato Gails Zenas Santa, PT ?06/14/2021, 10:30 AM  ?

## 2021-06-15 ENCOUNTER — Other Ambulatory Visit (HOSPITAL_COMMUNITY): Payer: Self-pay

## 2021-06-15 MED ORDER — IRBESARTAN 300 MG PO TABS
300.0000 mg | ORAL_TABLET | Freq: Every morning | ORAL | 0 refills | Status: DC
  Filled 2021-06-15: qty 90, 90d supply, fill #0

## 2021-06-19 ENCOUNTER — Ambulatory Visit: Payer: 59 | Attending: Physician Assistant

## 2021-06-19 DIAGNOSIS — G8929 Other chronic pain: Secondary | ICD-10-CM | POA: Diagnosis not present

## 2021-06-19 DIAGNOSIS — M6281 Muscle weakness (generalized): Secondary | ICD-10-CM | POA: Insufficient documentation

## 2021-06-19 DIAGNOSIS — M5459 Other low back pain: Secondary | ICD-10-CM | POA: Diagnosis not present

## 2021-06-19 DIAGNOSIS — M5442 Lumbago with sciatica, left side: Secondary | ICD-10-CM | POA: Insufficient documentation

## 2021-06-19 NOTE — Therapy (Signed)
?OUTPATIENT PHYSICAL THERAPY THORACOLUMBAR EVALUATION ? ? ?Patient Name: Heather Rios ?MRN: [id number] ?DOB:[birth year], 56 y.o., female ?Today's Date: 06/19/2021 ? ? PT End of Session - 06/19/21 0958   ? ? Visit Number 1   ? Number of Visits 17   ? Date for PT Re-Evaluation 08/14/21   ? PT Start Time 7169   ? PT Stop Time [phone]   ? PT Time Calculation (min) 33 min   ? Activity Tolerance Patient tolerated treatment well;No increased pain   ? Behavior During Therapy [facility] for tasks assessed/performed   ? ?  ?  ? ?  ? ? ?Past Medical History:  ?Diagnosis Date  ? Allergic rhinitis   ? Anemia   ? Anginal pain ([place])   ? Chest Pains 8/16  ? Eczema   ? Environmental allergies   ? Essential hypertension 07/07/2014  ? GERD (gastroesophageal reflux disease)   ? Heart murmur   ? High cholesterol   ? Hyperlipidemia 07/07/2014  ? Hypertension   ? Hypoglycemia   ? Hypoglycemia   ? Plantar fasciitis   ? S/P TAH (total abdominal hysterectomy) 03/10/2015  ? Sinusitis, chronic   ? Vasovagal syncope 07/07/2014  ? ?Past Surgical History:  ?Procedure Laterality Date  ? ABDOMINAL HYSTERECTOMY N/A 03/10/2015  ? Procedure: HYSTERECTOMY ABDOMINAL;  Surgeon: Thurnell Lose, MD;  Location: [facility];  Service: Gynecology;  Laterality: N/A;  ? BILATERAL SALPINGECTOMY Bilateral 03/10/2015  ? pt denies  ? CHOLECYSTECTOMY    ? FOOT SURGERY    ? ?Patient Active Problem List  ? Diagnosis Date Noted  ? Mild mitral regurgitation 05/01/2021  ? 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 Family history of ischemic heart disease (IHD) 02/08/2021  ? Gastro-esophageal reflux disease without esophagitis 02/08/2021  ? Heart murmur 02/08/2021  ? Internal hemorrhoids 02/08/2021  ? 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 Thyromegaly 02/08/2021  ? Uterine fibroid 02/08/2021  ? Sciatica, right side 02/08/2021  ? Pure hypercholesterolemia 02/08/2021  ? Prediabetes 02/08/2021  ? Plantar fasciitis of left foot 02/08/2021  ? Palpitations 02/08/2021  ? Oral herpes  simplex infection 02/08/2021  ? Menopause 02/08/2021  ? Menometrorrhagia 02/08/2021  ? Low back pain 02/08/2021  ? History of psychiatric disorder 02/08/2021  ? Encounter for surveillance of other contraceptives 02/08/2021  ? Eczema 02/08/2021  ? Dysuria 02/08/2021  ? Dysmenorrhea 02/08/2021  ? Carpal tunnel syndrome of left wrist 02/08/2021  ? Anxiety 02/08/2021  ? Allergy to eggs 02/08/2021  ? Allergic rhinitis 02/08/2021  ? Allergic conjunctivitis 02/08/2021  ? Adjustment disorder with depressed mood 02/08/2021  ? Acute ethmoidal sinusitis 02/08/2021  ? Acquired absence of both cervix and uterus 02/08/2021  ? Abnormal vaginal bleeding 02/08/2021  ? S/P TAH (total abdominal hysterectomy) 03/10/2015  ? Vasovagal syncope 07/07/2014  ? Essential hypertension 07/07/2014  ? ? ?PCP: Maury Dus, MD ? ?REFERRING PROVIDER: Aundra Dubin, PA-C ? ?REFERRING DIAG:  ?M54.42,G89.29 (ICD-10-CM) - Chronic left-sided low back pain with left-sided sciatica ? ?THERAPY DIAG:  ?Other low back pain - Plan: PT plan of care cert/re-cert ? ?Muscle weakness (generalized) - Plan: PT plan of care cert/re-cert ? ?ONSET DATE: Early March 2023 ? ?SUBJECTIVE:                                                                                                                                                                                          ? ?  SUBJECTIVE STATEMENT: ?Pt presents to PT with reports of fairly recent onset of lower back and L hip pain. She denies trauma, insidious onset that increases with bending and lifting. Denies bowel/bladder changes or saddle anesthesia. Can not find positions of comfort when pain is exacerbated.  ? ?PERTINENT HISTORY:  ?HTN ? ?PAIN:  ?Are you having pain? No: NPRS scale: 0/10 - 7/10 at worst ?Pain location: lower back, L hip ?Pain description: dull ache ?Aggravating factors: bending, lifting ?Relieving factors: medication, heat ? ? ?PRECAUTIONS: None ? ?WEIGHT BEARING RESTRICTIONS No ? ?FALLS:  ?Has  patient fallen in last 6 months? No ? ?LIVING ENVIRONMENT: ?Lives with: lives alone ?Lives in: House/apartment ?Stairs: No ?Has following equipment at home: None ? ?OCCUPATION: works in Barrister's clerk for [employer]  ? ?PLOF: Independent and Independent with basic ADLs ? ?PATIENT GOALS: decrease lower back pain to improve comfort and ADL performance ? ? ?OBJECTIVE:  ? ?DIAGNOSTIC FINDINGS:  ?See chart ? ?PATIENT SURVEYS:  ?FOTO 40% function; 59% predicted ? ?COGNITION: ? Overall cognitive status: Within functional limits for tasks assessed   ?  ?SENSATION: ?WFL ? ?POSTURE:  ?Slumped sitting posture, rounded shoulders ? ?PALPATION: ?TTP to L lumbar paraspinals, L QL ? ?LE MMT: ? ?MMT Right ?06/19/2021 Left ?06/19/2021  ?Hip flexion  5/5 3+/5  ?Hip extension    ?Hip abduction 5/5 3+/5  ?Hip adduction 5/5   ?Hip external rotation    ?Hip internal rotation    ?Knee extension 5/5   ?Knee flexion    ?Ankle dorsiflexion  5/5 4/5  ?Ankle plantarflexion 5/5 4/5  ?Ankle inversion    ?Ankle eversion    ?Grossly    ?(Blank rows = not tested) ?**L LE myotomes global 4/5 ? ?LUMBAR SPECIAL TESTS:  ?Straight leg raise test: Positive and Slump test: Positive ? ?FUNCTIONAL TESTS:  ?30 Second Sit to Stand: 8 reps ? ?GAIT: ?Distance walked: 53f ?Assistive device utilized: None ?Level of assistance: Complete Independence ?Comments: slight antalgic gait L  ? ?[facility]S TREATMENT  [facility] ?Therapeutic Exercise: ?Seated sciatic nerve glide x 10 L ?Bridge x 10  ?Supine piriformis stretch L x 30" ?Supine clamshell GTB x 10 ? ?PATIENT EDUCATION:  ?Education details: eval findings, FOTO, HEP, POC ?Person educated: Patient ?Education method: Explanation, Demonstration, and Handouts ?Education comprehension: verbalized understanding and returned demonstration ? ?HOME EXERCISE PROGRAM: ?Access Code: [email or web address]?URL: [email or web address] ?Date: 06/19/2021 ?Prepared by: DOctavio Manns? ?Exercises ?- Seated  Sciatic Tensioner  - 1 x daily - 7 x weekly - 2 sets - 10 reps ?- Supine Bridge  - 1 x daily - 7 x weekly - 2 sets - 10 reps ?- Supine Piriformis Stretch with Leg Straight  - 1 x daily - 7 x weekly - 3 reps - 30 sec hold ?- Hooklying Clamshell with Resistance  - 1 x daily - 7 x weekly - 3 sets - 15 reps ? ?ASSESSMENT: ? ?CLINICAL IMPRESSION: ?Patient is a 56y.o. F who was seen today for physical therapy evaluation and treatment for recent onset of lower back and L hip pain. Physical findings are consistent with MD impression, as pt has decrease in myotomal strength on L LE, palpable pain and tenderness in lumbar and L proximal hip, and decreased functional mobility assessed through 30 Second Sit to Stand test. Her FOTO score shows she is operating well below PLOF and would benefit from skilled PT services working on decreasing pain and improving functional ability. ? ? ?OBJECTIVE IMPAIRMENTS decreased  endurance, decreased mobility, difficulty walking, decreased strength, and pain.  ? ?ACTIVITY LIMITATIONS community activity, driving, and occupation.  ? ?PERSONAL FACTORS 1 comorbidity: HTN  are also affecting patient's functional outcome.  ? ? ?REHAB POTENTIAL: Excellent ? ?CLINICAL DECISION MAKING: Stable/uncomplicated ? ?EVALUATION COMPLEXITY: Low ? ? ?GOALS: ?Goals reviewed with patient? No ? ?SHORT TERM GOALS: Target date: 07/10/2021 ? ?Pt will be compliant and knowledgeable with initial HEP for improved comfort and carryover ?Baseline: initial HEP given  ?Goal status: INITIAL ? ?2.  Pt will self report lower back and L hip pain no greater than 5/10 for improved comfort and functional ability ?Baseline: 7/10 at worst ?Goal status: INITIAL ? ? ?LONG TERM GOALS: Target date: 08/14/2021 ? ?Pt will self report lower back and L hip pain no greater than 2/10 for improved comfort and functional ability ?Baseline: 7/10 at worst ?Goal status: INITIAL ? ?2.  Pt will improve FOTO function score to no less than 59% as proxy  for functional improvement ?Baseline: 40% function ?Goal status: INITIAL ? ?3.  Pt will increase 30 Second Sit to Stand rep count to no less than 10 reps for improved balance, strength, and functional mobility ?Base

## 2021-06-23 ENCOUNTER — Other Ambulatory Visit (HOSPITAL_COMMUNITY): Payer: Self-pay

## 2021-06-27 ENCOUNTER — Encounter: Payer: Self-pay | Admitting: Gastroenterology

## 2021-07-03 ENCOUNTER — Ambulatory Visit: Payer: 59

## 2021-07-03 DIAGNOSIS — M6281 Muscle weakness (generalized): Secondary | ICD-10-CM | POA: Diagnosis not present

## 2021-07-03 DIAGNOSIS — M5459 Other low back pain: Secondary | ICD-10-CM | POA: Diagnosis not present

## 2021-07-03 DIAGNOSIS — G8929 Other chronic pain: Secondary | ICD-10-CM | POA: Diagnosis not present

## 2021-07-03 DIAGNOSIS — M5442 Lumbago with sciatica, left side: Secondary | ICD-10-CM | POA: Diagnosis not present

## 2021-07-03 NOTE — Therapy (Signed)
?OUTPATIENT PHYSICAL THERAPY TREATMENT NOTE ? ? ?Patient Name: Heather Rios ?MRN: [id number] ?DOB:December 09, 1965, 56 y.o., female ?Today's Date: 07/03/2021 ? ?PCP: Maury Dus, MD ?REFERRING PROVIDER: Maury Dus, MD ? ?END OF SESSION:  ? PT End of Session - 07/03/21 0826   ? ? Visit Number 2   ? Number of Visits 17   ? Date for PT Re-Evaluation 08/14/21   ? PT Start Time [phone]   ? PT Stop Time 0906   ? PT Time Calculation (min) 38 min   ? Activity Tolerance Patient tolerated treatment well;No increased pain   ? Behavior During Therapy [facility] for tasks assessed/performed   ? ?  ?  ? ?  ? ? ?Past Medical History:  ?Diagnosis Date  ? Allergic rhinitis   ? Anemia   ? Anginal pain ([place])   ? Chest Pains 8/16  ? Eczema   ? Environmental allergies   ? Essential hypertension 07/07/2014  ? GERD (gastroesophageal reflux disease)   ? Heart murmur   ? High cholesterol   ? Hyperlipidemia 07/07/2014  ? Hypertension   ? Hypoglycemia   ? Hypoglycemia   ? Plantar fasciitis   ? S/P TAH (total abdominal hysterectomy) 03/10/2015  ? Sinusitis, chronic   ? Vasovagal syncope 07/07/2014  ? ?Past Surgical History:  ?Procedure Laterality Date  ? ABDOMINAL HYSTERECTOMY N/A 03/10/2015  ? Procedure: HYSTERECTOMY ABDOMINAL;  Surgeon: Thurnell Lose, MD;  Location: [facility];  Service: Gynecology;  Laterality: N/A;  ? BILATERAL SALPINGECTOMY Bilateral 03/10/2015  ? pt denies  ? CHOLECYSTECTOMY    ? FOOT SURGERY    ? ?Patient Active Problem List  ? Diagnosis Date Noted  ? Mild mitral regurgitation 05/01/2021  ? 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 Family history of ischemic heart disease (IHD) 02/08/2021  ? Gastro-esophageal reflux disease without esophagitis 02/08/2021  ? Heart murmur 02/08/2021  ? Internal hemorrhoids 02/08/2021  ? 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 Thyromegaly 02/08/2021  ? Uterine fibroid 02/08/2021  ? Sciatica, right side 02/08/2021  ? Pure hypercholesterolemia 02/08/2021  ? Prediabetes 02/08/2021  ? Plantar  fasciitis of left foot 02/08/2021  ? Palpitations 02/08/2021  ? Oral herpes simplex infection 02/08/2021  ? Menopause 02/08/2021  ? Menometrorrhagia 02/08/2021  ? Low back pain 02/08/2021  ? History of psychiatric disorder 02/08/2021  ? Encounter for surveillance of other contraceptives 02/08/2021  ? Eczema 02/08/2021  ? Dysuria 02/08/2021  ? Dysmenorrhea 02/08/2021  ? Carpal tunnel syndrome of left wrist 02/08/2021  ? Anxiety 02/08/2021  ? Allergy to eggs 02/08/2021  ? Allergic rhinitis 02/08/2021  ? Allergic conjunctivitis 02/08/2021  ? Adjustment disorder with depressed mood 02/08/2021  ? Acute ethmoidal sinusitis 02/08/2021  ? Acquired absence of both cervix and uterus 02/08/2021  ? Abnormal vaginal bleeding 02/08/2021  ? S/P TAH (total abdominal hysterectomy) 03/10/2015  ? Vasovagal syncope 07/07/2014  ? Essential hypertension 07/07/2014  ? ? ?REFERRING DIAG:  ?M54.42,G89.29 (ICD-10-CM) - Chronic left-sided low back pain with left-sided sciatica ? ?THERAPY DIAG:  ?Other low back pain ? ?Muscle weakness (generalized) ? ?PERTINENT HISTORY:  ?HTN ? ?PRECAUTIONS:  ?None ? ?SUBJECTIVE:  ?Pt presents to PT with no current lower back pain, but does note some L hip pain. Has been compliant with initial HEP with no adverse effect. She is ready to begin PT at this time.  ? ?Pain: ?Are you having pain? No: NPRS scale: 0/10 - 7/10 at worst ?Pain location: lower back, L hip ?Pain description: dull ache ?Aggravating factors:  bending, lifting ?Relieving factors: medication, heat ? ?OBJECTIVE:  ?  ?PATIENT SURVEYS:  ?FOTO 40% function; 59% predicted ?  ?LE MMT: ?  ?MMT Right ?06/19/2021 Left ?06/19/2021  ?Hip flexion  5/5 3+/5  ?Hip extension      ?Hip abduction 5/5 3+/5  ?Hip adduction 5/5    ?Hip external rotation      ?Hip internal rotation      ?Knee extension 5/5    ?Knee flexion      ?Ankle dorsiflexion  5/5 4/5  ?Ankle plantarflexion 5/5 4/5  ?Ankle inversion      ?Ankle eversion      ?Grossly      ?(Blank rows = not  tested) ?**L LE myotomes global 4/5 ?  ?FUNCTIONAL TESTS:  ?30 Second Sit to Stand: 8 reps ?  ?TODAY'S TREATMENT  ?[facility]: DATE: 07/03/2021 ?Therapeutic Exercise: ?NuStep lvl 5 LE only x 5 min while taking subjective ?Bridge 2x10  ?Sidelying clamshell RTB 2x10 ?Supine PPT 2x10 - 5" ?Supine SLR 2x10 each ?Supine sciatic nerve glide x 15 L ?Supine piriformis stretch L 2x30" ?STS no UE support 2x10 ?Lateral walk at counter x 3 laps RTB ?Standing hip abd/ext 2x10 RTB ? ?[facility] Adult PT Treatment: DATE: 06/19/2021 ?Therapeutic Exercise: ?Seated sciatic nerve glide x 10 L ?Bridge x 10  ?Supine piriformis stretch L x 30" ?Supine clamshell GTB x 10 ?  ?PATIENT EDUCATION:  ?Education details: continue HEP ?Person educated: Patient ?Education method: Explanation, Demonstration, and Handouts ?Education comprehension: verbalized understanding and returned demonstration ?  ?HOME EXERCISE PROGRAM: ?Access Code: [email or web address] ?URL: [email or web address] ?Date: 06/19/2021 ?Prepared by: Octavio Manns ?  ?Exercises ?- Seated Sciatic Tensioner  - 1 x daily - 7 x weekly - 2 sets - 10 reps ?- Supine Bridge  - 1 x daily - 7 x weekly - 2 sets - 10 reps ?- Supine Piriformis Stretch with Leg Straight  - 1 x daily - 7 x weekly - 3 reps - 30 sec hold ?- Hooklying Clamshell with Resistance  - 1 x daily - 7 x weekly - 3 sets - 15 reps ?  ?ASSESSMENT: ?  ?CLINICAL IMPRESSION: ?Pt was able to complete all prescribed exercises with no adverse effect or increase in pain. Therapy today focused on improving core and proximal hip strength to reduce pain and improve mobility. Pt continues to benefit from skilled PT services and will continue to be seen and progressed as able.  ?  ?  ?OBJECTIVE IMPAIRMENTS decreased endurance, decreased mobility, difficulty walking, decreased strength, and pain.  ?  ?ACTIVITY LIMITATIONS community activity, driving, and occupation.  ?  ?PERSONAL FACTORS 1 comorbidity: HTN  are also affecting  patient's functional outcome.  ?  ?  ?GOALS: ?Goals reviewed with patient? No ?  ?SHORT TERM GOALS: Target date: 07/10/2021 ?  ?Pt will be compliant and knowledgeable with initial HEP for improved comfort and carryover ?Baseline: initial HEP given  ?Goal status: INITIAL ?  ?2.  Pt will self report lower back and L hip pain no greater than 5/10 for improved comfort and functional ability ?Baseline: 7/10 at worst ?Goal status: INITIAL ?  ?  ?LONG TERM GOALS: Target date: 08/14/2021 ?  ?Pt will self report lower back and L hip pain no greater than 2/10 for improved comfort and functional ability ?Baseline: 7/10 at worst ?Goal status: INITIAL ?  ?2.  Pt will improve FOTO function score to no less than 59% as proxy for functional improvement ?Baseline: 40% function ?Goal  status: INITIAL ?  ?3.  Pt will increase 30 Second Sit to Stand rep count to no less than 10 reps for improved balance, strength, and functional mobility ?Baseline: 8 reps  ?Goal status: INITIAL ?  ?4.  Pt will be able to stand at work for >31mn without increase in lower back pain for improved comfort with work and home ADLs ?Baseline: 15 min ?Goal status: INITIAL ?  ?  ?PLAN: ?PT FREQUENCY: 1-2x/week ?  ?PT DURATION: 8 weeks ?  ?PLANNED INTERVENTIONS: Therapeutic exercises, Therapeutic activity, Neuromuscular re-education, Balance training, Gait training, Patient/Family education, Joint mobilization, Dry Needling, Electrical stimulation, and Manual therapy. ?  ?PLAN FOR NEXT SESSION: assess HEP response, progress core and hip strengthening, TPDN?  ? ? ? ?DWard Chatters PT ?07/03/2021, 9:06 AM ? ?   ?

## 2021-07-10 ENCOUNTER — Ambulatory Visit: Payer: 59

## 2021-07-10 DIAGNOSIS — M6281 Muscle weakness (generalized): Secondary | ICD-10-CM

## 2021-07-10 DIAGNOSIS — M5459 Other low back pain: Secondary | ICD-10-CM

## 2021-07-10 DIAGNOSIS — M5442 Lumbago with sciatica, left side: Secondary | ICD-10-CM | POA: Diagnosis not present

## 2021-07-10 DIAGNOSIS — G8929 Other chronic pain: Secondary | ICD-10-CM | POA: Diagnosis not present

## 2021-07-10 NOTE — Therapy (Signed)
?OUTPATIENT PHYSICAL THERAPY TREATMENT NOTE ? ? ?Patient Name: Heather Rios ?MRN: [id number] ?DOB:[birth year], 56 y.o., female ?Today's Date: 07/10/2021 ? ?PCP: Maury Dus, MD ?REFERRING PROVIDER: Maury Dus, MD ? ?END OF SESSION:  ? PT End of Session - 07/10/21 7035   ? ? Visit Number 3   ? Number of Visits 17   ? Date for PT Re-Evaluation 08/14/21   ? PT Start Time [phone]   ? PT Stop Time 8182   ? PT Time Calculation (min) 38 min   ? Activity Tolerance Patient tolerated treatment well;No increased pain   ? Behavior During Therapy [facility] for tasks assessed/performed   ? ?  ?  ? ?  ? ? ? ?Past Medical History:  ?Diagnosis Date  ? Allergic rhinitis   ? Anemia   ? Anginal pain ([place])   ? Chest Pains 8/16  ? Eczema   ? Environmental allergies   ? Essential hypertension 07/07/2014  ? GERD (gastroesophageal reflux disease)   ? Heart murmur   ? High cholesterol   ? Hyperlipidemia 07/07/2014  ? Hypertension   ? Hypoglycemia   ? Hypoglycemia   ? Plantar fasciitis   ? S/P TAH (total abdominal hysterectomy) 03/10/2015  ? Sinusitis, chronic   ? Vasovagal syncope 07/07/2014  ? ?Past Surgical History:  ?Procedure Laterality Date  ? ABDOMINAL HYSTERECTOMY N/A 03/10/2015  ? Procedure: HYSTERECTOMY ABDOMINAL;  Surgeon: Thurnell Lose, MD;  Location: [facility];  Service: Gynecology;  Laterality: N/A;  ? BILATERAL SALPINGECTOMY Bilateral 03/10/2015  ? pt denies  ? CHOLECYSTECTOMY    ? FOOT SURGERY    ? ?Patient Active Problem List  ? Diagnosis Date Noted  ? Mild mitral regurgitation 05/01/2021  ? 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 Family history of ischemic heart disease (IHD) 02/08/2021  ? Gastro-esophageal reflux disease without esophagitis 02/08/2021  ? Heart murmur 02/08/2021  ? Internal hemorrhoids 02/08/2021  ? 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 Thyromegaly 02/08/2021  ? Uterine fibroid 02/08/2021  ? Sciatica, right side 02/08/2021  ? Pure hypercholesterolemia 02/08/2021  ? Prediabetes 02/08/2021  ? Plantar  fasciitis of left foot 02/08/2021  ? Palpitations 02/08/2021  ? Oral herpes simplex infection 02/08/2021  ? Menopause 02/08/2021  ? Menometrorrhagia 02/08/2021  ? Low back pain 02/08/2021  ? History of psychiatric disorder 02/08/2021  ? Encounter for surveillance of other contraceptives 02/08/2021  ? Eczema 02/08/2021  ? Dysuria 02/08/2021  ? Dysmenorrhea 02/08/2021  ? Carpal tunnel syndrome of left wrist 02/08/2021  ? Anxiety 02/08/2021  ? Allergy to eggs 02/08/2021  ? Allergic rhinitis 02/08/2021  ? Allergic conjunctivitis 02/08/2021  ? Adjustment disorder with depressed mood 02/08/2021  ? Acute ethmoidal sinusitis 02/08/2021  ? Acquired absence of both cervix and uterus 02/08/2021  ? Abnormal vaginal bleeding 02/08/2021  ? S/P TAH (total abdominal hysterectomy) 03/10/2015  ? Vasovagal syncope 07/07/2014  ? Essential hypertension 07/07/2014  ? ? ?REFERRING DIAG:  ?M54.42,G89.29 (ICD-10-CM) - Chronic left-sided low back pain with left-sided sciatica ? ?THERAPY DIAG:  ?Other low back pain ? ?Muscle weakness (generalized) ? ?PERTINENT HISTORY:  ?HTN ? ?PRECAUTIONS:  ?None ? ?SUBJECTIVE:  ?Pt presents to PT with reports of increased L hip pain. Was watching her granddaughter this past weekend and thinks being busy increased her pain. Pt is ready to begin PT a this time.  ? ?Pain: ?Are you having pain?  ?Yes: NPRS scale: 6/10 ?Pain location: L hip ?Pain description: dull ache ?Aggravating factors: bending, lifting ?Relieving factors: medication, heat ? ?  OBJECTIVE:  ?  ?PATIENT SURVEYS:  ?FOTO 40% function; 59% predicted ?  ?LE MMT: ?  ?MMT Right ?06/19/2021 Left ?06/19/2021  ?Hip flexion  5/5 3+/5  ?Hip extension      ?Hip abduction 5/5 3+/5  ?Hip adduction 5/5    ?Hip external rotation      ?Hip internal rotation      ?Knee extension 5/5    ?Knee flexion      ?Ankle dorsiflexion  5/5 4/5  ?Ankle plantarflexion 5/5 4/5  ?Ankle inversion      ?Ankle eversion      ?Grossly      ?(Blank rows = not tested) ?**L LE myotomes  global 4/5 ?  ?FUNCTIONAL TESTS:  ?30 Second Sit to Stand: 8 reps ?  ?TODAY'S TREATMENT  ?[facility] Adult PT Treatment: DATE: 07/10/2021 ?Therapeutic Exercise: ?NuStep lvl 6 LE only x 4 min while taking subjective ?Bridge 2x15  ?Sidelying clamshell RTB 2x10 ?Supine PPT 2x10 - 5" ?Supine PPT w/ ball squeeze 2x10 ?Supine SLR 2x10 each ?Supine sciatic nerve glide x 15 L ?Supine piriformis stretch L 2x30" ?STS no UE support 2x10 ?Lateral walk at counter x 3 laps RTB ?Standing hip abd/ext 2x10 RTB ?Modalities: ?MHP to L hip x 10 min post session ? ?[facility] Adult PT Treatment: DATE: 07/03/2021 ?Therapeutic Exercise: ?NuStep lvl 5 LE only x 5 min while taking subjective ?Bridge 2x10  ?Sidelying clamshell RTB 2x10 ?Supine PPT 2x10 - 5" ?Supine SLR 2x10 each ?Supine sciatic nerve glide x 15 L ?Supine piriformis stretch L 2x30" ?STS no UE support 2x10 ?Lateral walk at counter x 3 laps RTB ?Standing hip abd/ext 2x10 RTB ? ?[facility] Adult PT Treatment: DATE: 06/19/2021 ?Therapeutic Exercise: ?Seated sciatic nerve glide x 10 L ?Bridge x 10  ?Supine piriformis stretch L x 30" ?Supine clamshell GTB x 10 ?  ?PATIENT EDUCATION:  ?Education details: continue HEP ?Person educated: Patient ?Education method: Explanation, Demonstration, and Handouts ?Education comprehension: verbalized understanding and returned demonstration ?  ?HOME EXERCISE PROGRAM: ?Access Code: [email or web address] ?URL: [email or web address] ?Date: 07/10/2021 ?Prepared by: Octavio Manns ? ?Exercises ?- Seated Sciatic Tensioner  - 1 x daily - 7 x weekly - 2 sets - 10 reps ?- Supine Bridge  - 1 x daily - 7 x weekly - 2 sets - 10 reps ?- Supine Piriformis Stretch with Leg Straight  - 1 x daily - 7 x weekly - 3 reps - 30 sec hold ?- Hooklying Clamshell with Resistance  - 1 x daily - 7 x weekly - 3 sets - 15 reps ?- Supine Posterior Pelvic Tilt  - 1 x daily - 7 x weekly - 2 sets - 10 reps - 5 sec hold ?- Standing Hip Abduction with Resistance at Ankles and Counter Support  - 1 x  daily - 7 x weekly - 2 sets - 10 reps ?- Standing Hip Extension with Resistance at Ankles and Counter Support  - 1 x daily - 7 x weekly - 2 sets - 10 reps ?  ?ASSESSMENT: ?  ?CLINICAL IMPRESSION: ?Pt was again able to complete prescribed exercises with no adverse effect or increase in pain. Therapy today continued to progress core and proximal hip strengthening in order to decrease pain and improve functional ability. Pt continues to benefit from skilled PT services and will continue to be seen and progressed as able.  ?  ?  ?OBJECTIVE IMPAIRMENTS decreased endurance, decreased mobility, difficulty walking, decreased strength, and pain.  ?  ?ACTIVITY LIMITATIONS community activity, driving, and  occupation.  ?  ?PERSONAL FACTORS 1 comorbidity: HTN  are also affecting patient's functional outcome.  ?  ?  ?GOALS: ?Goals reviewed with patient? No ?  ?SHORT TERM GOALS: Target date: 07/10/2021 ?  ?Pt will be compliant and knowledgeable with initial HEP for improved comfort and carryover ?Baseline: initial HEP given  ?Goal status: INITIAL ?  ?2.  Pt will self report lower back and L hip pain no greater than 5/10 for improved comfort and functional ability ?Baseline: 7/10 at worst ?Goal status: INITIAL ?  ?  ?LONG TERM GOALS: Target date: 08/14/2021 ?  ?Pt will self report lower back and L hip pain no greater than 2/10 for improved comfort and functional ability ?Baseline: 7/10 at worst ?Goal status: INITIAL ?  ?2.  Pt will improve FOTO function score to no less than 59% as proxy for functional improvement ?Baseline: 40% function ?Goal status: INITIAL ?  ?3.  Pt will increase 30 Second Sit to Stand rep count to no less than 10 reps for improved balance, strength, and functional mobility ?Baseline: 8 reps  ?Goal status: INITIAL ?  ?4.  Pt will be able to stand at work for >67mn without increase in lower back pain for improved comfort with work and home ADLs ?Baseline: 15 min ?Goal status: INITIAL ?  ?  ?PLAN: ?PT FREQUENCY:  1-2x/week ?  ?PT DURATION: 8 weeks ?  ?PLANNED INTERVENTIONS: Therapeutic exercises, Therapeutic activity, Neuromuscular re-education, Balance training, Gait training, Patient/Family education, Joint mobilization,

## 2021-07-12 ENCOUNTER — Other Ambulatory Visit (HOSPITAL_COMMUNITY): Payer: Self-pay

## 2021-07-12 DIAGNOSIS — J309 Allergic rhinitis, unspecified: Secondary | ICD-10-CM | POA: Diagnosis not present

## 2021-07-12 DIAGNOSIS — E7800 Pure hypercholesterolemia, unspecified: Secondary | ICD-10-CM | POA: Diagnosis not present

## 2021-07-12 DIAGNOSIS — K219 Gastro-esophageal reflux disease without esophagitis: Secondary | ICD-10-CM | POA: Diagnosis not present

## 2021-07-12 DIAGNOSIS — B002 Herpesviral gingivostomatitis and pharyngotonsillitis: Secondary | ICD-10-CM | POA: Diagnosis not present

## 2021-07-12 DIAGNOSIS — M545 Low back pain, unspecified: Secondary | ICD-10-CM | POA: Diagnosis not present

## 2021-07-12 DIAGNOSIS — Z91012 Allergy to eggs: Secondary | ICD-10-CM | POA: Diagnosis not present

## 2021-07-12 DIAGNOSIS — R634 Abnormal weight loss: Secondary | ICD-10-CM | POA: Diagnosis not present

## 2021-07-12 DIAGNOSIS — L309 Dermatitis, unspecified: Secondary | ICD-10-CM | POA: Diagnosis not present

## 2021-07-12 DIAGNOSIS — I10 Essential (primary) hypertension: Secondary | ICD-10-CM | POA: Diagnosis not present

## 2021-07-12 MED ORDER — DICLOFENAC SODIUM 1 % EX GEL
CUTANEOUS | 3 refills | Status: DC
  Filled 2021-07-12: qty 100, 30d supply, fill #0

## 2021-07-12 MED ORDER — CYCLOBENZAPRINE HCL 10 MG PO TABS
ORAL_TABLET | ORAL | 2 refills | Status: DC
Start: 2021-07-12 — End: 2022-09-21
  Filled 2021-07-12: qty 60, 60d supply, fill #0

## 2021-07-12 MED ORDER — TRIAMCINOLONE ACETONIDE 0.1 % EX CREA
TOPICAL_CREAM | CUTANEOUS | 3 refills | Status: DC
  Filled 2021-07-12: qty 80, 30d supply, fill #0

## 2021-07-12 MED ORDER — AMLODIPINE BESYLATE 5 MG PO TABS
ORAL_TABLET | ORAL | 1 refills | Status: DC
Start: 2021-07-12 — End: 2022-09-21
  Filled 2021-07-12 – 2021-09-28 (×2): qty 90, 90d supply, fill #0

## 2021-07-12 MED ORDER — MONTELUKAST SODIUM 10 MG PO TABS
ORAL_TABLET | ORAL | 1 refills | Status: DC
  Filled 2021-07-12: qty 90, 90d supply, fill #0

## 2021-07-12 MED ORDER — IRBESARTAN 300 MG PO TABS
ORAL_TABLET | ORAL | 1 refills | Status: DC
  Filled 2021-07-12 – 2021-09-02 (×2): qty 90, 90d supply, fill #0
  Filled 2021-12-12: qty 90, 90d supply, fill #1

## 2021-07-12 MED ORDER — VALACYCLOVIR HCL 1 G PO TABS
ORAL_TABLET | ORAL | 1 refills | Status: DC
  Filled 2021-07-12: qty 20, 5d supply, fill #0

## 2021-07-17 ENCOUNTER — Ambulatory Visit: Payer: 59

## 2021-07-20 ENCOUNTER — Other Ambulatory Visit (HOSPITAL_COMMUNITY): Payer: Self-pay

## 2021-07-24 ENCOUNTER — Ambulatory Visit: Payer: 59 | Attending: Physician Assistant

## 2021-07-24 DIAGNOSIS — M5459 Other low back pain: Secondary | ICD-10-CM | POA: Insufficient documentation

## 2021-07-24 DIAGNOSIS — M6281 Muscle weakness (generalized): Secondary | ICD-10-CM | POA: Insufficient documentation

## 2021-07-24 NOTE — Therapy (Signed)
?OUTPATIENT PHYSICAL THERAPY TREATMENT NOTE ? ? ?Patient Name: Heather Rios ?MRN: [id number] ?DOB:08-27-1965, 56 y.o., female ?Today's Date: 07/24/2021 ? ?PCP: Maury Dus, MD ?REFERRING PROVIDER: Aundra Dubin, PA-C ? ?END OF SESSION:  ? PT End of Session - 07/24/21 6734   ? ? Visit Number 4   ? Number of Visits 17   ? Date for PT Re-Evaluation 08/14/21   ? PT Start Time [phone]   ? PT Stop Time 9024   ? PT Time Calculation (min) 39 min   ? Activity Tolerance Patient tolerated treatment well;No increased pain   ? Behavior During Therapy [facility] for tasks assessed/performed   ? ?  ?  ? ?  ? ? ? ? ?Past Medical History:  ?Diagnosis Date  ? Allergic rhinitis   ? Anemia   ? Anginal pain ([place])   ? Chest Pains 8/16  ? Eczema   ? Environmental allergies   ? Essential hypertension 07/07/2014  ? GERD (gastroesophageal reflux disease)   ? Heart murmur   ? High cholesterol   ? Hyperlipidemia 07/07/2014  ? Hypertension   ? Hypoglycemia   ? Hypoglycemia   ? Plantar fasciitis   ? S/P TAH (total abdominal hysterectomy) 03/10/2015  ? Sinusitis, chronic   ? Vasovagal syncope 07/07/2014  ? ?Past Surgical History:  ?Procedure Laterality Date  ? ABDOMINAL HYSTERECTOMY N/A 03/10/2015  ? Procedure: HYSTERECTOMY ABDOMINAL;  Surgeon: Thurnell Lose, MD;  Location: [facility];  Service: Gynecology;  Laterality: N/A;  ? BILATERAL SALPINGECTOMY Bilateral 03/10/2015  ? pt denies  ? CHOLECYSTECTOMY    ? FOOT SURGERY    ? ?Patient Active Problem List  ? Diagnosis Date Noted  ? Mild mitral regurgitation 05/01/2021  ? 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 Family history of ischemic heart disease (IHD) 02/08/2021  ? Gastro-esophageal reflux disease without esophagitis 02/08/2021  ? Heart murmur 02/08/2021  ? Internal hemorrhoids 02/08/2021  ? 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 Thyromegaly 02/08/2021  ? Uterine fibroid 02/08/2021  ? Sciatica, right side 02/08/2021  ? Pure hypercholesterolemia 02/08/2021  ? Prediabetes 02/08/2021  ? Plantar  fasciitis of left foot 02/08/2021  ? Palpitations 02/08/2021  ? Oral herpes simplex infection 02/08/2021  ? Menopause 02/08/2021  ? Menometrorrhagia 02/08/2021  ? Low back pain 02/08/2021  ? History of psychiatric disorder 02/08/2021  ? Encounter for surveillance of other contraceptives 02/08/2021  ? Eczema 02/08/2021  ? Dysuria 02/08/2021  ? Dysmenorrhea 02/08/2021  ? Carpal tunnel syndrome of left wrist 02/08/2021  ? Anxiety 02/08/2021  ? Allergy to eggs 02/08/2021  ? Allergic rhinitis 02/08/2021  ? Allergic conjunctivitis 02/08/2021  ? Adjustment disorder with depressed mood 02/08/2021  ? Acute ethmoidal sinusitis 02/08/2021  ? Acquired absence of both cervix and uterus 02/08/2021  ? Abnormal vaginal bleeding 02/08/2021  ? S/P TAH (total abdominal hysterectomy) 03/10/2015  ? Vasovagal syncope 07/07/2014  ? Essential hypertension 07/07/2014  ? ? ?REFERRING DIAG:  ?M54.42,G89.29 (ICD-10-CM) - Chronic left-sided low back pain with left-sided sciatica ? ?THERAPY DIAG:  ?Other low back pain ? ?Muscle weakness (generalized) ? ?PERTINENT HISTORY:  ?HTN ? ?PRECAUTIONS:  ?None ? ?SUBJECTIVE:  ?Pt presents to PT with no reports of pain or discomfort. Pt has been compliant with HEP with no adverse effect. She is ready to begin PT at this time. ? ?Pain: ?Are you having pain?  ?No: NPRS scale: 0/10 ?Pain location: L hip ?Pain description: dull ache ?Aggravating factors: bending, lifting ?Relieving factors: medication, heat ? ?OBJECTIVE:  ?  ?  PATIENT SURVEYS:  ?FOTO 40% function; 59% predicted ?  ?LE MMT: ?  ?MMT Right ?06/19/2021 Left ?06/19/2021  ?Hip flexion  5/5 3+/5  ?Hip extension      ?Hip abduction 5/5 3+/5  ?Hip adduction 5/5    ?Hip external rotation      ?Hip internal rotation      ?Knee extension 5/5    ?Knee flexion      ?Ankle dorsiflexion  5/5 4/5  ?Ankle plantarflexion 5/5 4/5  ?Ankle inversion      ?Ankle eversion      ?Grossly      ?(Blank rows = not tested) ?**L LE myotomes global 4/5 ?  ?FUNCTIONAL TESTS:  ?30  Second Sit to Stand: 8 reps ?  ?TODAY'S TREATMENT  ?[facility] Adult PT Treatment: DATE: 07/24/2021 ?Therapeutic Exercise: ?NuStep lvl 6 LE only x 3 min while taking subjective ?Mini squat 2x10 ?Bridge with clamshell 2x15 GTB ?Supine 90/90 4x15" ?STS no UE support 2x10 - 10# KB ?Paloff press 2x10 7# ?Lateral walk at counter x 3 laps RTB ?Standing hip abd/ext 2x15 RTB ?Modalities: ?MHP to L hip x 10 min post session ? ?[facility] Adult PT Treatment: DATE: 07/10/2021 ?Therapeutic Exercise: ?NuStep lvl 6 LE only x 4 min while taking subjective ?Bridge 2x15  ?Sidelying clamshell RTB 2x10 ?Supine PPT 2x10 - 5" ?Supine PPT w/ ball squeeze 2x10 ?Supine SLR 2x10 each ?Supine sciatic nerve glide x 15 L ?Supine piriformis stretch L 2x30" ?STS no UE support 2x10 ?Lateral walk at counter x 3 laps RTB ?Standing hip abd/ext 2x10 RTB ?Modalities: ?MHP to L hip x 10 min post session ? ?[facility] Adult PT Treatment: DATE: 07/03/2021 ?Therapeutic Exercise: ?NuStep lvl 5 LE only x 5 min while taking subjective ?Bridge 2x10  ?Sidelying clamshell RTB 2x10 ?Supine PPT 2x10 - 5" ?Supine SLR 2x10 each ?Supine sciatic nerve glide x 15 L ?Supine piriformis stretch L 2x30" ?STS no UE support 2x10 ?Lateral walk at counter x 3 laps RTB ?Standing hip abd/ext 2x10 RTB ? ?[facility] Adult PT Treatment: DATE: 06/19/2021 ?Therapeutic Exercise: ?Seated sciatic nerve glide x 10 L ?Bridge x 10  ?Supine piriformis stretch L x 30" ?Supine clamshell GTB x 10 ?  ?PATIENT EDUCATION:  ?Education details: continue HEP ?Person educated: Patient ?Education method: Explanation, Demonstration, and Handouts ?Education comprehension: verbalized understanding and returned demonstration ?  ?HOME EXERCISE PROGRAM: ?Access Code: [email or web address] ?URL: [email or web address] ?Date: 07/10/2021 ?Prepared by: Octavio Manns ? ?Exercises ?- Seated Sciatic Tensioner  - 1 x daily - 7 x weekly - 2 sets - 10 reps ?- Supine Bridge  - 1 x daily - 7 x weekly - 2 sets - 10 reps ?- Supine Piriformis Stretch  with Leg Straight  - 1 x daily - 7 x weekly - 3 reps - 30 sec hold ?- Hooklying Clamshell with Resistance  - 1 x daily - 7 x weekly - 3 sets - 15 reps ?- Supine Posterior Pelvic Tilt  - 1 x daily - 7 x weekly - 2 sets - 10 reps - 5 sec hold ?- Standing Hip Abduction with Resistance at Ankles and Counter Support  - 1 x daily - 7 x weekly - 2 sets - 10 reps ?- Standing Hip Extension with Resistance at Ankles and Counter Support  - 1 x daily - 7 x weekly - 2 sets - 10 reps ?  ?ASSESSMENT: ?  ?CLINICAL IMPRESSION: ?Pt was able to complete all prescribed exercises with no adverse effect or increase in discomfort. Therapy  today worked on progressing core and proximal hip strength in order to decrease pain and improve functional ability. She continues to progress very well with therapy and will continue to be seen and progressed as tolerated per POC.  ?  ?  ?OBJECTIVE IMPAIRMENTS decreased endurance, decreased mobility, difficulty walking, decreased strength, and pain.  ?  ?ACTIVITY LIMITATIONS community activity, driving, and occupation.  ?  ?PERSONAL FACTORS 1 comorbidity: HTN  are also affecting patient's functional outcome.  ?  ?  ?GOALS: ?Goals reviewed with patient? No ?  ?SHORT TERM GOALS: Target date: 07/10/2021 ?  ?Pt will be compliant and knowledgeable with initial HEP for improved comfort and carryover ?Baseline: initial HEP given  ?Goal status: INITIAL ?  ?2.  Pt will self report lower back and L hip pain no greater than 5/10 for improved comfort and functional ability ?Baseline: 7/10 at worst ?Goal status: INITIAL ?  ?  ?LONG TERM GOALS: Target date: 08/14/2021 ?  ?Pt will self report lower back and L hip pain no greater than 2/10 for improved comfort and functional ability ?Baseline: 7/10 at worst ?Goal status: INITIAL ?  ?2.  Pt will improve FOTO function score to no less than 59% as proxy for functional improvement ?Baseline: 40% function ?Goal status: INITIAL ?  ?3.  Pt will increase 30 Second Sit to Stand  rep count to no less than 10 reps for improved balance, strength, and functional mobility ?Baseline: 8 reps  ?Goal status: INITIAL ?  ?4.  Pt will be able to stand at work for >72mn without increase i

## 2021-08-07 ENCOUNTER — Ambulatory Visit: Payer: 59

## 2021-08-07 DIAGNOSIS — M5459 Other low back pain: Secondary | ICD-10-CM | POA: Diagnosis not present

## 2021-08-07 DIAGNOSIS — M6281 Muscle weakness (generalized): Secondary | ICD-10-CM | POA: Diagnosis not present

## 2021-08-07 NOTE — Therapy (Signed)
OUTPATIENT PHYSICAL THERAPY TREATMENT NOTE/DISCHARGE  PHYSICAL THERAPY DISCHARGE SUMMARY  Visits from Start of Care: 5  Current functional level related to goals / functional outcomes: See goals and objective   Remaining deficits: See goals and objective   Education / Equipment: HEP   Patient agrees to discharge. Patient goals were met. Patient is being discharged due to being pleased with the current functional level.   Patient Name: Heather Rios MRN: [id number] DOB:[birth year], 56 y.o., female Today's Date: 08/07/2021  PCP: Maury Dus, MD REFERRING PROVIDER: Aundra Dubin, PA-C  END OF SESSION:   PT End of Session - 08/07/21 0913     Visit Number 5    Number of Visits 17    Date for PT Re-Evaluation 08/14/21    PT Start Time 0915    PT Stop Time 0953    PT Time Calculation (min) 38 min    Activity Tolerance Patient tolerated treatment well;No increased pain    Behavior During Therapy [facility] for tasks assessed/performed                Past Medical History:  Diagnosis Date   Allergic rhinitis    Anemia    Anginal pain ([place])    Chest Pains 8/16   Eczema    Environmental allergies    Essential hypertension 07/07/2014   GERD (gastroesophageal reflux disease)    Heart murmur    High cholesterol    Hyperlipidemia 07/07/2014   Hypertension    Hypoglycemia    Hypoglycemia    Plantar fasciitis    S/P TAH (total abdominal hysterectomy) 03/10/2015   Sinusitis, chronic    Vasovagal syncope 07/07/2014   Past Surgical History:  Procedure Laterality Date   ABDOMINAL HYSTERECTOMY N/A 03/10/2015   Procedure: HYSTERECTOMY ABDOMINAL;  Surgeon: Thurnell Lose, MD;  Location: [place] [facility];  Service: Gynecology;  Laterality: N/A;   BILATERAL SALPINGECTOMY Bilateral 03/10/2015   pt denies   CHOLECYSTECTOMY     FOOT SURGERY     Patient Active Problem List   Diagnosis Date Noted   Mild mitral regurgitation 05/01/2021   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 02/08/2021   Family  history of ischemic heart disease (IHD) 02/08/2021   Gastro-esophageal reflux disease without esophagitis 02/08/2021   Heart murmur 02/08/2021   Internal hemorrhoids 02/08/2021   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   Thyromegaly 02/08/2021   Uterine fibroid 02/08/2021   Sciatica, right side 02/08/2021   Pure hypercholesterolemia 02/08/2021   Prediabetes 02/08/2021   Plantar fasciitis of left foot 02/08/2021   Palpitations 02/08/2021   Oral herpes simplex infection 02/08/2021   Menopause 02/08/2021   Menometrorrhagia 02/08/2021   Low back pain 02/08/2021   History of psychiatric disorder 02/08/2021   Encounter for surveillance of other contraceptives 02/08/2021   Eczema 02/08/2021   Dysuria 02/08/2021   Dysmenorrhea 02/08/2021   Carpal tunnel syndrome of left wrist 02/08/2021   Anxiety 02/08/2021   Allergy to eggs 02/08/2021   Allergic rhinitis 02/08/2021   Allergic conjunctivitis 02/08/2021   Adjustment disorder with depressed mood 02/08/2021   Acute ethmoidal sinusitis 02/08/2021   Acquired absence of both cervix and uterus 02/08/2021   Abnormal vaginal bleeding 02/08/2021   S/P TAH (total abdominal hysterectomy) 03/10/2015   Vasovagal syncope 07/07/2014   Essential hypertension 07/07/2014    REFERRING DIAG:  M54.42,G89.29 (ICD-10-CM) - Chronic left-sided low back pain with left-sided sciatica  THERAPY DIAG:  Other low back pain  Muscle weakness (generalized)  PERTINENT HISTORY:  HTN  PRECAUTIONS:  None  SUBJECTIVE:  Pt presents to PT with reports of slight L flank pain in her trunk, believes she may have slept on it wrong. She has been compliant with HEP with no adverse effect. She is ready to begin PT at this time.   Pain: Are you having pain?  No: NPRS scale: 0/10 Pain location: L hip Pain description: dull ache Aggravating factors: bending, lifting Relieving factors: medication, heat  OBJECTIVE:    PATIENT SURVEYS:  FOTO: 75% function -  08/07/2021   LE MMT:   MMT Right 06/19/2021 Left 06/19/2021  Hip flexion  5/5 3+/5  Hip extension      Hip abduction 5/5 3+/5  Hip adduction 5/5    Hip external rotation      Hip internal rotation      Knee extension 5/5    Knee flexion      Ankle dorsiflexion  5/5 4/5  Ankle plantarflexion 5/5 4/5  Ankle inversion      Ankle eversion      Grossly      (Blank rows = not tested) **L LE myotomes global 4/5   FUNCTIONAL TESTS:  30 Second Sit to Stand: 9 reps   TODAY'S TREATMENT  [facility] Adult PT Treatment: DATE: 08/07/2021 Therapeutic Exercise: NuStep lvl 6 LE only x 4 min while taking subjective STS x 10 Seated sciatic nerve glide x 10 L Bridge x 15 Supine PPT x 10 - 5" hold Supine 90/90 4x15" Supine piriformis stretch L x 30"  Lateral walk GTB x 3 laps at counter Standing hip abd/ext 2x15 GTB Therapeutic Activity: Assessment of tests/measures, goals and outcomes for discharge Modalities: MHP to L hip x 10 min post session  [facility] Adult PT Treatment: DATE: 07/24/2021 Therapeutic Exercise: NuStep lvl 6 LE only x 3 min while taking subjective Mini squat 2x10 Bridge with clamshell 2x15 GTB Supine 90/90 4x15" STS no UE support 2x10 - 10# KB Paloff press 2x10 7# Lateral walk at counter x 3 laps RTB Standing hip abd/ext 2x15 RTB Modalities: MHP to L hip x 10 min post session  [facility] Adult PT Treatment: DATE: 07/10/2021 Therapeutic Exercise: NuStep lvl 6 LE only x 4 min while taking subjective Bridge 2x15  Sidelying clamshell RTB 2x10 Supine PPT 2x10 - 5" Supine PPT w/ ball squeeze 2x10 Supine SLR 2x10 each Supine sciatic nerve glide x 15 L Supine piriformis stretch L 2x30" STS no UE support 2x10 Lateral walk at counter x 3 laps RTB Standing hip abd/ext 2x10 RTB Modalities: MHP to L hip x 10 min post session    PATIENT EDUCATION:  Education details: continue HEP Person educated: Patient Education method: Explanation, Demonstration, and Handouts Education  comprehension: verbalized understanding and returned demonstration   HOME EXERCISE PROGRAM: Access Code: [email or web address] URL: [email or web address] Date: 08/07/2021 Prepared by: Octavio Manns  Exercises - Child's Pose with Sidebending  - 3-4 x weekly - 2-3 reps - 20-30 sec hold - Supine Piriformis Stretch with Leg Straight  - 3-4 x weekly - 3 reps - 30 sec hold - Seated Sciatic Tensioner  - 3-4 x weekly - 2 sets - 10 reps - Sit to Stand Without Arm Support  - 3-4 x weekly - 3 sets - 10 reps - Supine Bridge  - 3-4 x weekly - 2 sets - 15 reps - Supine Posterior Pelvic Tilt  - 3-4 x weekly - 2 sets - 10 reps - 5 sec hold - Supine 90/90 Abdominal Bracing  - 3-4 x weekly - 2-3  sets - 10 reps - 10-15 sec hold - Standing Hip Abduction with Resistance at Ankles and Counter Support  - 3-4 x weekly - 2 sets - 10 reps - Standing Hip Extension with Resistance at Ankles and Counter Support  - 3-4 x weekly - 2 sets - 10 reps - Side Stepping with Resistance at Ankles and Counter Support  - 3-4 x weekly - 4 reps - green tand hold   ASSESSMENT:   CLINICAL IMPRESSION: Pt was able to complete all prescribed exercises and demonstrated knowledge of HEP with no adverse effect. Over the course of PT treatment pt has progressed very well, decreasing reports of pain and improving functional hip strength and mobility. Her FOTO score greatly improved, indicating improved subjective functional ability in the performance of work duties and home ADLs. Pt should continue to improve with HEP compliance and is being discharged from skilled PT at this time.      OBJECTIVE IMPAIRMENTS decreased endurance, decreased mobility, difficulty walking, decreased strength, and pain.    ACTIVITY LIMITATIONS community activity, driving, and occupation.    PERSONAL FACTORS 1 comorbidity: HTN  are also affecting patient's functional outcome.      GOALS: Goals reviewed with patient? No   SHORT TERM GOALS: Target date:  07/10/2021   Pt will be compliant and knowledgeable with initial HEP for improved comfort and carryover Baseline: initial HEP given  Goal status: MET   2.  Pt will self report lower back and L hip pain no greater than 5/10 for improved comfort and functional ability Baseline: 7/10 at worst Goal status: MET     LONG TERM GOALS: Target date: 08/14/2021   Pt will self report lower back and L hip pain no greater than 2/10 for improved comfort and functional ability Baseline: 7/10 at worst Goal status: MOSTLY MET   2.  Pt will improve FOTO function score to no less than 59% as proxy for functional improvement Baseline: 40% function 08/07/2021: 75% function Goal status: MET   3.  Pt will increase 30 Second Sit to Stand rep count to no less than 10 reps for improved balance, strength, and functional mobility Baseline: 8 reps  08/07/2021: 9 reps Goal status: MOSTLY MET   4.  Pt will be able to stand at work for >75mn without increase in lower back pain for improved comfort with work and home ADLs Baseline: 15 min Goal status: MET     PLAN: PT FREQUENCY: 1-2x/week   PT DURATION: 8 weeks   PLANNED INTERVENTIONS: Therapeutic exercises, Therapeutic activity, Neuromuscular re-education, Balance training, Gait training, Patient/Family education, Joint mobilization, Dry Needling, Electrical stimulation, and Manual therapy.   PLAN FOR NEXT SESSION: assess HEP response, progress core and hip strengthening, TPDN?     DWard Chatters PT 08/07/2021, 9:57 AM

## 2021-09-02 ENCOUNTER — Other Ambulatory Visit (HOSPITAL_COMMUNITY): Payer: Self-pay

## 2021-09-20 ENCOUNTER — Other Ambulatory Visit (HOSPITAL_COMMUNITY): Payer: Self-pay

## 2021-09-20 DIAGNOSIS — N898 Other specified noninflammatory disorders of vagina: Secondary | ICD-10-CM | POA: Diagnosis not present

## 2021-09-20 DIAGNOSIS — N952 Postmenopausal atrophic vaginitis: Secondary | ICD-10-CM | POA: Diagnosis not present

## 2021-09-20 DIAGNOSIS — Z90711 Acquired absence of uterus with remaining cervical stump: Secondary | ICD-10-CM | POA: Diagnosis not present

## 2021-09-20 MED ORDER — ESTRADIOL 0.1 MG/GM VA CREA
TOPICAL_CREAM | VAGINAL | 0 refills | Status: DC
  Filled 2021-09-20: qty 42.5, 90d supply, fill #0

## 2021-09-22 ENCOUNTER — Other Ambulatory Visit: Payer: Self-pay | Admitting: Family Medicine

## 2021-09-22 DIAGNOSIS — Z1231 Encounter for screening mammogram for malignant neoplasm of breast: Secondary | ICD-10-CM

## 2021-09-26 ENCOUNTER — Other Ambulatory Visit (HOSPITAL_COMMUNITY): Payer: Self-pay

## 2021-09-28 ENCOUNTER — Other Ambulatory Visit (HOSPITAL_COMMUNITY): Payer: Self-pay

## 2021-10-03 ENCOUNTER — Other Ambulatory Visit (HOSPITAL_COMMUNITY): Payer: Self-pay

## 2021-10-03 MED ORDER — FLUCONAZOLE 150 MG PO TABS
ORAL_TABLET | ORAL | 0 refills | Status: DC
  Filled 2021-10-03: qty 1, 1d supply, fill #0

## 2021-10-04 ENCOUNTER — Other Ambulatory Visit (HOSPITAL_COMMUNITY): Payer: Self-pay

## 2021-10-04 MED ORDER — OMRON 3 SERIES BP MONITOR DEVI
0 refills | Status: DC
  Filled 2021-10-04: qty 1, 1d supply, fill #0

## 2021-10-18 ENCOUNTER — Other Ambulatory Visit (HOSPITAL_COMMUNITY): Payer: Self-pay

## 2021-10-19 ENCOUNTER — Other Ambulatory Visit (HOSPITAL_COMMUNITY): Payer: Self-pay

## 2021-10-23 ENCOUNTER — Ambulatory Visit
Admission: RE | Admit: 2021-10-23 | Discharge: 2021-10-23 | Disposition: A | Discharge status: Home / Self Care | Payer: 59 | Source: Ambulatory Visit | Attending: Family Medicine | Admitting: Family Medicine

## 2021-10-23 DIAGNOSIS — Z1231 Encounter for screening mammogram for malignant neoplasm of breast: Secondary | ICD-10-CM | POA: Diagnosis not present

## 2021-10-26 ENCOUNTER — Other Ambulatory Visit (HOSPITAL_COMMUNITY): Payer: Self-pay

## 2021-12-04 ENCOUNTER — Other Ambulatory Visit (HOSPITAL_COMMUNITY): Payer: Self-pay

## 2021-12-12 ENCOUNTER — Other Ambulatory Visit (HOSPITAL_COMMUNITY): Payer: Self-pay

## 2021-12-14 ENCOUNTER — Other Ambulatory Visit (HOSPITAL_COMMUNITY): Payer: Self-pay

## 2021-12-14 MED ORDER — PRAVASTATIN SODIUM 40 MG PO TABS
40.0000 mg | ORAL_TABLET | Freq: Every day | ORAL | 0 refills | Status: DC
  Filled 2021-12-14: qty 90, 90d supply, fill #0

## 2021-12-20 DIAGNOSIS — R399 Unspecified symptoms and signs involving the genitourinary system: Secondary | ICD-10-CM | POA: Diagnosis not present

## 2021-12-20 DIAGNOSIS — R3915 Urgency of urination: Secondary | ICD-10-CM | POA: Diagnosis not present

## 2021-12-25 ENCOUNTER — Other Ambulatory Visit (HOSPITAL_COMMUNITY): Payer: Self-pay

## 2021-12-25 MED ORDER — FLUCONAZOLE 150 MG PO TABS
ORAL_TABLET | ORAL | 0 refills | Status: AC
  Filled 2021-12-25: qty 2, 4d supply, fill #0

## 2022-01-05 ENCOUNTER — Other Ambulatory Visit (HOSPITAL_COMMUNITY): Payer: Self-pay

## 2022-01-05 MED ORDER — TYRVAYA 0.03 MG/ACT NA SOLN
NASAL | 3 refills | Status: DC
  Filled 2022-01-05 – 2022-01-24 (×2): qty 8.4, 30d supply, fill #0

## 2022-01-11 ENCOUNTER — Other Ambulatory Visit (HOSPITAL_COMMUNITY): Payer: Self-pay

## 2022-01-11 ENCOUNTER — Ambulatory Visit
Admission: RE | Admit: 2022-01-11 | Discharge: 2022-01-11 | Disposition: A | Discharge status: Home / Self Care | Payer: 59 | Source: Ambulatory Visit | Attending: Physician Assistant | Admitting: Physician Assistant

## 2022-01-11 VITALS — BP 158/94 | HR 67 | Temp 97.9°F | Resp 18

## 2022-01-11 DIAGNOSIS — R42 Dizziness and giddiness: Secondary | ICD-10-CM | POA: Diagnosis not present

## 2022-01-11 DIAGNOSIS — J0110 Acute frontal sinusitis, unspecified: Secondary | ICD-10-CM

## 2022-01-11 MED ORDER — FLUTICASONE PROPIONATE 50 MCG/ACT NA SUSP: 2.0000 | Freq: Every day | NASAL | 2 refills | Status: DC

## 2022-01-11 MED ORDER — MECLIZINE HCL 12.5 MG PO TABS
12.5000 mg | ORAL_TABLET | Freq: Three times a day (TID) | ORAL | 0 refills | Status: AC | PRN
Start: 2022-01-11 — End: ?

## 2022-01-11 NOTE — Discharge Instructions (Addendum)
Advised to take the Antivert every 6 hours on a regular basis to decrease the dizziness and off-balance sensation. Advised to take Flonase nasal spray, 2 sprays each nostril once daily to help decrease the ear and sinus congestion. Advised to follow-up with PCP or return to urgent care if symptoms fail to improve.

## 2022-01-11 NOTE — ED Triage Notes (Signed)
Pt is present today with c/o left ear pain, congestion, and dizziness. Pt sx started x3 days ago.

## 2022-01-11 NOTE — ED Provider Notes (Signed)
[facility]    CSN: [id number] Arrival date & time: 01/11/22  1549      History   Chief Complaint Chief Complaint  Patient presents with   Dizziness   Otalgia   Nasal Congestion    HPI Heather Rios is a 56 y.o. female.   This 56 year old female presents with ear pain and dizziness.  Patient indicates for the past 3 days she has been having some upper respiratory and sinus congestion with frontal and maxillary sinus pressure, mild rhinitis and postnasal drip with clear production.  She indicates she has also been having left ear pain and pressure.  She indicates that yesterday she started having some intermittent dizziness and off-balance sensation.  Indicates that it tends to be worse with positional changes but tends to improve when she is sitting still.  Patient indicates she has not had any fever, chills, headaches, nausea, or vomiting.   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   Thyromegaly 02/08/2021   Uterine fibroid 02/08/2021   Sciatica, right side 02/08/2021   Pure hypercholesterolemia 02/08/2021   Prediabetes 02/08/2021   Plantar  fasciitis of left foot 02/08/2021   Palpitations 02/08/2021   Oral herpes simplex infection 02/08/2021   Menopause 02/08/2021   Menometrorrhagia 02/08/2021   Low back pain 02/08/2021   History of psychiatric disorder 02/08/2021   Encounter for surveillance of other contraceptives 02/08/2021   Eczema 02/08/2021   Dysuria 02/08/2021   Dysmenorrhea 02/08/2021   Carpal tunnel syndrome of left wrist 02/08/2021   Anxiety 02/08/2021   Allergy to eggs 02/08/2021   Allergic rhinitis 02/08/2021   Allergic conjunctivitis 02/08/2021   Adjustment disorder with depressed mood 02/08/2021   Acute ethmoidal sinusitis 02/08/2021   Acquired absence of both cervix and uterus 02/08/2021   Abnormal vaginal bleeding 02/08/2021   S/P TAH (total abdominal hysterectomy) 03/10/2015   Vasovagal syncope 07/07/2014   Essential hypertension 07/07/2014    Past Surgical History:  Procedure Laterality Date   ABDOMINAL HYSTERECTOMY N/A 03/10/2015   Procedure: HYSTERECTOMY ABDOMINAL;  Surgeon: Thurnell Lose, MD;  Location: [place] [facility];  Service: Gynecology;  Laterality: N/A;   BILATERAL SALPINGECTOMY Bilateral 03/10/2015   pt denies   CHOLECYSTECTOMY     FOOT SURGERY      OB History   No obstetric history on file.      Home Medications    Prior to Admission medications   Medication Sig Start Date End Date Taking? Authorizing Provider  fluticasone (FLONASE) 50 MCG/ACT nasal spray Place 2 sprays into both nostrils daily. 01/11/22  Yes Nyoka Lint, PA-C  meclizine (ANTIVERT) 12.5  MG tablet Take 1 tablet (12.5 mg total) by mouth 3 (three) times daily as needed for dizziness. 01/11/22  Yes Nyoka Lint, PA-C  amLODipine (NORVASC) 5 MG tablet Take 1 tablet by mouth every morning 12/13/20     amLODipine (NORVASC) 5 MG tablet Take 1 tablet by mouth every morning. 07/12/21     azelastine (ASTELIN) 0.1 % nasal spray Inhale 1-2 sprays into each nostril twice a day as needed. 12/13/20     azelastine (ASTELIN) 0.1 %  nasal spray Place 1 to 2 puffs in each nostril twice a day as needed 06/07/21     azelastine (OPTIVAR) 0.05 % ophthalmic solution Apply 1 drop into affected eye(s) twice a day as needed 06/09/21     Blood Pressure Monitoring (OMRON 3 SERIES BP MONITOR) DEVI Use as directed 12/26/20   Edmon Crape, [facility]  Blood Pressure Monitoring (OMRON 3 SERIES BP MONITOR) DEVI Use as directed 10/04/21   Margie Ege, RPH  cetirizine (ZYRTEC) 10 MG tablet Take 10 mg by mouth at bedtime as needed for allergies.     [provider]  ciprofloxacin (CIPRO) 500 MG tablet Take 1 tablet by mouth every 12 hours for 10 days 04/25/21     cyclobenzaprine (FLEXERIL) 10 MG tablet Take 1/2 to 1 tablet by mouth at bedtime, as needed for muscle spasm. 12/13/20     cyclobenzaprine (FLEXERIL) 10 MG tablet Take 1/2 to 1  tablet by mouth at bedtime, as needed for muscle spasm. 07/12/21     dexlansoprazole (DEXILANT) 60 MG capsule Take 1 capsule by mouth once daily. 06/06/21   Mauri Pole, MD  diclofenac Sodium (VOLTAREN) 1 % GEL Apply to affected area(s) as directed as needed. 12/13/20     diclofenac Sodium (VOLTAREN) 1 % GEL Apply to affected areas as needed. 07/12/21     EPINEPHrine 0.3 mg/0.3 mL IJ SOAJ injection See admin instructions. 08/07/16   [provider]  estradiol (ESTRACE) 0.1 MG/GM vaginal cream Insert 1 g twice a week by vaginal route for 90 days. 09/20/21     famotidine (PEPCID) 20 MG tablet Take 1 tablet (20 mg total) by mouth at bedtime. 04/17/21   Mauri Pole, MD  famotidine (PEPCID) 40 MG tablet Take 1 tablet by mouth at nightly as directed. 06/08/21     Fexofenadine HCl (MUCINEX ALLERGY PO) Take 1,200 mg by mouth.     [provider]  fluconazole (DIFLUCAN) 150 MG tablet Take 1 tablet by mouth for 1 dose then  repeat in 7 days if needed 04/25/21     irbesartan (AVAPRO) 300 MG tablet Take 1 tablet by mouth every morning. 07/12/21     levocetirizine (XYZAL) 5 MG tablet Take 1 tablet (5 mg  total) by mouth every evening. 02/04/15   Blima Singer, NP  methocarbamol (ROBAXIN) 500 MG tablet Take 1 tablet  by mouth 2 times daily as needed. 06/06/21   Aundra Dubin, PA-C  metroNIDAZOLE (FLAGYL) 500 MG tablet Take 1 tablet by mouth twice daily 04/25/21   Shirline Frees, MD  montelukast (SINGULAIR) 10 MG tablet Take 1 tablet by mouth once a day 12/13/20     montelukast (SINGULAIR) 10 MG tablet Take 1 tablet by mouth once a day. 07/12/21     mupirocin ointment (BACTROBAN) 2 % Apply 1 application topically 2 (two) times daily. 04/27/16   Caryn Section Linden Dolin, PA-C  ofloxacin (OCUFLOX) 0.3 % ophthalmic solution  06/27/15   [provider]  Olopatadine HCl  0.2 % SOLN INSTILL 1 DROP INTO EACH EYE ONCE A DAY, AS NEEDED 05/15/17   [provider]  omeprazole (PRILOSEC) 40 MG capsule Take 1 capsule by mouth every morning 06/08/21     pravastatin (PRAVACHOL) 40 MG tablet Take 1 tablet by mouth once a day. 12/13/21     predniSONE (STERAPRED UNI-PAK 21 TAB) 10 MG (21) TBPK tablet Take as directed on package 06/06/21   Aundra Dubin, PA-C  sucralfate (CARAFATE) 1 g tablet Take 1 tablet (1 g total) by mouth 2 (two) times daily as needed. 04/17/21   Mauri Pole, MD  triamcinolone (NASACORT ALLERGY 24HR) 55 MCG/ACT AERO nasal inhaler Use 1 spray in each nostril twice a day 03/07/21     triamcinolone cream (KENALOG) 0.1 % Apply to affected area twice a day as needed for rash. 12/13/20     triamcinolone cream (KENALOG) 0.1 % Apply to affected areas twice daily as needed for rash. 07/12/21     valACYclovir (VALTREX) 1000 MG tablet Take 2 tablets by mouth every 12 hours for 1 day, per outbreak of fever blister. 12/13/20     valACYclovir (VALTREX) 1000 MG tablet Take 2 tablets by mouth every 12 hours for one day, per outbreak of fever blister. 07/12/21     Varenicline Tartrate (TYRVAYA) 0.03 MG/ACT SOLN Use 1 spray in each nostril twice daily. 12/29/21       Family History Family History   Problem Relation Age of Onset   Heart disease Mother    Heart failure Mother    Hyperlipidemia Mother    Heart attack Mother 28   Diabetes Father    Hyperlipidemia Father    Kidney failure Father        kidney transplant receipiant   Diabetes Brother    Diabetes Other    High blood pressure Other    Breast cancer Neg Hx    Colon cancer Neg Hx    Colon polyps Neg Hx    Esophageal cancer Neg Hx    Rectal cancer Neg Hx    Stomach cancer Neg Hx    Pancreatic cancer Neg Hx     Social History Social History   Tobacco Use   Smoking status: Never   Smokeless tobacco: Never  Vaping Use   Vaping Use: Never used  Substance Use Topics   Alcohol use: No   Drug use: No     Allergies   Contrast media [iodinated contrast media], Adhesive [tape], Amoxicillin-pot clavulanate, Codeine, Hydrochlorothiazide, Oxycodone-acetaminophen, Peanut oil, Strawberry extract, Eggs or egg-derived products, Hydrocodone bitartrate er, Latex, Nexium [esomeprazole], and Sudafed [pseudoephedrine hcl]   Review of Systems Review of Systems  HENT:  Positive for ear pain.   Neurological:  Positive for dizziness.     Physical Exam Triage Vital Signs ED Triage Vitals  Enc Vitals Group     BP 01/11/22 1614 (!) 158/94     Pulse Rate 01/11/22 1614 67     Resp 01/11/22 1614 18     Temp 01/11/22 1614 97.9 F (36.6 C)     Temp src --      SpO2 01/11/22 1614 96 %     Weight --      Height --      Head Circumference --      Peak Flow --      Pain Score 01/11/22 1613 0     Pain Loc --      Pain Edu? --  Excl. in GC? --    No data found.  Updated Vital Signs BP (!) 158/94   Pulse 67   Temp 97.9 F (36.6 C)   Resp 18   LMP 01/10/2015 (Exact Date)   SpO2 96%   Visual Acuity Right Eye Distance:   Left Eye Distance:   Bilateral Distance:    Right Eye Near:   Left Eye Near:    Bilateral Near:     Physical Exam Constitutional:      Appearance: Normal appearance.  HENT:     Right  Ear: Ear canal normal. Tympanic membrane is injected.     Left Ear: Ear canal normal. Tympanic membrane is injected.     Mouth/Throat:     Mouth: Mucous membranes are moist.     Pharynx: Oropharynx is clear.  Cardiovascular:     Rate and Rhythm: Normal rate and regular rhythm.     Heart sounds: Normal heart sounds.  Pulmonary:     Effort: Pulmonary effort is normal.     Breath sounds: Normal breath sounds and air entry. No wheezing, rhonchi or rales.  Lymphadenopathy:     Cervical: No cervical adenopathy.  Neurological:     Mental Status: She is alert.      UC Treatments / Results  Labs (all labs ordered are listed, but only abnormal results are displayed) Labs Reviewed - No data to display  EKG   Radiology No results found.  Procedures Procedures (including critical care time)  Medications Ordered in UC Medications - No data to display  Initial Impression / Assessment and Plan / UC Course  I have reviewed the triage vital signs and the nursing notes.  Pertinent labs & imaging results that were available during my care of the patient were reviewed by me and considered in my medical decision making (see chart for details).    Plan: 1.  The frontal sinusitis will be treated with the following: A.  Flonase nasal spray, 2 sprays each nostril once daily to decrease congestion. 2.  The dizziness will be treated with the following: A.  Antivert 12.5 mg every 6 hours to help decrease dizziness and off-balance sensation. 3.  Patient advised to watch positional changes as this should occur slowly to prevent dizziness. 2.  Advised follow-up PCP or return to urgent care if symptoms fail to improve. Final Clinical Impressions(s) / UC Diagnoses   Final diagnoses:  Acute non-recurrent frontal sinusitis  Dizziness and giddiness     Discharge Instructions      Advised to take the Antivert every 6 hours on a regular basis to decrease the dizziness and off-balance  sensation. Advised to take Flonase nasal spray, 2 sprays each nostril once daily to help decrease the ear and sinus congestion. Advised to follow-up with PCP or return to urgent care if symptoms fail to improve.    ED Prescriptions     Medication Sig Dispense Auth. Provider   meclizine (ANTIVERT) 12.5 MG tablet Take 1 tablet (12.5 mg total) by mouth 3 (three) times daily as needed for dizziness. 30 tablet Nyoka Lint, PA-C   fluticasone [facility]) 50 MCG/ACT nasal spray Place 2 sprays into both nostrils daily. 11.1 mL Nyoka Lint, PA-C      PDMP not reviewed this encounter.   Nyoka Lint, PA-C 01/11/22 1631

## 2022-01-17 ENCOUNTER — Other Ambulatory Visit (HOSPITAL_COMMUNITY): Payer: Self-pay

## 2022-01-17 DIAGNOSIS — E7800 Pure hypercholesterolemia, unspecified: Secondary | ICD-10-CM | POA: Diagnosis not present

## 2022-01-17 DIAGNOSIS — Z0000 Encounter for general adult medical examination without abnormal findings: Secondary | ICD-10-CM | POA: Diagnosis not present

## 2022-01-17 DIAGNOSIS — K219 Gastro-esophageal reflux disease without esophagitis: Secondary | ICD-10-CM | POA: Diagnosis not present

## 2022-01-17 DIAGNOSIS — R634 Abnormal weight loss: Secondary | ICD-10-CM | POA: Diagnosis not present

## 2022-01-17 DIAGNOSIS — B002 Herpesviral gingivostomatitis and pharyngotonsillitis: Secondary | ICD-10-CM | POA: Diagnosis not present

## 2022-01-17 DIAGNOSIS — T7808XA Anaphylactic reaction due to eggs, initial encounter: Secondary | ICD-10-CM | POA: Diagnosis not present

## 2022-01-17 DIAGNOSIS — J309 Allergic rhinitis, unspecified: Secondary | ICD-10-CM | POA: Diagnosis not present

## 2022-01-17 DIAGNOSIS — L309 Dermatitis, unspecified: Secondary | ICD-10-CM | POA: Diagnosis not present

## 2022-01-17 DIAGNOSIS — I10 Essential (primary) hypertension: Secondary | ICD-10-CM | POA: Diagnosis not present

## 2022-01-17 MED ORDER — CYCLOBENZAPRINE HCL 10 MG PO TABS
5.0000 mg | ORAL_TABLET | Freq: Every evening | ORAL | 1 refills | Status: DC | PRN
  Filled 2022-01-17: qty 20, 20d supply, fill #0

## 2022-01-17 MED ORDER — IRBESARTAN 300 MG PO TABS
300.0000 mg | ORAL_TABLET | Freq: Every morning | ORAL | 1 refills | Status: DC
  Filled 2022-01-17 – 2022-02-28 (×2): qty 90, 90d supply, fill #0
  Filled 2022-06-01: qty 90, 90d supply, fill #1

## 2022-01-17 MED ORDER — TRIAMCINOLONE ACETONIDE 0.1 % EX CREA
TOPICAL_CREAM | CUTANEOUS | 3 refills | Status: DC
  Filled 2022-01-17: qty 60, 30d supply, fill #0

## 2022-01-17 MED ORDER — FAMOTIDINE 40 MG PO TABS
40.0000 mg | ORAL_TABLET | Freq: Every day | ORAL | 3 refills | Status: DC
  Filled 2022-01-17: qty 90, 90d supply, fill #0

## 2022-01-17 MED ORDER — OMEPRAZOLE 40 MG PO CPDR
40.0000 mg | DELAYED_RELEASE_CAPSULE | Freq: Every morning | ORAL | 3 refills | Status: DC
  Filled 2022-01-17: qty 90, 90d supply, fill #0

## 2022-01-17 MED ORDER — EPINEPHRINE 0.3 MG/0.3ML IJ SOAJ
INTRAMUSCULAR | 1 refills | Status: DC
Start: 2022-01-17 — End: 2022-09-21
  Filled 2022-01-17: qty 2, 30d supply, fill #0

## 2022-01-17 MED ORDER — PRAVASTATIN SODIUM 40 MG PO TABS
40.0000 mg | ORAL_TABLET | Freq: Every day | ORAL | 3 refills | Status: DC
  Filled 2022-01-17 – 2022-03-22 (×2): qty 90, 90d supply, fill #0
  Filled 2022-06-18: qty 90, 90d supply, fill #1

## 2022-01-17 MED ORDER — AMLODIPINE BESYLATE 5 MG PO TABS
5.0000 mg | ORAL_TABLET | Freq: Every morning | ORAL | 1 refills | Status: DC
  Filled 2022-01-17: qty 90, 90d supply, fill #0
  Filled 2022-06-25: qty 90, 90d supply, fill #1

## 2022-01-17 MED ORDER — CELECOXIB 200 MG PO CAPS
200.0000 mg | ORAL_CAPSULE | Freq: Every day | ORAL | 2 refills | Status: AC
  Filled 2022-01-17: qty 30, 30d supply, fill #0

## 2022-01-17 MED ORDER — VALACYCLOVIR HCL 1 G PO TABS
2.0000 g | ORAL_TABLET | Freq: Two times a day (BID) | ORAL | 1 refills | Status: DC | PRN
  Filled 2022-01-17: qty 20, 5d supply, fill #0

## 2022-01-17 MED ORDER — AZELASTINE HCL 0.1 % NA SOLN
1.0000 | Freq: Two times a day (BID) | NASAL | 5 refills | Status: DC | PRN
  Filled 2022-01-17: qty 30, 50d supply, fill #0

## 2022-01-17 MED ORDER — AZELASTINE HCL 0.05 % OP SOLN
1.0000 [drp] | Freq: Two times a day (BID) | OPHTHALMIC | 5 refills | Status: DC | PRN
  Filled 2022-01-17: qty 6, 23d supply, fill #0
  Filled 2022-04-26: qty 6, 23d supply, fill #1

## 2022-01-24 ENCOUNTER — Other Ambulatory Visit (HOSPITAL_COMMUNITY): Payer: Self-pay

## 2022-02-22 DIAGNOSIS — R109 Unspecified abdominal pain: Secondary | ICD-10-CM | POA: Diagnosis not present

## 2022-02-22 DIAGNOSIS — R350 Frequency of micturition: Secondary | ICD-10-CM | POA: Diagnosis not present

## 2022-02-22 DIAGNOSIS — R1032 Left lower quadrant pain: Secondary | ICD-10-CM | POA: Diagnosis not present

## 2022-02-28 ENCOUNTER — Other Ambulatory Visit (HOSPITAL_COMMUNITY): Payer: Self-pay

## 2022-02-28 MED ORDER — FLUCONAZOLE 150 MG PO TABS
150.0000 mg | ORAL_TABLET | Freq: Once | ORAL | 0 refills | Status: AC
  Filled 2022-02-28: qty 2, 2d supply, fill #0

## 2022-02-28 MED ORDER — CIPROFLOXACIN HCL 500 MG PO TABS
500.0000 mg | ORAL_TABLET | Freq: Two times a day (BID) | ORAL | 0 refills | Status: DC
  Filled 2022-02-28: qty 20, 10d supply, fill #0

## 2022-02-28 MED ORDER — METRONIDAZOLE 500 MG PO TABS
500.0000 mg | ORAL_TABLET | Freq: Three times a day (TID) | ORAL | 0 refills | Status: DC
  Filled 2022-02-28: qty 30, 10d supply, fill #0

## 2022-03-02 ENCOUNTER — Other Ambulatory Visit (HOSPITAL_COMMUNITY): Payer: Self-pay

## 2022-03-22 ENCOUNTER — Other Ambulatory Visit (HOSPITAL_COMMUNITY): Payer: Self-pay

## 2022-04-04 ENCOUNTER — Ambulatory Visit: Payer: Commercial Managed Care - PPO | Admitting: Orthopaedic Surgery

## 2022-04-05 ENCOUNTER — Other Ambulatory Visit (HOSPITAL_COMMUNITY): Payer: Self-pay

## 2022-04-05 ENCOUNTER — Ambulatory Visit: Payer: Commercial Managed Care - PPO | Admitting: Orthopaedic Surgery

## 2022-04-05 ENCOUNTER — Encounter: Payer: Self-pay | Admitting: Orthopaedic Surgery

## 2022-04-05 DIAGNOSIS — M5442 Lumbago with sciatica, left side: Secondary | ICD-10-CM | POA: Diagnosis not present

## 2022-04-05 DIAGNOSIS — G8929 Other chronic pain: Secondary | ICD-10-CM | POA: Diagnosis not present

## 2022-04-05 MED ORDER — PREDNISONE 10 MG (21) PO TBPK
ORAL_TABLET | ORAL | 0 refills | Status: AC
  Filled 2022-04-05: qty 21, 6d supply, fill #0

## 2022-04-05 MED ORDER — CYCLOBENZAPRINE HCL 5 MG PO TABS
5.0000 mg | ORAL_TABLET | Freq: Three times a day (TID) | ORAL | 3 refills | Status: DC | PRN
  Filled 2022-04-05: qty 30, 10d supply, fill #0

## 2022-04-05 NOTE — Progress Notes (Signed)
Office Visit Note   Patient: Heather Rios           Date of Birth: 02-13-1966           MRN: [id number] Visit Date: 04/05/2022              Requested by: Maury Dus, MD [place],  [place] [address] PCP: Maury Dus, MD (Inactive)   Assessment & Plan: Visit Diagnoses:  1. Chronic left-sided low back pain with left-sided sciatica     Plan: Impression is recurrent left leg lumbar radiculopathy.  Treatment options were explained and she would like to try another steroid taper and will restart home exercises.  Muscle relaxer prescribed.  If symptoms persist she will contact us so that we can order MRI.  Follow-Up Instructions: Return if symptoms worsen or fail to improve.   Orders:  No orders of the defined types were placed in this encounter.  Meds ordered this encounter  Medications   predniSONE (STERAPRED UNI-PAK 21 TAB) 10 MG (21) TBPK tablet    Sig: Take as directed    Dispense:  21 tablet    Refill:  3   cyclobenzaprine (FLEXERIL) 5 MG tablet    Sig: Take 1-2 tablets (5-10 mg total) by mouth 3 (three) times daily as needed for muscle spasms.    Dispense:  30 tablet    Refill:  3      Procedures: No procedures performed   Clinical Data: No additional findings.   Subjective: Chief Complaint  Patient presents with   Lower Back - Pain    HPI Heather Rios returns today for recurrent lumbar radiculopathy.  We saw her in March 23 for the exact same problems and this improved and resolved with physical therapy and steroid taper.  She started having pain again back in November.  Denies any injuries or red flag symptoms.  Review of Systems   Objective: Vital Signs: LMP 01/10/2015 (Exact Date)   Physical Exam  Ortho Exam Examination left lower leg shows negative straight leg.  No hip symptoms. Specialty Comments:  No specialty comments available.  Imaging: No results found.   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   Thyromegaly 02/08/2021   Uterine fibroid 02/08/2021   Sciatica, right side 02/08/2021   Pure hypercholesterolemia 02/08/2021   Prediabetes 02/08/2021   Plantar fasciitis of left foot 02/08/2021   Palpitations 02/08/2021   Oral herpes simplex infection 02/08/2021   Menopause 02/08/2021   Menometrorrhagia 02/08/2021   Low back pain 02/08/2021   History of psychiatric disorder 02/08/2021   Encounter for surveillance of other contraceptives 02/08/2021   Eczema 02/08/2021   Dysuria 02/08/2021   Dysmenorrhea 02/08/2021   Carpal tunnel syndrome of left wrist 02/08/2021   Anxiety 02/08/2021   Allergy to eggs 02/08/2021   Allergic rhinitis 02/08/2021   Allergic conjunctivitis 02/08/2021   Adjustment disorder with depressed mood 02/08/2021   Acute ethmoidal sinusitis 02/08/2021   Acquired absence of both cervix and uterus 02/08/2021   Abnormal vaginal bleeding 02/08/2021   S/P TAH (total abdominal hysterectomy) 03/10/2015   Vasovagal syncope 07/07/2014   Essential hypertension 07/07/2014   Past Medical History:  Diagnosis Date   Allergic rhinitis    Anemia    Anginal pain (H[facility])    Chest Pains 8/16   Eczema    Environmental allergies    Essential hypertension 07/07/2014   GERD (gastroesophageal reflux disease)    Heart murmur    High cholesterol    Hyperlipidemia 07/07/2014   Hypertension    Hypoglycemia    Hypoglycemia    Plantar fasciitis    S/P TAH (total abdominal hysterectomy) 03/10/2015   Sinusitis, chronic    Vasovagal syncope 07/07/2014    Family History  Problem Relation Age of Onset   Heart disease Mother    Heart failure Mother     Hyperlipidemia Mother    Heart attack Mother 52   Diabetes Father    Hyperlipidemia Father    Kidney failure Father        kidney transplant receipiant   Diabetes Brother    Diabetes Other    High blood pressure Other    Breast cancer Neg Hx    Colon cancer Neg Hx    Colon polyps Neg Hx    Esophageal cancer Neg Hx    Rectal cancer Neg Hx    Stomach cancer Neg Hx    Pancreatic cancer Neg Hx     Past Surgical History:  Procedure Laterality Date   ABDOMINAL HYSTERECTOMY N/A 03/10/2015   Procedure: HYSTERECTOMY ABDOMINAL;  Surgeon: Thurnell Lose, MD;  Location: [facility];  Service: Gynecology;  Laterality: N/A;   BILATERAL SALPINGECTOMY Bilateral 03/10/2015   pt denies   CHOLECYSTECTOMY     FOOT SURGERY     Social History   Occupational History   Not on file  Tobacco Use   Smoking status: Never   Smokeless tobacco: Never  Vaping Use   Vaping Use: Never used  Substance and Sexual Activity   Alcohol use: No   Drug use: No   Sexual activity: Yes    Birth control/protection: Implant

## 2022-04-26 ENCOUNTER — Other Ambulatory Visit (HOSPITAL_COMMUNITY): Payer: Self-pay

## 2022-05-01 ENCOUNTER — Other Ambulatory Visit (HOSPITAL_COMMUNITY): Payer: Self-pay

## 2022-05-01 DIAGNOSIS — B3731 Acute candidiasis of vulva and vagina: Secondary | ICD-10-CM | POA: Diagnosis not present

## 2022-05-01 DIAGNOSIS — N898 Other specified noninflammatory disorders of vagina: Secondary | ICD-10-CM | POA: Diagnosis not present

## 2022-05-01 DIAGNOSIS — L292 Pruritus vulvae: Secondary | ICD-10-CM | POA: Diagnosis not present

## 2022-05-01 MED ORDER — NYSTATIN-TRIAMCINOLONE 100000-0.1 UNIT/GM-% EX OINT
TOPICAL_OINTMENT | CUTANEOUS | 0 refills | Status: DC
  Filled 2022-05-01: qty 15, 10d supply, fill #0

## 2022-05-01 MED ORDER — FLUCONAZOLE 150 MG PO TABS
ORAL_TABLET | ORAL | 0 refills | Status: DC
  Filled 2022-05-01: qty 1, 1d supply, fill #0

## 2022-05-02 ENCOUNTER — Other Ambulatory Visit (HOSPITAL_COMMUNITY): Payer: Self-pay

## 2022-05-31 ENCOUNTER — Encounter: Payer: Self-pay | Admitting: Orthopaedic Surgery

## 2022-05-31 ENCOUNTER — Other Ambulatory Visit (HOSPITAL_COMMUNITY): Payer: Self-pay

## 2022-05-31 ENCOUNTER — Ambulatory Visit: Payer: Commercial Managed Care - PPO | Admitting: Orthopaedic Surgery

## 2022-05-31 ENCOUNTER — Other Ambulatory Visit (INDEPENDENT_AMBULATORY_CARE_PROVIDER_SITE_OTHER): Payer: Commercial Managed Care - PPO

## 2022-05-31 DIAGNOSIS — G8929 Other chronic pain: Secondary | ICD-10-CM

## 2022-05-31 DIAGNOSIS — M25561 Pain in right knee: Secondary | ICD-10-CM

## 2022-05-31 MED ORDER — LIDOCAINE HCL 1 % IJ SOLN
2.0000 mL | INTRAMUSCULAR | Status: AC | PRN
  Administered 2022-05-31: 2 mL

## 2022-05-31 MED ORDER — METHYLPREDNISOLONE ACETATE 40 MG/ML IJ SUSP
40.0000 mg | INTRAMUSCULAR | Status: AC | PRN
  Administered 2022-05-31: 40 mg via INTRA_ARTICULAR

## 2022-05-31 MED ORDER — TRAMADOL HCL 50 MG PO TABS
50.0000 mg | ORAL_TABLET | Freq: Two times a day (BID) | ORAL | 2 refills | Status: DC | PRN
  Filled 2022-05-31: qty 30, 15d supply, fill #0

## 2022-05-31 MED ORDER — BUPIVACAINE HCL 0.5 % IJ SOLN
2.0000 mL | INTRAMUSCULAR | Status: AC | PRN
  Administered 2022-05-31: 2 mL via INTRA_ARTICULAR

## 2022-05-31 NOTE — Progress Notes (Signed)
Office Visit Note   Patient: Heather Rios           Date of Birth: Mar 11, 1966           MRN: [id number] Visit Date: 05/31/2022              Requested by: No referring provider defined for this encounter. PCP: Maury Dus, MD (Inactive)   Assessment & Plan: Visit Diagnoses:  1. Chronic pain of right knee     Plan: Impression is right knee OA flareup.  At this point, we have discussed various treatment options to include intra-articular cortisone injection for which she would like to proceed.  She will follow-up with [place] as needed.  Follow-Up Instructions: Return if symptoms worsen or fail to improve.   Orders:  Orders Placed This Encounter  Procedures   XR KNEE 3 VIEW RIGHT   Meds ordered this encounter  Medications   traMADol (ULTRAM) 50 MG tablet    Sig: Take 1 tablet (50 mg total) by mouth every 12 (twelve) hours as needed.    Dispense:  30 tablet    Refill:  2      Procedures: Large Joint Inj: R knee on 05/31/2022 9:39 PM Indications: pain Details: 22 G needle  Arthrogram: No  Medications: 40 mg methylPREDNISolone acetate 40 MG/ML; 2 mL lidocaine 1 %; 2 mL bupivacaine 0.5 % Consent was given by the patient. Patient was prepped and draped in the usual sterile fashion.       Clinical Data: No additional findings.   Subjective: Chief Complaint  Patient presents with   Right Knee - Pain    HPI patient is a pleasant 57 year old female who comes in today with right knee pain for the past 3 weeks or so.  She notes she was moving on 05/11/2022 where she was constantly going up and down a set of stairs as she is living on the second floor.  Majority of her pain is to the medial aspect.  Symptoms occur primarily with ambulating.  She does have associated swelling which is slightly improved over the past few weeks.  She denies any mechanical symptoms.  She has been taking Tylenol without significant relief.  No previous cortisone injections to the right knee.    Review of Systems as detailed in HPI.  All others reviewed and are negative.   Objective: Vital Signs: LMP 01/10/2015 (Exact Date)   Physical Exam well-developed well-nourished female no acute distress.  Alert and oriented x 3.  Ortho Exam right knee exam: Range of motion 0 to 120 degrees.  Mild tenderness to the medial joint line.  She does have slight tenderness along the MCL.  No pain with valgus or varus stress.  She is neurovascular intact distally.  Specialty Comments:  No specialty comments available.  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   Thyromegaly 02/08/2021   Uterine fibroid 02/08/2021   Sciatica, right side 02/08/2021   Pure hypercholesterolemia 02/08/2021   Prediabetes 02/08/2021   Plantar fasciitis of left foot 02/08/2021   Palpitations 02/08/2021  Oral herpes simplex infection 02/08/2021   Menopause 02/08/2021   Menometrorrhagia 02/08/2021   Low back pain 02/08/2021   History of psychiatric disorder 02/08/2021   Encounter for surveillance of other contraceptives 02/08/2021   Eczema 02/08/2021   Dysuria 02/08/2021   Dysmenorrhea 02/08/2021   Carpal tunnel syndrome of left wrist 02/08/2021   Anxiety 02/08/2021   Allergy to eggs 02/08/2021   Allergic rhinitis 02/08/2021   Allergic conjunctivitis 02/08/2021   Adjustment disorder with depressed mood 02/08/2021   Acute ethmoidal sinusitis 02/08/2021   Acquired absence of both cervix and uterus 02/08/2021   Abnormal vaginal bleeding  02/08/2021   S/P TAH (total abdominal hysterectomy) 03/10/2015   Vasovagal syncope 07/07/2014   Essential hypertension 07/07/2014   Past Medical History:  Diagnosis Date   Allergic rhinitis    Anemia    Anginal pain ([place])    Chest Pains 8/16   Eczema    Environmental allergies    Essential hypertension 07/07/2014   GERD (gastroesophageal reflux disease)    Heart murmur    High cholesterol    Hyperlipidemia 07/07/2014   Hypertension    Hypoglycemia    Hypoglycemia    Plantar fasciitis    S/P TAH (total abdominal hysterectomy) 03/10/2015   Sinusitis, chronic    Vasovagal syncope 07/07/2014    Family History  Problem Relation Age of Onset   Heart disease Mother    Heart failure Mother    Hyperlipidemia Mother    Heart attack Mother 58   Diabetes Father    Hyperlipidemia Father    Kidney failure Father        kidney transplant receipiant   Diabetes Brother    Diabetes Other    High blood pressure Other    Breast cancer Neg Hx    Colon cancer Neg Hx    Colon polyps Neg Hx    Esophageal cancer Neg Hx    Rectal cancer Neg Hx    Stomach cancer Neg Hx    Pancreatic cancer Neg Hx     Past Surgical History:  Procedure Laterality Date   ABDOMINAL HYSTERECTOMY N/A 03/10/2015   Procedure: HYSTERECTOMY ABDOMINAL;  Surgeon: Thurnell Lose, MD;  Location: [facility];  Service: Gynecology;  Laterality: N/A;   BILATERAL SALPINGECTOMY Bilateral 03/10/2015   pt denies   CHOLECYSTECTOMY     FOOT SURGERY     Social History   Occupational History   Not on file  Tobacco Use   Smoking status: Never   Smokeless tobacco: Never  Vaping Use   Vaping Use: Never used  Substance and Sexual Activity   Alcohol use: No   Drug use: No   Sexual activity: Yes    Birth control/protection: Implant

## 2022-06-11 ENCOUNTER — Other Ambulatory Visit (HOSPITAL_COMMUNITY): Payer: Self-pay

## 2022-06-11 DIAGNOSIS — Z91012 Allergy to eggs: Secondary | ICD-10-CM | POA: Diagnosis not present

## 2022-06-11 DIAGNOSIS — J069 Acute upper respiratory infection, unspecified: Secondary | ICD-10-CM | POA: Diagnosis not present

## 2022-06-11 MED ORDER — CEFUROXIME AXETIL 500 MG PO TABS
500.0000 mg | ORAL_TABLET | Freq: Two times a day (BID) | ORAL | 0 refills | Status: AC
  Filled 2022-06-11: qty 14, 7d supply, fill #0

## 2022-06-11 MED ORDER — IPRATROPIUM BROMIDE 0.03 % NA SOLN
2.0000 | Freq: Two times a day (BID) | NASAL | 0 refills | Status: DC
  Filled 2022-06-11: qty 30, 43d supply, fill #0

## 2022-06-11 MED ORDER — LORATADINE 10 MG PO TABS
10.0000 mg | ORAL_TABLET | Freq: Every day | ORAL | 0 refills | Status: AC
  Filled 2022-06-11: qty 30, 30d supply, fill #0

## 2022-06-11 MED ORDER — EPINEPHRINE 0.3 MG/0.3ML IJ SOAJ: INTRAMUSCULAR | 1 refills | Status: DC

## 2022-06-12 ENCOUNTER — Other Ambulatory Visit (HOSPITAL_COMMUNITY): Payer: Self-pay

## 2022-06-18 ENCOUNTER — Other Ambulatory Visit (HOSPITAL_COMMUNITY): Payer: Self-pay

## 2022-06-19 ENCOUNTER — Other Ambulatory Visit (HOSPITAL_COMMUNITY): Payer: Self-pay

## 2022-06-19 MED ORDER — FAMOTIDINE 40 MG PO TABS
40.0000 mg | ORAL_TABLET | Freq: Every day | ORAL | 0 refills | Status: DC
  Filled 2022-06-19: qty 90, 90d supply, fill #0

## 2022-06-20 ENCOUNTER — Other Ambulatory Visit (HOSPITAL_COMMUNITY): Payer: Self-pay

## 2022-06-20 MED ORDER — FLUCONAZOLE 150 MG PO TABS
150.0000 mg | ORAL_TABLET | ORAL | 0 refills | Status: DC
  Filled 2022-06-20: qty 2, 6d supply, fill #0

## 2022-06-21 ENCOUNTER — Other Ambulatory Visit (HOSPITAL_COMMUNITY): Payer: Self-pay

## 2022-06-21 MED ORDER — OMEPRAZOLE 40 MG PO CPDR
40.0000 mg | DELAYED_RELEASE_CAPSULE | Freq: Every morning | ORAL | 2 refills | Status: DC
  Filled 2022-06-21: qty 90, 90d supply, fill #0

## 2022-07-09 ENCOUNTER — Other Ambulatory Visit (HOSPITAL_COMMUNITY): Payer: Self-pay

## 2022-07-26 ENCOUNTER — Other Ambulatory Visit (HOSPITAL_COMMUNITY): Payer: Self-pay

## 2022-07-26 ENCOUNTER — Ambulatory Visit: Payer: Commercial Managed Care - PPO | Admitting: Nurse Practitioner

## 2022-07-26 DIAGNOSIS — E7800 Pure hypercholesterolemia, unspecified: Secondary | ICD-10-CM | POA: Diagnosis not present

## 2022-07-26 DIAGNOSIS — R7309 Other abnormal glucose: Secondary | ICD-10-CM | POA: Diagnosis not present

## 2022-07-26 DIAGNOSIS — I10 Essential (primary) hypertension: Secondary | ICD-10-CM | POA: Diagnosis not present

## 2022-07-26 MED ORDER — AMLODIPINE BESYLATE 5 MG PO TABS
5.0000 mg | ORAL_TABLET | Freq: Every morning | ORAL | 1 refills | Status: DC
  Filled 2022-07-26: qty 90, 90d supply, fill #0

## 2022-07-26 MED ORDER — IRBESARTAN 300 MG PO TABS
300.0000 mg | ORAL_TABLET | Freq: Every morning | ORAL | 1 refills | Status: DC
  Filled 2022-07-26 – 2022-08-24 (×2): qty 90, 90d supply, fill #0
  Filled 2022-11-16: qty 90, 90d supply, fill #1

## 2022-07-30 ENCOUNTER — Other Ambulatory Visit (HOSPITAL_COMMUNITY): Payer: Self-pay

## 2022-08-02 ENCOUNTER — Other Ambulatory Visit (HOSPITAL_COMMUNITY): Payer: Self-pay

## 2022-08-02 MED ORDER — ESTRADIOL 0.1 MG/GM VA CREA
1.0000 g | TOPICAL_CREAM | VAGINAL | 0 refills | Status: DC
  Filled 2022-08-02: qty 42.5, 90d supply, fill #0

## 2022-08-17 ENCOUNTER — Other Ambulatory Visit (HOSPITAL_COMMUNITY): Payer: Self-pay

## 2022-08-17 MED ORDER — ROSUVASTATIN CALCIUM 20 MG PO TABS
20.0000 mg | ORAL_TABLET | Freq: Every day | ORAL | 1 refills | Status: DC
  Filled 2022-08-17: qty 90, 90d supply, fill #0
  Filled 2022-11-16: qty 90, 90d supply, fill #1

## 2022-08-20 ENCOUNTER — Other Ambulatory Visit (HOSPITAL_COMMUNITY): Payer: Self-pay

## 2022-08-24 ENCOUNTER — Other Ambulatory Visit (HOSPITAL_COMMUNITY): Payer: Self-pay

## 2022-08-25 ENCOUNTER — Other Ambulatory Visit (HOSPITAL_COMMUNITY): Payer: Self-pay

## 2022-08-26 DIAGNOSIS — G5701 Lesion of sciatic nerve, right lower limb: Secondary | ICD-10-CM | POA: Diagnosis not present

## 2022-08-26 DIAGNOSIS — M5441 Lumbago with sciatica, right side: Secondary | ICD-10-CM | POA: Diagnosis not present

## 2022-09-12 ENCOUNTER — Other Ambulatory Visit: Payer: Self-pay | Admitting: Family Medicine

## 2022-09-12 DIAGNOSIS — Z1231 Encounter for screening mammogram for malignant neoplasm of breast: Secondary | ICD-10-CM

## 2022-09-18 ENCOUNTER — Ambulatory Visit: Payer: Commercial Managed Care - PPO | Admitting: Physician Assistant

## 2022-09-20 NOTE — Progress Notes (Signed)
Cardiology Office Note:    Date:  09/21/2022  ID:  Heather Rios, DOB 12/07/1965, MRN [id number] PCP: Elias Else, MD (Inactive)  [facility] Providers Cardiologist:  Donato Schultz, MD       Patient Profile:      Mitral regurgitation  TTE 04/09/19: EF 65-70, mild LVH, no RWMA, GLS -20.6, normal RVSF, trivial pericardial effusion, mild MR, trivial TR, trivial AI, mild AV sclerosis w/o AS, trivial PI  FHx CAD Hypertension  Hyperlipidemia  GERD  Hx of syncope in 2016 Workup unremarkable (Echocardiogram, stress test, sleep study normal) Hypokalemia - HCTZ DC'd       History of Present Illness:   Heather Rios is a 57 y.o. female who returns for follow up of HTN, MR, FHx of CAD. She was last senen in 04/2021.  She is here alone.  She is doing well without chest pain, shortness of breath, syncope.  She sleeps on an incline secondary to acid reflux.  She has some mild pedal edema on the left.  She has an old injury to her left ankle.  She does not smoke.  Review of Systems  Cardiovascular:  Negative for claudication.   see HPI    Studies Reviewed:   EKG Interpretation Date/Time:  Friday September 21 2022 09:22:02 EDT Ventricular Rate:  68 PR Interval:  160 QRS Duration:  84 QT Interval:  354 QTC Calculation: 376 R Axis:   47  Text Interpretation: Normal sinus rhythm Normal axis Nonspecific T wave abnormality No change when compared to prior ECGs Confirmed by Tereso Newcomer [phone]) on 09/21/2022 12:29:30 PM   Risk Assessment/Calculations:           Physical Exam:   VS:  BP 122/80   Pulse 68   Ht 5\' 1"  (1.549 m)   Wt 157 lb 12.8 oz (71.6 kg)   LMP 01/10/2015 (Exact Date)   SpO2 96%   BMI 29.82 kg/m    Wt Readings from Last 3 Encounters:  09/21/22 157 lb 12.8 oz (71.6 kg)  05/01/21 146 lb 9.6 oz (66.5 kg)  04/17/21 145 lb 2 oz (65.8 kg)    Constitutional:      Appearance: Healthy appearance. Not in distress.  Neck:     Vascular: No carotid bruit. JVD normal.   Pulmonary:     Breath sounds: Normal breath sounds. No wheezing. No rales.  Cardiovascular:     Normal rate. Regular rhythm.     Murmurs: There is no murmur.  Edema:    Peripheral edema absent.  Abdominal:     Palpations: Abdomen is soft.      ASSESSMENT AND PLAN:   Family history of ischemic heart disease (IHD) We again discussed coronary artery calcium score.  At this point, I do not think it would change management much as she is already on statin therapy.  However, it would be reasonable to proceed if she would like to do this.  She prefers to hold off.  We also discussed checking lipoprotein a.  She has labs pending with primary care in September and prefers to have this done at that time.  Essential hypertension Blood pressure is controlled.  Continue amlodipine 5 mg daily, irbesartan 300 mg daily.  Mild mitral regurgitation Mild MR by echo January 2021.  No symptoms to suggest worsening mitral regurgitation.  I do not hear a murmur on exam.  She will follow-up again in 1 year.  We can arrange follow-up echocardiogram at that time.  Pure  hypercholesterolemia Labs from primary care reviewed.  In May 2024, her LDL was above goal at 110.  She should at least have an LDL less than 100.  Her PCP increased changed her pravastatin to rosuvastatin.     Dispo:  Return in about 1 year (around 09/21/2023) for Routine Follow Up with Dr. Anne Fu, or Tereso Newcomer, PA-C.  Signed, Tereso Newcomer, PA-C

## 2022-09-21 ENCOUNTER — Encounter: Payer: Self-pay | Admitting: Physician Assistant

## 2022-09-21 ENCOUNTER — Ambulatory Visit: Payer: Commercial Managed Care - PPO | Attending: Nurse Practitioner | Admitting: Physician Assistant

## 2022-09-21 VITALS — BP 122/80 | HR 68 | Ht 61.0 in | Wt 157.8 lb

## 2022-09-21 DIAGNOSIS — I10 Essential (primary) hypertension: Secondary | ICD-10-CM | POA: Diagnosis not present

## 2022-09-21 DIAGNOSIS — E7800 Pure hypercholesterolemia, unspecified: Secondary | ICD-10-CM | POA: Diagnosis not present

## 2022-09-21 DIAGNOSIS — I340 Nonrheumatic mitral (valve) insufficiency: Secondary | ICD-10-CM | POA: Diagnosis not present

## 2022-09-21 DIAGNOSIS — Z8249 Family history of ischemic heart disease and other diseases of the circulatory system: Secondary | ICD-10-CM

## 2022-09-21 NOTE — Patient Instructions (Signed)
Medication Instructions:  Your physician recommends that you continue on your current medications as directed. Please refer to the Current Medication list given to you today.  *If you need a refill on your cardiac medications before your next appointment, please call your pharmacy*  Lab Work: Please ask primary care provider to draw lipoprotein A and fax results to our office at [phone].  Testing/Procedures: None ordered today.  Follow-Up: At [facility], you and your health needs are our priority.  As part of our continuing mission to provide you with exceptional heart care, we have created designated Provider Care Teams.  These Care Teams include your primary Cardiologist (physician) and Advanced Practice Providers (APPs -  Physician Assistants and Nurse Practitioners) who all work together to provide you with the care you need, when you need it.  Your next appointment:   1 year(s)  The format for your next appointment:   In Person  Provider:   Donato Schultz, MD  or Tereso Newcomer, PA-C

## 2022-09-21 NOTE — Assessment & Plan Note (Signed)
We again discussed coronary artery calcium score.  At this point, I do not think it would change management much as she is already on statin therapy.  However, it would be reasonable to proceed if she would like to do this.  She prefers to hold off.  We also discussed checking lipoprotein a.  She has labs pending with primary care in September and prefers to have this done at that time.

## 2022-09-21 NOTE — Assessment & Plan Note (Signed)
Blood pressure is controlled.  Continue amlodipine 5 mg daily, irbesartan 300 mg daily.

## 2022-09-21 NOTE — Assessment & Plan Note (Signed)
Labs from primary care reviewed.  In May 2024, her LDL was above goal at 110.  She should at least have an LDL less than 100.  Her PCP increased changed her pravastatin to rosuvastatin.

## 2022-09-21 NOTE — Assessment & Plan Note (Signed)
Mild MR by echo January 2021.  No symptoms to suggest worsening mitral regurgitation.  I do not hear a murmur on exam.  She will follow-up again in 1 year.  We can arrange follow-up echocardiogram at that time.

## 2022-10-05 ENCOUNTER — Other Ambulatory Visit (HOSPITAL_COMMUNITY): Payer: Self-pay

## 2022-10-08 ENCOUNTER — Other Ambulatory Visit (HOSPITAL_COMMUNITY): Payer: Self-pay

## 2022-10-08 DIAGNOSIS — M25562 Pain in left knee: Secondary | ICD-10-CM | POA: Diagnosis not present

## 2022-10-08 DIAGNOSIS — M25512 Pain in left shoulder: Secondary | ICD-10-CM | POA: Diagnosis not present

## 2022-10-08 DIAGNOSIS — R0781 Pleurodynia: Secondary | ICD-10-CM | POA: Diagnosis not present

## 2022-10-08 MED ORDER — MELOXICAM 15 MG PO TABS
15.0000 mg | ORAL_TABLET | Freq: Every day | ORAL | 0 refills | Status: DC
  Filled 2022-10-08: qty 10, 10d supply, fill #0

## 2022-10-08 MED ORDER — METHOCARBAMOL 750 MG PO TABS
750.0000 mg | ORAL_TABLET | Freq: Three times a day (TID) | ORAL | 0 refills | Status: DC
  Filled 2022-10-08: qty 30, 10d supply, fill #0

## 2022-10-09 ENCOUNTER — Other Ambulatory Visit (HOSPITAL_COMMUNITY): Payer: Self-pay

## 2022-10-09 MED ORDER — OMEPRAZOLE 40 MG PO CPDR
40.0000 mg | DELAYED_RELEASE_CAPSULE | Freq: Every morning | ORAL | 0 refills | Status: DC
  Filled 2022-10-09: qty 90, 90d supply, fill #0

## 2022-10-22 ENCOUNTER — Ambulatory Visit: Payer: Commercial Managed Care - PPO

## 2022-11-01 ENCOUNTER — Ambulatory Visit
Admission: RE | Admit: 2022-11-01 | Discharge: 2022-11-01 | Disposition: A | Discharge status: Home / Self Care | Payer: Commercial Managed Care - PPO | Source: Ambulatory Visit | Attending: Family Medicine | Admitting: Family Medicine

## 2022-11-01 ENCOUNTER — Ambulatory Visit: Payer: Commercial Managed Care - PPO

## 2022-11-01 DIAGNOSIS — Z1231 Encounter for screening mammogram for malignant neoplasm of breast: Secondary | ICD-10-CM | POA: Diagnosis not present

## 2022-11-02 ENCOUNTER — Other Ambulatory Visit (HOSPITAL_COMMUNITY): Payer: Self-pay

## 2022-11-02 DIAGNOSIS — J329 Chronic sinusitis, unspecified: Secondary | ICD-10-CM | POA: Diagnosis not present

## 2022-11-02 MED ORDER — CEFUROXIME AXETIL 500 MG PO TABS
500.0000 mg | ORAL_TABLET | Freq: Two times a day (BID) | ORAL | 0 refills | Status: DC
  Filled 2022-11-02: qty 14, 7d supply, fill #0

## 2022-11-02 MED ORDER — IPRATROPIUM BROMIDE 0.03 % NA SOLN
NASAL | 0 refills | Status: DC
  Filled 2022-11-02: qty 30, 30d supply, fill #0

## 2022-11-20 ENCOUNTER — Other Ambulatory Visit (HOSPITAL_COMMUNITY): Payer: Self-pay

## 2022-11-21 ENCOUNTER — Other Ambulatory Visit (HOSPITAL_COMMUNITY): Payer: Self-pay

## 2022-11-21 MED ORDER — AZELASTINE HCL 0.05 % OP SOLN
1.0000 [drp] | Freq: Two times a day (BID) | OPHTHALMIC | 3 refills | Status: AC | PRN
  Filled 2022-11-21: qty 6, 23d supply, fill #0
  Filled 2023-05-09: qty 6, 23d supply, fill #1

## 2022-11-29 DIAGNOSIS — E7800 Pure hypercholesterolemia, unspecified: Secondary | ICD-10-CM | POA: Diagnosis not present

## 2022-11-30 ENCOUNTER — Other Ambulatory Visit (HOSPITAL_COMMUNITY): Payer: Self-pay

## 2022-12-15 ENCOUNTER — Other Ambulatory Visit (HOSPITAL_COMMUNITY): Payer: Self-pay

## 2022-12-17 DIAGNOSIS — M2669 Other specified disorders of temporomandibular joint: Secondary | ICD-10-CM | POA: Diagnosis not present

## 2022-12-17 DIAGNOSIS — H93292 Other abnormal auditory perceptions, left ear: Secondary | ICD-10-CM | POA: Diagnosis not present

## 2022-12-17 DIAGNOSIS — H9202 Otalgia, left ear: Secondary | ICD-10-CM | POA: Diagnosis not present

## 2022-12-24 ENCOUNTER — Other Ambulatory Visit (HOSPITAL_COMMUNITY): Payer: Self-pay

## 2022-12-31 DIAGNOSIS — Z1331 Encounter for screening for depression: Secondary | ICD-10-CM | POA: Diagnosis not present

## 2022-12-31 DIAGNOSIS — Z01419 Encounter for gynecological examination (general) (routine) without abnormal findings: Secondary | ICD-10-CM | POA: Diagnosis not present

## 2023-01-12 ENCOUNTER — Other Ambulatory Visit (HOSPITAL_COMMUNITY): Payer: Self-pay

## 2023-01-14 ENCOUNTER — Other Ambulatory Visit (HOSPITAL_COMMUNITY): Payer: Self-pay

## 2023-01-14 MED ORDER — OMEPRAZOLE 40 MG PO CPDR
40.0000 mg | DELAYED_RELEASE_CAPSULE | Freq: Every day | ORAL | 0 refills | Status: DC
  Filled 2023-01-14: qty 90, 90d supply, fill #0

## 2023-01-15 ENCOUNTER — Other Ambulatory Visit (HOSPITAL_COMMUNITY): Payer: Self-pay

## 2023-01-30 ENCOUNTER — Other Ambulatory Visit (HOSPITAL_COMMUNITY): Payer: Self-pay

## 2023-01-30 DIAGNOSIS — Z91018 Allergy to other foods: Secondary | ICD-10-CM | POA: Diagnosis not present

## 2023-01-30 DIAGNOSIS — E7800 Pure hypercholesterolemia, unspecified: Secondary | ICD-10-CM | POA: Diagnosis not present

## 2023-01-30 DIAGNOSIS — Z0000 Encounter for general adult medical examination without abnormal findings: Secondary | ICD-10-CM | POA: Diagnosis not present

## 2023-01-30 DIAGNOSIS — K219 Gastro-esophageal reflux disease without esophagitis: Secondary | ICD-10-CM | POA: Diagnosis not present

## 2023-01-30 DIAGNOSIS — I10 Essential (primary) hypertension: Secondary | ICD-10-CM | POA: Diagnosis not present

## 2023-01-30 MED ORDER — EPINEPHRINE 0.3 MG/0.3ML IJ SOAJ
INTRAMUSCULAR | 1 refills | Status: AC
  Filled 2023-01-30: qty 2, 30d supply, fill #0

## 2023-02-05 ENCOUNTER — Other Ambulatory Visit (HOSPITAL_COMMUNITY): Payer: Self-pay

## 2023-02-07 ENCOUNTER — Other Ambulatory Visit (HOSPITAL_COMMUNITY): Payer: Self-pay

## 2023-02-07 DIAGNOSIS — L239 Allergic contact dermatitis, unspecified cause: Secondary | ICD-10-CM | POA: Diagnosis not present

## 2023-02-07 DIAGNOSIS — T7808XA Anaphylactic reaction due to eggs, initial encounter: Secondary | ICD-10-CM | POA: Diagnosis not present

## 2023-02-07 DIAGNOSIS — Z91018 Allergy to other foods: Secondary | ICD-10-CM | POA: Diagnosis not present

## 2023-02-07 MED ORDER — PREDNISONE 10 MG PO TABS
ORAL_TABLET | ORAL | 0 refills | Status: AC
  Filled 2023-02-07: qty 42, 12d supply, fill #0

## 2023-02-27 ENCOUNTER — Other Ambulatory Visit (HOSPITAL_COMMUNITY): Payer: Self-pay

## 2023-02-27 MED ORDER — IRBESARTAN 300 MG PO TABS
300.0000 mg | ORAL_TABLET | Freq: Every morning | ORAL | 1 refills | Status: DC
  Filled 2023-02-27: qty 90, 90d supply, fill #0
  Filled 2023-05-18: qty 90, 90d supply, fill #1

## 2023-03-02 ENCOUNTER — Other Ambulatory Visit (HOSPITAL_COMMUNITY): Payer: Self-pay

## 2023-03-04 ENCOUNTER — Other Ambulatory Visit (HOSPITAL_COMMUNITY): Payer: Self-pay

## 2023-03-05 ENCOUNTER — Other Ambulatory Visit (HOSPITAL_COMMUNITY): Payer: Self-pay

## 2023-03-05 MED ORDER — ROSUVASTATIN CALCIUM 20 MG PO TABS
20.0000 mg | ORAL_TABLET | Freq: Every day | ORAL | 1 refills | Status: DC
  Filled 2023-03-05: qty 90, 90d supply, fill #0
  Filled 2023-05-18: qty 90, 90d supply, fill #1

## 2023-03-06 ENCOUNTER — Other Ambulatory Visit (HOSPITAL_COMMUNITY): Payer: Self-pay

## 2023-03-11 ENCOUNTER — Other Ambulatory Visit (HOSPITAL_BASED_OUTPATIENT_CLINIC_OR_DEPARTMENT_OTHER): Payer: Self-pay

## 2023-03-11 ENCOUNTER — Other Ambulatory Visit (HOSPITAL_COMMUNITY): Payer: Self-pay

## 2023-03-11 DIAGNOSIS — R350 Frequency of micturition: Secondary | ICD-10-CM | POA: Diagnosis not present

## 2023-03-11 MED ORDER — CIPROFLOXACIN HCL 250 MG PO TABS
250.0000 mg | ORAL_TABLET | Freq: Two times a day (BID) | ORAL | 0 refills | Status: DC
  Filled 2023-03-11: qty 6, 3d supply, fill #0

## 2023-03-11 MED ORDER — FLUCONAZOLE 150 MG PO TABS
150.0000 mg | ORAL_TABLET | ORAL | 0 refills | Status: DC | PRN
  Filled 2023-03-11: qty 2, 2d supply, fill #0

## 2023-03-12 ENCOUNTER — Other Ambulatory Visit (HOSPITAL_COMMUNITY): Payer: Self-pay

## 2023-03-15 ENCOUNTER — Other Ambulatory Visit (HOSPITAL_COMMUNITY): Payer: Self-pay

## 2023-03-15 MED ORDER — NITROFURANTOIN MONOHYD MACRO 100 MG PO CAPS
100.0000 mg | ORAL_CAPSULE | Freq: Two times a day (BID) | ORAL | 0 refills | Status: DC
  Filled 2023-03-15: qty 14, 7d supply, fill #0

## 2023-03-19 ENCOUNTER — Ambulatory Visit: Payer: Self-pay | Admitting: Allergy & Immunology

## 2023-03-19 ENCOUNTER — Encounter: Payer: Self-pay | Admitting: Gastroenterology

## 2023-03-20 ENCOUNTER — Inpatient Hospital Stay (EMERGENCY_DEPARTMENT_HOSPITAL): Payer: Commercial Managed Care - PPO | Admitting: Anesthesiology

## 2023-03-20 ENCOUNTER — Encounter (HOSPITAL_COMMUNITY)
Admission: AD | Disposition: A | Discharge status: Home / Self Care | Payer: Self-pay | Source: Home / Self Care | Attending: Obstetrics & Gynecology

## 2023-03-20 ENCOUNTER — Other Ambulatory Visit: Payer: Self-pay

## 2023-03-20 ENCOUNTER — Encounter (HOSPITAL_COMMUNITY): Payer: Self-pay | Admitting: Obstetrics & Gynecology

## 2023-03-20 ENCOUNTER — Inpatient Hospital Stay (HOSPITAL_COMMUNITY): Payer: Commercial Managed Care - PPO | Admitting: Anesthesiology

## 2023-03-20 ENCOUNTER — Inpatient Hospital Stay (HOSPITAL_COMMUNITY)
Admission: AD | Admit: 2023-03-20 | Discharge: 2023-03-20 | Disposition: A | Discharge status: Home / Self Care | Payer: Commercial Managed Care - PPO | Attending: Obstetrics & Gynecology | Admitting: Obstetrics & Gynecology

## 2023-03-20 DIAGNOSIS — N930 Postcoital and contact bleeding: Secondary | ICD-10-CM | POA: Insufficient documentation

## 2023-03-20 DIAGNOSIS — I10 Essential (primary) hypertension: Secondary | ICD-10-CM | POA: Insufficient documentation

## 2023-03-20 DIAGNOSIS — K219 Gastro-esophageal reflux disease without esophagitis: Secondary | ICD-10-CM | POA: Diagnosis not present

## 2023-03-20 DIAGNOSIS — S3141XA Laceration without foreign body of vagina and vulva, initial encounter: Secondary | ICD-10-CM | POA: Diagnosis not present

## 2023-03-20 DIAGNOSIS — Z780 Asymptomatic menopausal state: Secondary | ICD-10-CM | POA: Insufficient documentation

## 2023-03-20 DIAGNOSIS — X58XXXA Exposure to other specified factors, initial encounter: Secondary | ICD-10-CM | POA: Diagnosis not present

## 2023-03-20 DIAGNOSIS — Z90710 Acquired absence of both cervix and uterus: Secondary | ICD-10-CM | POA: Insufficient documentation

## 2023-03-20 DIAGNOSIS — Z9079 Acquired absence of other genital organ(s): Secondary | ICD-10-CM | POA: Insufficient documentation

## 2023-03-20 HISTORY — PX: PERINEAL LACERATION REPAIR: SHX5389

## 2023-03-20 LAB — TYPE AND SCREEN
ABO/RH(D): B POS
Antibody Screen: NEGATIVE

## 2023-03-20 LAB — COMPREHENSIVE METABOLIC PANEL
ALT: 22 U/L (ref 0–44)
AST: 21 U/L (ref 15–41)
Albumin: 3.6 g/dL (ref 3.5–5.0)
Alkaline Phosphatase: 62 U/L (ref 38–126)
Anion gap: 8 (ref 5–15)
BUN: 11 mg/dL (ref 6–20)
CO2: 26 mmol/L (ref 22–32)
Calcium: 9.4 mg/dL (ref 8.9–10.3)
Chloride: 105 mmol/L (ref 98–111)
Creatinine, Ser: 0.87 mg/dL (ref 0.44–1.00)
GFR, Estimated: >60 mL/min (ref >60)
Glucose, Bld: 134 mg/dL — ABNORMAL HIGH (ref 70–99)
Potassium: 3.7 mmol/L (ref 3.5–5.1)
Sodium: 139 mmol/L (ref 135–145)
Total Bilirubin: <0.2 mg/dL (ref 0.0–1.2)
Total Protein: 6.9 g/dL (ref 6.5–8.1)

## 2023-03-20 LAB — CBC
HCT: 34.5 % — ABNORMAL LOW (ref 36.0–46.0)
HCT: 35.6 % — ABNORMAL LOW (ref 36.0–46.0)
Hemoglobin: 11.5 g/dL — ABNORMAL LOW (ref 12.0–15.0)
Hemoglobin: 11.4 g/dL — ABNORMAL LOW (ref 12.0–15.0)
MCH: 29.4 pg (ref 26.0–34.0)
MCH: 29.3 pg (ref 26.0–34.0)
MCHC: 33.3 g/dL (ref 30.0–36.0)
MCHC: 32 g/dL (ref 30.0–36.0)
MCV: 88.2 fL (ref 80.0–100.0)
MCV: 91.5 fL (ref 80.0–100.0)
Platelets: 419 10*3/uL — ABNORMAL HIGH (ref 150–400)
Platelets: 274 10*3/uL (ref 150–400)
RBC: 3.91 MIL/uL (ref 3.87–5.11)
RBC: 3.89 MIL/uL (ref 3.87–5.11)
RDW: 12.6 % (ref 11.5–15.5)
RDW: 12.7 % (ref 11.5–15.5)
WBC: 5.8 10*3/uL (ref 4.0–10.5)
WBC: 12.7 10*3/uL — ABNORMAL HIGH (ref 4.0–10.5)
nRBC: 0 % (ref 0.0–0.2)
nRBC: 0 % (ref 0.0–0.2)

## 2023-03-20 LAB — POCT I-STAT, CHEM 8
BUN: 10 mg/dL (ref 6–20)
Calcium, Ion: 1.23 mmol/L (ref 1.15–1.40)
Chloride: 107 mmol/L (ref 98–111)
Creatinine, Ser: 0.8 mg/dL (ref 0.44–1.00)
Glucose, Bld: 109 mg/dL — ABNORMAL HIGH (ref 70–99)
HCT: 34 % — ABNORMAL LOW (ref 36.0–46.0)
Hemoglobin: 11.6 g/dL — ABNORMAL LOW (ref 12.0–15.0)
Potassium: 4 mmol/L (ref 3.5–5.1)
Sodium: 144 mmol/L (ref 135–145)
TCO2: 22 mmol/L (ref 22–32)

## 2023-03-20 SURGERY — EXAM UNDER ANESTHESIA
Anesthesia: General

## 2023-03-20 MED ORDER — LIDOCAINE 2% (20 MG/ML) 5 ML SYRINGE
INTRAMUSCULAR | Status: AC
  Filled 2023-03-20: qty 5

## 2023-03-20 MED ORDER — LACTATED RINGERS IV SOLN: INTRAVENOUS | Status: DC | PRN

## 2023-03-20 MED ORDER — MIDAZOLAM HCL 2 MG/2ML IJ SOLN
INTRAMUSCULAR | Status: DC | PRN
  Administered 2023-03-20: 2 mg via INTRAVENOUS

## 2023-03-20 MED ORDER — DEXAMETHASONE SODIUM PHOSPHATE 10 MG/ML IJ SOLN
INTRAMUSCULAR | Status: AC
  Filled 2023-03-20: qty 1

## 2023-03-20 MED ORDER — SUGAMMADEX SODIUM 200 MG/2ML IV SOLN
INTRAVENOUS | Status: DC | PRN
  Administered 2023-03-20: 200 mg via INTRAVENOUS

## 2023-03-20 MED ORDER — SODIUM CHLORIDE 0.9 % IV SOLN: INTRAVENOUS | Status: DC | PRN

## 2023-03-20 MED ORDER — HYDROMORPHONE HCL 1 MG/ML IJ SOLN
INTRAMUSCULAR | Status: AC
  Filled 2023-03-20: qty 1

## 2023-03-20 MED ORDER — TRAMADOL HCL 50 MG PO TABS: 50.0000 mg | ORAL_TABLET | Freq: Four times a day (QID) | ORAL | 0 refills | Status: DC | PRN

## 2023-03-20 MED ORDER — SUCCINYLCHOLINE CHLORIDE 200 MG/10ML IV SOSY
PREFILLED_SYRINGE | INTRAVENOUS | Status: AC
  Filled 2023-03-20: qty 10

## 2023-03-20 MED ORDER — PHENYLEPHRINE 80 MCG/ML (10ML) SYRINGE FOR IV PUSH (FOR BLOOD PRESSURE SUPPORT)
PREFILLED_SYRINGE | INTRAVENOUS | Status: DC | PRN
  Administered 2023-03-20 (×2): 160 ug via INTRAVENOUS
  Administered 2023-03-20 (×2): 80 ug via INTRAVENOUS
  Administered 2023-03-20: 160 ug via INTRAVENOUS

## 2023-03-20 MED ORDER — 0.9 % SODIUM CHLORIDE (POUR BTL) OPTIME
TOPICAL | Status: DC | PRN
  Administered 2023-03-20: 1000 mL

## 2023-03-20 MED ORDER — SODIUM CHLORIDE 0.9 % IV BOLUS
1000.0000 mL | Freq: Once | INTRAVENOUS | Status: AC
  Administered 2023-03-20: 1000 mL via INTRAVENOUS

## 2023-03-20 MED ORDER — ONDANSETRON HCL 4 MG/2ML IJ SOLN
INTRAMUSCULAR | Status: DC | PRN
  Administered 2023-03-20: 4 mg via INTRAVENOUS

## 2023-03-20 MED ORDER — SCOPOLAMINE 1 MG/3DAYS TD PT72
MEDICATED_PATCH | TRANSDERMAL | Status: AC
  Filled 2023-03-20: qty 1

## 2023-03-20 MED ORDER — ROCURONIUM BROMIDE 10 MG/ML (PF) SYRINGE
PREFILLED_SYRINGE | INTRAVENOUS | Status: AC
  Filled 2023-03-20: qty 10

## 2023-03-20 MED ORDER — PHENYLEPHRINE 80 MCG/ML (10ML) SYRINGE FOR IV PUSH (FOR BLOOD PRESSURE SUPPORT)
PREFILLED_SYRINGE | INTRAVENOUS | Status: AC
  Filled 2023-03-20: qty 10

## 2023-03-20 MED ORDER — LIDOCAINE HCL 1 % IJ SOLN
INTRAMUSCULAR | Status: AC
  Filled 2023-03-20: qty 20

## 2023-03-20 MED ORDER — SODIUM CHLORIDE 0.9% FLUSH: 3.0000 mL | INTRAVENOUS | Status: DC | PRN

## 2023-03-20 MED ORDER — HYDROMORPHONE HCL 1 MG/ML IJ SOLN
1.0000 mg | Freq: Once | INTRAMUSCULAR | Status: AC
  Administered 2023-03-20: 1 mg via INTRAVENOUS
  Filled 2023-03-20: qty 1

## 2023-03-20 MED ORDER — CEFAZOLIN SODIUM 1 G IJ SOLR
INTRAMUSCULAR | Status: AC
Start: 2023-03-20 — End: ?
  Filled 2023-03-20: qty 20

## 2023-03-20 MED ORDER — FENTANYL CITRATE (PF) 250 MCG/5ML IJ SOLN
INTRAMUSCULAR | Status: AC
  Filled 2023-03-20: qty 5

## 2023-03-20 MED ORDER — LORAZEPAM 2 MG/ML IJ SOLN: 1.0000 mg | INTRAMUSCULAR | Status: DC

## 2023-03-20 MED ORDER — HYDROMORPHONE HCL 1 MG/ML IJ SOLN: 1.0000 mg | Freq: Once | INTRAMUSCULAR | Status: DC

## 2023-03-20 MED ORDER — ONDANSETRON HCL 4 MG/2ML IJ SOLN
INTRAMUSCULAR | Status: AC
  Filled 2023-03-20: qty 2

## 2023-03-20 MED ORDER — DEXAMETHASONE SODIUM PHOSPHATE 10 MG/ML IJ SOLN
INTRAMUSCULAR | Status: DC | PRN
  Administered 2023-03-20: 10 mg via INTRAVENOUS

## 2023-03-20 MED ORDER — SUCCINYLCHOLINE CHLORIDE 200 MG/10ML IV SOSY
PREFILLED_SYRINGE | INTRAVENOUS | Status: DC | PRN
  Administered 2023-03-20: 100 mg via INTRAVENOUS

## 2023-03-20 MED ORDER — LIDOCAINE 2% (20 MG/ML) 5 ML SYRINGE
INTRAMUSCULAR | Status: DC | PRN
  Administered 2023-03-20: 60 mg via INTRAVENOUS

## 2023-03-20 MED ORDER — MIDAZOLAM HCL 2 MG/2ML IJ SOLN
INTRAMUSCULAR | Status: AC
  Filled 2023-03-20: qty 2

## 2023-03-20 MED ORDER — FENTANYL CITRATE (PF) 250 MCG/5ML IJ SOLN
INTRAMUSCULAR | Status: DC | PRN
  Administered 2023-03-20: 100 ug via INTRAVENOUS

## 2023-03-20 MED ORDER — HYDROMORPHONE HCL 1 MG/ML IJ SOLN
0.2500 mg | INTRAMUSCULAR | Status: DC | PRN
  Administered 2023-03-20: 0.25 mg via INTRAVENOUS

## 2023-03-20 MED ORDER — PROPOFOL 10 MG/ML IV BOLUS
INTRAVENOUS | Status: DC | PRN
  Administered 2023-03-20: 140 mg via INTRAVENOUS

## 2023-03-20 MED ORDER — ROCURONIUM BROMIDE 10 MG/ML (PF) SYRINGE
PREFILLED_SYRINGE | INTRAVENOUS | Status: DC | PRN
  Administered 2023-03-20: 30 mg via INTRAVENOUS

## 2023-03-20 MED ORDER — CEFAZOLIN SODIUM-DEXTROSE 2-3 GM-%(50ML) IV SOLR
INTRAVENOUS | Status: DC | PRN
  Administered 2023-03-20: 2 g via INTRAVENOUS

## 2023-03-20 SURGICAL SUPPLY — 15 items
GLOVE BIO SURGEON STRL SZ 6.5 (GLOVE) ×1 IMPLANT
GLOVE BIOGEL PI IND STRL 7.0 (GLOVE) ×2 IMPLANT
GOWN STRL REUS W/ TWL LRG LVL3 (GOWN DISPOSABLE) ×2 IMPLANT
NDL SPNL 22GX3.5 QUINCKE BK (NEEDLE) ×1 IMPLANT
NEEDLE SPNL 22GX3.5 QUINCKE BK (NEEDLE) ×1 IMPLANT
NS IRRIG 1000ML POUR BTL (IV SOLUTION) ×1 IMPLANT
PACK VAGINAL MINOR WOMEN LF (CUSTOM PROCEDURE TRAY) ×1 IMPLANT
SCOPETTES 8 STERILE (MISCELLANEOUS) IMPLANT
SUT CHROMIC 4 0 SH 27 (SUTURE) IMPLANT
SUT VIC AB 3-0 SH 27X BRD (SUTURE) IMPLANT
SYR 50ML LL SCALE MARK (SYRINGE) IMPLANT
SYR CONTROL 10ML LL (SYRINGE) ×1 IMPLANT
TOWEL GREEN STERILE FF (TOWEL DISPOSABLE) ×2 IMPLANT
TUBE CONNECTING 12X1/4 (SUCTIONS) IMPLANT
YANKAUER SUCT BULB TIP NO VENT (SUCTIONS) IMPLANT

## 2023-03-20 NOTE — MAU Provider Note (Signed)
 Chief Complaint:  Vaginal Bleeding   Event Date/Time   First Provider Initiated Contact with Patient 03/20/23 1214     HPI: Heather Rios is a 58 y.o. G1P1001 non-pregnant female who presents to maternity admissions reporting heavy vaginal bleeding since 10:30. Pt had vaginal-penile sexual intercourse this morning. Recently in a new relationship and had not had sex for a while before this relationship. Has Hysterectomy in 2016 for fibroids. Cervix removed. Pt is post-menopausal.   Associated signs and symptoms: Neg for fever, chills, N/V/D/C, urinary complaints or vaginal discharge. Pos for mild cramping.   Pregnancy Course:   Past Medical History:  Diagnosis Date   Allergic rhinitis    Anemia    Anginal pain (HCC)    Chest Pains 8/16   Eczema    Environmental allergies    Essential hypertension 07/07/2014   GERD (gastroesophageal reflux disease)    Heart murmur    High cholesterol    Hyperlipidemia 07/07/2014   Hypertension    Hypoglycemia    Hypoglycemia    Plantar fasciitis    S/P TAH (total abdominal hysterectomy) 03/10/2015   Sinusitis, chronic    Vasovagal syncope 07/07/2014   OB History  Gravida Para Term Preterm AB Living  1 1 1   1   SAB IAB Ectopic Multiple Live Births      1    # Outcome Date GA Lbr Len/2nd Weight Sex Type Anes PTL Lv  1 Term 1988     Vag-Spont   LIV   Past Surgical History:  Procedure Laterality Date   ABDOMINAL HYSTERECTOMY N/A 03/10/2015   Procedure: HYSTERECTOMY ABDOMINAL;  Surgeon: Hargis Paradise, MD;  Location: [facility];  Service: Gynecology;  Laterality: N/A;   BILATERAL SALPINGECTOMY Bilateral 03/10/2015   pt denies   CHOLECYSTECTOMY     FOOT SURGERY     Family History  Problem Relation Age of Onset   Heart disease Mother    Heart failure Mother    Hyperlipidemia Mother    Heart attack Mother 92   Diabetes Father    Hyperlipidemia Father    Kidney failure Father        kidney transplant receipiant   Diabetes Brother     Diabetes Other    High blood pressure Other    Breast cancer Neg Hx    Colon cancer Neg Hx    Colon polyps Neg Hx    Esophageal cancer Neg Hx    Rectal cancer Neg Hx    Stomach cancer Neg Hx    Pancreatic cancer Neg Hx    Social History   Tobacco Use   Smoking status: Never   Smokeless tobacco: Never  Vaping Use   Vaping status: Never Used  Substance Use Topics   Alcohol use: No   Drug use: No   Allergies  Allergen Reactions   Contrast Media [Iodinated Contrast Media] Anaphylaxis   Adhesive [Tape] Other (See Comments)    Skin irritation   Amoxicillin-Pot Clavulanate Hives    Has patient had a PCN reaction causing immediate rash, facial/tongue/throat swelling, SOB or lightheadedness with hypotension: No Has patient had a PCN reaction causing severe rash involving mucus membranes or skin necrosis: No Has patient had a PCN reaction that required hospitalization No Has patient had a PCN reaction occurring within the last 10 years: Yes If all of the above answers are NO, then may proceed with Cephalosporin use.  Other reaction(s): hives   Codeine Other (See Comments)    Other reaction(s):  Unknown   Hydrochlorothiazide     Other reaction(s): Syncope   Oxycodone-Acetaminophen     Other reaction(s): hallucinate   Peanut Oil     Other reaction(s): Unknown   Strawberry Extract     Other reaction(s): hives   Egg-Derived Products Hives and Rash    Other reaction(s): Unknown   Hydrocodone Bitartrate Er Itching   Latex Itching and Rash   Nexium [Esomeprazole] Other (See Comments)    headahce   Sudafed [Pseudoephedrine Hcl] Palpitations   Medications Prior to Admission  Medication Sig Dispense Refill Last Dose/Taking   amLODipine  (NORVASC ) 5 MG tablet Take 1 tablet by mouth every morning 90 tablet 1    azelastine  (ASTELIN ) 0.1 % nasal spray Inhale 1-2 sprays into each nostril twice a day as needed. 30 mL 12    azelastine  (OPTIVAR ) 0.05 % ophthalmic solution Apply 1 drop  into affected eye(s) twice a day as needed 6 mL 2    azelastine  (OPTIVAR ) 0.05 % ophthalmic solution Place 1 drop into affected eye 2 (two) times daily as needed. 6 mL 3    Blood Pressure Monitoring (OMRON 3 SERIES BP MONITOR) DEVI Use as directed 1 each 0    cefUROXime  (CEFTIN ) 500 MG tablet Take 1 tablet (500 mg total) by mouth every 12 (twelve) hours for 7 days 14 tablet 0    celecoxib  (CELEBREX ) 200 MG capsule Take 1 capsule (200 mg total) by mouth daily with food, as needed for back pain. 30 capsule 2    cholecalciferol (VITAMIN D3) 25 MCG (1000 UNIT) tablet 1 tablet Orally Once a day for 30 day(s)      ciprofloxacin  (CIPRO ) 250 MG tablet Take 1 tablet (250 mg total) by mouth 2 (two) times daily. 6 tablet 0    cyclobenzaprine  (FLEXERIL ) 10 MG tablet Take 1/2 to 1 tablet by mouth at bedtime, as needed for muscle spasm. 30 tablet 1    diclofenac  Sodium (VOLTAREN ) 1 % GEL Apply to affected area(s) as directed as needed. 100 g 1    EPINEPHrine  0.3 mg/0.3 mL IJ SOAJ injection See admin instructions.  0    EPINEPHrine  0.3 mg/0.3 mL IJ SOAJ injection Inject as needed for anaphylaxis.SABRA must call 911 after 1 each 1    estradiol  (ESTRACE ) 0.1 MG/GM vaginal cream Insert 1 g twice a week by vaginal route for 90 days. 42.5 g 0    fluconazole  (DIFLUCAN ) 150 MG tablet Take 1 tablet (150 mg total) by mouth as needed for yeast, repeat at the end of the antibiotic if needed 2 tablet 0    ipratropium (ATROVENT ) 0.03 % nasal spray instill 2 sprays in each nostril twice a day 30 mL 0    irbesartan  (AVAPRO ) 300 MG tablet Take 1 tablet (300 mg total) by mouth every morning. 90 tablet 1    loratadine  (CLARITIN ) 10 MG tablet Take 1 tablet (10 mg total) by mouth daily. 30 tablet 0    meclizine  (ANTIVERT ) 12.5 MG tablet Take 1 tablet (12.5 mg total) by mouth 3 (three) times daily as needed for dizziness. 30 tablet 0    meloxicam  (MOBIC ) 15 MG tablet Take 1 tablet (15 mg total) by mouth daily for 10 days 10 tablet 0     methocarbamol  (ROBAXIN ) 750 MG tablet Take 1 tablet (750 mg total) by mouth every 8 (eight) hours for 10 days 30 tablet 0    montelukast  (SINGULAIR ) 10 MG tablet Take 1 tablet by mouth once a day 90 tablet 1  montelukast  (SINGULAIR ) 10 MG tablet Take 1 tablet by mouth once a day. 90 tablet 1    nitrofurantoin , macrocrystal-monohydrate, (MACROBID ) 100 MG capsule Take 1 capsule (100 mg total) by mouth 2 (two) times daily for 7 days 14 capsule 0    ofloxacin (OCUFLOX) 0.3 % ophthalmic solution   0    Olopatadine HCl 0.2 % SOLN INSTILL 1 DROP INTO EACH EYE ONCE A DAY, AS NEEDED  3    omeprazole  (PRILOSEC) 40 MG capsule Take 1 capsule by mouth every morning 90 capsule 3    omeprazole  (PRILOSEC) 40 MG capsule Take 1 capsule (40 mg total) by mouth daily 30 minutes before the morning meal. 90 capsule 0    omeprazole  (PRILOSEC) 40 MG capsule Take 1 capsule by mouth once daily 30 minutes before morning meal 90 capsule 0    rosuvastatin  (CRESTOR ) 20 MG tablet Take 1 tablet (20 mg total) by mouth daily for 90 days. 90 tablet 1    rosuvastatin  (CRESTOR ) 20 MG tablet Take 1 tablet (20 mg total) by mouth daily. 90 tablet 1    traMADol  (ULTRAM ) 50 MG tablet Take 1 tablet (50 mg total) by mouth every 12 (twelve) hours as needed. 30 tablet 2    triamcinolone  cream (KENALOG ) 0.1 % Apply to affected area twice a day as needed for rash. 80 g 3    valACYclovir  (VALTREX ) 1000 MG tablet Take 2 tablets by mouth every 12 hours for 1 day, per outbreak of fever blister. 20 tablet 1     I have reviewed patient's Past Medical Hx, Surgical Hx, Family Hx, Social Hx, medications and allergies.   ROS:  Review of Systems  Constitutional:  Negative for chills and fever.  Gastrointestinal:  Positive for abdominal pain (mild low abd cramping). Negative for nausea and vomiting.  Genitourinary:  Positive for vaginal bleeding. Negative for vaginal discharge.  Skin:  Negative for pallor.  Neurological:  Negative for dizziness and  syncope.    Physical Exam  Patient Vitals for the past 24 hrs:  BP Temp Temp src Pulse Resp SpO2  03/20/23 1411 (!) 151/66 -- -- 95 -- --  03/20/23 1331 112/62 -- -- 73 -- --  03/20/23 1330 -- -- -- -- -- 97 %  03/20/23 1325 (!) 97/52 -- -- 72 -- 99 %  03/20/23 1320 (!) 80/44 -- -- (!) 58 -- 92 %  03/20/23 1310 (!) 148/58 -- -- (!) 104 -- --  03/20/23 1254 (!) 155/82 -- -- (!) 102 -- --  03/20/23 1157 (!) 187/87 98 F (36.7 C) Oral (!) 104 19 98 %   Constitutional: Well-developed, well-nourished female in no acute distress.  Skin: No pallor  Cardiovascular: Mild tachycardia Respiratory: normal effort GI: Abd soft, non-tender. MS: Extremities nontender, no edema, normal ROM Neurologic: Alert and oriented x 4.  GU: Pelvic: NEFG, large amount of blood, actively bleeding. Speculum placed. Mod amount of clots removed. Pt actively bleeding large amount so sterile gauze packing placed quickly. No bleeding  through packing. Site of bleeding not able to be identified.        Labs: Results for orders placed or performed during the hospital encounter of 03/20/23 (from the past 24 hours)  Type and screen     Status: None   Collection Time: 03/20/23  1:11 PM  Result Value Ref Range   ABO/RH(D) B POS    Antibody Screen NEG    Sample Expiration      03/23/2023,2359 Performed at [facility]  Hospital Lab, [address] [place], [place] [address]   CBC     Status: Abnormal   Collection Time: 03/20/23  1:14 PM  Result Value Ref Range   WBC 5.8 4.0 - 10.5 K/uL   RBC 3.91 3.87 - 5.11 MIL/uL   Hemoglobin 11.5 (L) 12.0 - 15.0 g/dL   HCT 65.4 (L) 63.9 - 53.9 %   MCV 88.2 80.0 - 100.0 fL   MCH 29.4 26.0 - 34.0 pg   MCHC 33.3 30.0 - 36.0 g/dL   RDW 87.3 88.4 - 84.4 %   Platelets 419 (H) 150 - 400 K/uL   nRBC 0.0 0.0 - 0.2 %  Comprehensive metabolic panel     Status: Abnormal   Collection Time: 03/20/23  1:14 PM  Result Value Ref Range   Sodium 139 135 - 145 mmol/L   Potassium 3.7 3.5 - 5.1  mmol/L   Chloride 105 98 - 111 mmol/L   CO2 26 22 - 32 mmol/L   Glucose, Bld 134 (H) 70 - 99 mg/dL   BUN 11 6 - 20 mg/dL   Creatinine, Ser 9.12 0.44 - 1.00 mg/dL   Calcium  9.4 8.9 - 10.3 mg/dL   Total Protein 6.9 6.5 - 8.1 g/dL   Albumin 3.6 3.5 - 5.0 g/dL   AST 21 15 - 41 U/L   ALT 22 0 - 44 U/L   Alkaline Phosphatase 62 38 - 126 U/L   Total Bilirubin <0.2 0.0 - 1.2 mg/dL   GFR, Estimated >39 >39 mL/min   Anion gap 8 5 - 15    Imaging:  No results found.  MAU Course: Orders Placed This Encounter  Procedures   CBC   Comprehensive metabolic panel   Weigh blood loss   Type and screen  NPO Meds ordered this encounter  Medications   sodium chloride  flush (NS) 0.9 % injection 3 mL   sodium chloride  0.9 % bolus 1,000 mL   HYDROmorphone  (DILAUDID ) injection 1 mg   Dr. Linnell called after pt found to have mod-large amount of active bleeding. Coming to [facility] to assess pt. See note.  Pt had symptomatic hypotensive episode. Saline bolus given. VS normalized. Pt feeling better.  CBC was not back at that time discussed possibility of blood transfusion or IV iron if pt become symptomatically anemic. Discussed R/B/I or each. Pt gave verbal consent to blood products and IV iron if indicated.   MDM: - Heavy post-coital vaginal bleeding that appears to be fro partial tear of cuff and posterior fourchette. Need exam and repair undr anesthesia. Prep for OR. Bleeding stable at time of Dr. Philipp exam.   Assessment: 1. PCB (post coital bleeding)   2. Status post hysterectomy     Plan: Prep for OR Dr. Linnell assuming care of pt.    Claudene, Malayzia Laforte , CNM 03/20/2023 2:29 PM

## 2023-03-20 NOTE — Op Note (Addendum)
 PATIENT:   PRE-OPERATIVE DIAGNOSIS:  vaginal laceration  POST-OPERATIVE DIAGNOSIS:  same  PROCEDURE:  repair vaginal laceration  SURGEON:  Devere Brave, MD - Primary  ASSISTANT: none  ANESTHESIA:  GETA  EBL:  5ml during case; pre-procedure bleeding from laceration about in MAU and then with packing removed/clot  IVFluids: 1 liter LR  Urine Out: clear urine  DRAINS: Urinary Catheter (Foley); removed after procedure  LOCAL MEDICATIONS USED: none  SPECIMEN:  none  PLAN OF CARE: d/c home  PATIENT DISPOSITION:  PACU - hemodynamically stable.   Procedure:  Indication:  58 year old female with vaginal laceration after intercourse. Planned procedure of EUA to better evaluate laceration and repair of laceration.  She was brought to the operating room with IV running and received 2 gm Ancef . Time Out carried out and patient was confirmed with procedure as mentioned above.  She underwent general Anesthesia without complications. Exam under anesthesia performed after placement of vaginal retractor posteriorly and a deaver retractor anteriorly. There was a midline laceration extending slightly more to pt right. This was bleeding from friability in various locations but no bleeding vessel noted, and there was only involvement of thin,atrophic mucosa. There was also a slight split at posterior forchette that was hemostatic. Attention was then drawn to the the apex at 3 0'clock and gently grasped with long pickup. An 0 vicryl was then used to reapproximate the edges in a running, locked fashion, taking care to incorporate healthy vaginal tissue. This was done without difficulty and there was no further bleeding. The incision and vagina were dry after procedure. The foley catheter was removed.  All counts were correct x 2. Patient stable and tolerated the procedure well. She was brought to the operating room in stable condition.  I performed the surgery.

## 2023-03-20 NOTE — MAU Note (Signed)
.  Heather Rios is a 58 y.o. at Unknown here in MAU reporting: Pt reports vaginal bleeding that started 1.5-2 hours ago.   Onset of complaint: today Pain score: 4/10 abdominal cramping There were no vitals filed for this visit.   Lab orders placed from triage:

## 2023-03-20 NOTE — Anesthesia Postprocedure Evaluation (Signed)
 Anesthesia Post Note  Patient: Heather Rios  Procedure(s) Performed: EXAM UNDER ANESTHESIA REPAIR VAGINAL LACERATION     Patient location during evaluation: PACU Anesthesia Type: General Level of consciousness: awake and alert Pain management: pain level controlled Vital Signs Assessment: post-procedure vital signs reviewed and stable Respiratory status: spontaneous breathing, nonlabored ventilation and respiratory function stable Cardiovascular status: blood pressure returned to baseline and stable Postop Assessment: no apparent nausea or vomiting Anesthetic complications: no  No notable events documented.  Last Vitals:  Vitals:   03/20/23 1700 03/20/23 1715  BP: 130/70 125/64  Pulse: 71 69  Resp: 19 19  Temp:  36.7 C  SpO2: 95% 97%    Last Pain:  Vitals:   03/20/23 1700  TempSrc:   PainSc: 4                  Rachael Ferrie,W. EDMOND

## 2023-03-20 NOTE — H&P (Signed)
 Heather Rios is an 58 y.o. female G2P1 here with c/o vaginal bleeding. Patient noticed heavy vaginal bleeding after sex. Denies any pain, just noticed blood everywhere. This is second time to have IC in last year, prior IC was uneventful. Pt does use vaginal estrogen sometimes for atrophy. Pt had hyst 2016 for fibroids - cervix/uterus/tubes removed.  Pertinent Gynecological History:  Menstrual History:  Patient's last menstrual period was 01/10/2015 (exact date). S/p hysterectomy    Past Medical History:  Diagnosis Date   Allergic rhinitis    Anemia    Anginal pain (HCC)    Chest Pains 8/16   Eczema    Environmental allergies    Essential hypertension 07/07/2014   GERD (gastroesophageal reflux disease)    Heart murmur    High cholesterol    Hyperlipidemia 07/07/2014   Hypertension    Hypoglycemia    Hypoglycemia    Plantar fasciitis    S/P TAH (total abdominal hysterectomy) 03/10/2015   Sinusitis, chronic    Vasovagal syncope 07/07/2014    Past Surgical History:  Procedure Laterality Date   ABDOMINAL HYSTERECTOMY N/A 03/10/2015   Procedure: HYSTERECTOMY ABDOMINAL;  Surgeon: Hargis Paradise, MD;  Location: [facility];  Service: Gynecology;  Laterality: N/A;   BILATERAL SALPINGECTOMY Bilateral 03/10/2015   pt denies   CHOLECYSTECTOMY     FOOT SURGERY      Family History  Problem Relation Age of Onset   Heart disease Mother    Heart failure Mother    Hyperlipidemia Mother    Heart attack Mother 23   Diabetes Father    Hyperlipidemia Father    Kidney failure Father        kidney transplant receipiant   Diabetes Brother    Diabetes Other    High blood pressure Other    Breast cancer Neg Hx    Colon cancer Neg Hx    Colon polyps Neg Hx    Esophageal cancer Neg Hx    Rectal cancer Neg Hx    Stomach cancer Neg Hx    Pancreatic cancer Neg Hx     Social History:  reports that she has never smoked. She has never used smokeless tobacco. She reports that she does not  drink alcohol and does not use drugs.  Allergies:  Allergies  Allergen Reactions   Contrast Media [Iodinated Contrast Media] Anaphylaxis   Adhesive [Tape] Other (See Comments)    Skin irritation   Amoxicillin-Pot Clavulanate Hives    Has patient had a PCN reaction causing immediate rash, facial/tongue/throat swelling, SOB or lightheadedness with hypotension: No Has patient had a PCN reaction causing severe rash involving mucus membranes or skin necrosis: No Has patient had a PCN reaction that required hospitalization No Has patient had a PCN reaction occurring within the last 10 years: Yes If all of the above answers are NO, then may proceed with Cephalosporin use.  Other reaction(s): hives   Codeine Other (See Comments)    Other reaction(s): Unknown   Hydrochlorothiazide     Other reaction(s): Syncope   Oxycodone-Acetaminophen     Other reaction(s): hallucinate   Peanut Oil     Other reaction(s): Unknown   Strawberry Extract     Other reaction(s): hives   Egg-Derived Products Hives and Rash    Other reaction(s): Unknown   Hydrocodone Bitartrate Er Itching   Latex Itching and Rash   Nexium [Esomeprazole] Other (See Comments)    headahce   Sudafed [Pseudoephedrine Hcl] Palpitations    Medications Prior  to Admission  Medication Sig Dispense Refill Last Dose/Taking   amLODipine  (NORVASC ) 5 MG tablet Take 1 tablet by mouth every morning 90 tablet 1    azelastine  (ASTELIN ) 0.1 % nasal spray Inhale 1-2 sprays into each nostril twice a day as needed. 30 mL 12    azelastine  (OPTIVAR ) 0.05 % ophthalmic solution Apply 1 drop into affected eye(s) twice a day as needed 6 mL 2    azelastine  (OPTIVAR ) 0.05 % ophthalmic solution Place 1 drop into affected eye 2 (two) times daily as needed. 6 mL 3    Blood Pressure Monitoring (OMRON 3 SERIES BP MONITOR) DEVI Use as directed 1 each 0    cefUROXime  (CEFTIN ) 500 MG tablet Take 1 tablet (500 mg total) by mouth every 12 (twelve) hours for 7  days 14 tablet 0    celecoxib  (CELEBREX ) 200 MG capsule Take 1 capsule (200 mg total) by mouth daily with food, as needed for back pain. 30 capsule 2    cholecalciferol (VITAMIN D3) 25 MCG (1000 UNIT) tablet 1 tablet Orally Once a day for 30 day(s)      ciprofloxacin  (CIPRO ) 250 MG tablet Take 1 tablet (250 mg total) by mouth 2 (two) times daily. 6 tablet 0    cyclobenzaprine  (FLEXERIL ) 10 MG tablet Take 1/2 to 1 tablet by mouth at bedtime, as needed for muscle spasm. 30 tablet 1    diclofenac  Sodium (VOLTAREN ) 1 % GEL Apply to affected area(s) as directed as needed. 100 g 1    EPINEPHrine  0.3 mg/0.3 mL IJ SOAJ injection See admin instructions.  0    EPINEPHrine  0.3 mg/0.3 mL IJ SOAJ injection Inject as needed for anaphylaxis.SABRA must call 911 after 1 each 1    estradiol  (ESTRACE ) 0.1 MG/GM vaginal cream Insert 1 g twice a week by vaginal route for 90 days. 42.5 g 0    fluconazole  (DIFLUCAN ) 150 MG tablet Take 1 tablet (150 mg total) by mouth as needed for yeast, repeat at the end of the antibiotic if needed 2 tablet 0    ipratropium (ATROVENT ) 0.03 % nasal spray instill 2 sprays in each nostril twice a day 30 mL 0    irbesartan  (AVAPRO ) 300 MG tablet Take 1 tablet (300 mg total) by mouth every morning. 90 tablet 1    loratadine  (CLARITIN ) 10 MG tablet Take 1 tablet (10 mg total) by mouth daily. 30 tablet 0    meclizine  (ANTIVERT ) 12.5 MG tablet Take 1 tablet (12.5 mg total) by mouth 3 (three) times daily as needed for dizziness. 30 tablet 0    meloxicam  (MOBIC ) 15 MG tablet Take 1 tablet (15 mg total) by mouth daily for 10 days 10 tablet 0    methocarbamol  (ROBAXIN ) 750 MG tablet Take 1 tablet (750 mg total) by mouth every 8 (eight) hours for 10 days 30 tablet 0    montelukast  (SINGULAIR ) 10 MG tablet Take 1 tablet by mouth once a day 90 tablet 1    montelukast  (SINGULAIR ) 10 MG tablet Take 1 tablet by mouth once a day. 90 tablet 1    nitrofurantoin , macrocrystal-monohydrate, (MACROBID ) 100 MG  capsule Take 1 capsule (100 mg total) by mouth 2 (two) times daily for 7 days 14 capsule 0    ofloxacin (OCUFLOX) 0.3 % ophthalmic solution   0    Olopatadine HCl 0.2 % SOLN INSTILL 1 DROP INTO EACH EYE ONCE A DAY, AS NEEDED  3    omeprazole  (PRILOSEC) 40 MG capsule Take 1 capsule by  mouth every morning 90 capsule 3    omeprazole  (PRILOSEC) 40 MG capsule Take 1 capsule (40 mg total) by mouth daily 30 minutes before the morning meal. 90 capsule 0    omeprazole  (PRILOSEC) 40 MG capsule Take 1 capsule by mouth once daily 30 minutes before morning meal 90 capsule 0    rosuvastatin  (CRESTOR ) 20 MG tablet Take 1 tablet (20 mg total) by mouth daily for 90 days. 90 tablet 1    rosuvastatin  (CRESTOR ) 20 MG tablet Take 1 tablet (20 mg total) by mouth daily. 90 tablet 1    traMADol  (ULTRAM ) 50 MG tablet Take 1 tablet (50 mg total) by mouth every 12 (twelve) hours as needed. 30 tablet 2    triamcinolone  cream (KENALOG ) 0.1 % Apply to affected area twice a day as needed for rash. 80 g 3    valACYclovir  (VALTREX ) 1000 MG tablet Take 2 tablets by mouth every 12 hours for 1 day, per outbreak of fever blister. 20 tablet 1     ROS SOB/chest pain/ HA/ vision changes/ LE pain or swelling/ etc.  Blood pressure 112/62, pulse 73, temperature 98 F (36.7 C), temperature source Oral, resp. rate 19, last menstrual period 01/10/2015, SpO2 97%. Physical Exam A&O x 3 HEENT - grossly wnl Lungs : ctab CV : rrr Abdo : soft,nt,nd Extr : no edema,nt bilat Pelvic : packing removed that was placed by [facility] provider; sterile speculum placed and vault with clots and cleared with ringed forceps and 4x4; once cleared, apex of vagina noted to have clot at midline and periodic bleeding from posterior cuff edge. Bilateral apex of laceration noted. Appears that vaginal mucosa has separated and bleeding but not a dehiscence through full thickness of vaginal wall and peritoneum.  The vagina was then packed again with a small raytec to  help with hemostasis.   Results for orders placed or performed during the hospital encounter of 03/20/23 (from the past 24 hours)  Type and screen     Status: None   Collection Time: 03/20/23  1:11 PM  Result Value Ref Range   ABO/RH(D) B POS    Antibody Screen NEG    Sample Expiration      03/23/2023,2359 Performed at [facility], [address], [place], [place] [address]   CBC     Status: Abnormal   Collection Time: 03/20/23  1:14 PM  Result Value Ref Range   WBC 5.8 4.0 - 10.5 K/uL   RBC 3.91 3.87 - 5.11 MIL/uL   Hemoglobin 11.5 (L) 12.0 - 15.0 g/dL   HCT 65.4 (L) 63.9 - 53.9 %   MCV 88.2 80.0 - 100.0 fL   MCH 29.4 26.0 - 34.0 pg   MCHC 33.3 30.0 - 36.0 g/dL   RDW 87.3 88.4 - 84.4 %   Platelets 419 (H) 150 - 400 K/uL   nRBC 0.0 0.0 - 0.2 %      Latest Ref Rng & Units 03/20/2023    1:14 PM 05/17/2021    2:13 PM 02/24/2015    1:40 PM  CMP  Glucose 70 - 99 mg/dL 865   888   BUN 6 - 20 mg/dL 11   12   Creatinine 9.55 - 1.00 mg/dL 9.12  9.19  9.16   Sodium 135 - 145 mmol/L 139   141   Potassium 3.5 - 5.1 mmol/L 3.7   3.4   Chloride 98 - 111 mmol/L 105   107   CO2 22 - 32  mmol/L 26   24   Calcium  8.9 - 10.3 mg/dL 9.4   9.6   Total Protein 6.5 - 8.1 g/dL 6.9     Total Bilirubin 0.0 - 1.2 mg/dL <9.7     Alkaline Phos 38 - 126 U/L 62     AST 15 - 41 U/L 21     ALT 0 - 44 U/L 22        No results found.  Assessment/Plan: 58 y/o with vaginal laceration. I have reviewed findings with patient and recommend proceed to the OR for EUA and repair of vaginal laceration. Appears more superficial but will not know full extent of laceration until EUA.  Reviewed risks of surgery including bleeding, infection, risk of injury to bowel, bladder, nerves, blood vessels, risk of further surgery, risk of anesthesia, risk of blood clot to leg/lung. Pt agrees and consent signed. Plan for repair and pt should be able to go home later today with 2 wk outpt f/u. Consent signed. Hypertension  - reports bps normal at home, took meds this am  France Lusty E Joni Colegrove 03/20/2023, 2:22 PM

## 2023-03-20 NOTE — Anesthesia Preprocedure Evaluation (Addendum)
 Anesthesia Evaluation  Patient identified by MRN, date of birth, ID band Patient awake    Reviewed: Allergy  & Precautions, H&P , NPO status , Patient's Chart, lab work & pertinent test results  Airway Mallampati: I  TM Distance: >3 FB Neck ROM: Full    Dental no notable dental hx. (+) Teeth Intact, Dental Advisory Given   Pulmonary neg pulmonary ROS   Pulmonary exam normal breath sounds clear to auscultation       Cardiovascular hypertension, Pt. on medications  Rhythm:Regular Rate:Normal     Neuro/Psych   Anxiety     negative neurological ROS     GI/Hepatic Neg liver ROS,GERD  Medicated,,  Endo/Other  negative endocrine ROS    Renal/GU negative Renal ROS  negative genitourinary   Musculoskeletal   Abdominal   Peds  Hematology  (+) Blood dyscrasia, anemia   Anesthesia Other Findings   Reproductive/Obstetrics negative OB ROS                             Anesthesia Physical Anesthesia Plan  ASA: 2  Anesthesia Plan: General   Post-op Pain Management: Ofirmev IV (intra-op)* and Toradol  IV (intra-op)*   Induction: Intravenous  PONV Risk Score and Plan: 4 or greater and Ondansetron , Dexamethasone , Midazolam  and Scopolamine  patch - Pre-op  Airway Management Planned: Oral ETT  Additional Equipment:   Intra-op Plan:   Post-operative Plan: Extubation in OR  Informed Consent: I have reviewed the patients History and Physical, chart, labs and discussed the procedure including the risks, benefits and alternatives for the proposed anesthesia with the patient or authorized representative who has indicated his/her understanding and acceptance.     Dental advisory given  Plan Discussed with: CRNA  Anesthesia Plan Comments:        Anesthesia Quick Evaluation

## 2023-03-20 NOTE — Anesthesia Procedure Notes (Signed)
 Procedure Name: Intubation Date/Time: 03/20/2023 3:33 PM  Performed by: Charlton Tinnie FALCON, CRNAPre-anesthesia Checklist: Patient identified, Emergency Drugs available, Suction available and Patient being monitored Patient Re-evaluated:Patient Re-evaluated prior to induction Oxygen Delivery Method: Circle System Utilized Preoxygenation: Pre-oxygenation with 100% oxygen Induction Type: IV induction, Rapid sequence and Cricoid Pressure applied Laryngoscope Size: Miller and 2 Grade View: Grade II Tube type: Oral Tube size: 7.0 mm Number of attempts: 1 Airway Equipment and Method: Stylet Placement Confirmation: ETT inserted through vocal cords under direct vision, positive ETCO2 and breath sounds checked- equal and bilateral Secured at: 20 cm Tube secured with: Tape Dental Injury: Teeth and Oropharynx as per pre-operative assessment

## 2023-03-20 NOTE — Transfer of Care (Signed)
 Immediate Anesthesia Transfer of Care Note  Patient: Heather Rios  Procedure(s) Performed: ERASMO UNDER ANESTHESIA REPAIR VAGINAL LACERATION  Patient Location: PACU  Anesthesia Type:General  Level of Consciousness: awake, drowsy, and patient cooperative  Airway & Oxygen Therapy: Patient Spontanous Breathing  Post-op Assessment: Report given to RN, Post -op Vital signs reviewed and stable, and Patient moving all extremities X 4  Post vital signs: Reviewed and stable  Last Vitals:  Vitals Value Taken Time  BP 126/70 03/20/23 1630  Temp 36.6 C 03/20/23 1625  Pulse 77 03/20/23 1634  Resp 16 03/20/23 1634  SpO2 96 % 03/20/23 1634  Vitals shown include unfiled device data.  Last Pain:  Vitals:   03/20/23 1440  TempSrc:   PainSc: 4          Complications: No notable events documented.

## 2023-03-21 ENCOUNTER — Encounter (HOSPITAL_COMMUNITY): Payer: Self-pay | Admitting: Obstetrics and Gynecology

## 2023-04-15 ENCOUNTER — Other Ambulatory Visit (HOSPITAL_COMMUNITY): Payer: Self-pay

## 2023-04-16 ENCOUNTER — Other Ambulatory Visit (HOSPITAL_COMMUNITY): Payer: Self-pay

## 2023-04-16 MED ORDER — OMEPRAZOLE 40 MG PO CPDR
40.0000 mg | DELAYED_RELEASE_CAPSULE | Freq: Every day | ORAL | 1 refills | Status: DC
  Filled 2023-04-16: qty 90, 90d supply, fill #0
  Filled 2023-07-08 – 2023-07-18 (×2): qty 90, 90d supply, fill #1

## 2023-04-17 ENCOUNTER — Other Ambulatory Visit (HOSPITAL_COMMUNITY): Payer: Self-pay

## 2023-04-17 ENCOUNTER — Other Ambulatory Visit: Payer: Self-pay

## 2023-04-17 DIAGNOSIS — Z4889 Encounter for other specified surgical aftercare: Secondary | ICD-10-CM | POA: Diagnosis not present

## 2023-04-17 DIAGNOSIS — S3141XD Laceration without foreign body of vagina and vulva, subsequent encounter: Secondary | ICD-10-CM | POA: Diagnosis not present

## 2023-04-17 DIAGNOSIS — N952 Postmenopausal atrophic vaginitis: Secondary | ICD-10-CM | POA: Diagnosis not present

## 2023-04-17 MED ORDER — ESTRADIOL 0.1 MG/GM VA CREA
1.0000 g | TOPICAL_CREAM | VAGINAL | 3 refills | Status: DC
  Filled 2023-04-17: qty 42.5, 90d supply, fill #0
  Filled 2023-07-12: qty 42.5, 90d supply, fill #1

## 2023-04-29 ENCOUNTER — Ambulatory Visit (AMBULATORY_SURGERY_CENTER): Payer: Commercial Managed Care - PPO

## 2023-04-29 VITALS — Ht 61.0 in | Wt 154.0 lb

## 2023-04-29 DIAGNOSIS — Z860100 Personal history of colon polyps, unspecified: Secondary | ICD-10-CM

## 2023-04-29 MED ORDER — SUFLAVE 178.7 G PO SOLR: 1.0000 | ORAL | 0 refills | Status: DC

## 2023-04-29 NOTE — Progress Notes (Addendum)
 No soy allergy known to patient  Pt with know egg allergy: hives/rash  No issues known to pt with past sedation with any surgeries or procedures Patient denies ever being told they had issues or difficulty with intubation  No FH of Malignant Hyperthermia Pt is not on diet pills Pt is not on  home 02  Pt is not on blood thinners  Pt denies issues with constipation  No A fib or A flutter Have any cardiac testing pending-- no  LOA: independent  Prep: suflave    Patient's chart reviewed by Rogena Class CNRA prior to previsit and patient appropriate for the [facility].  Previsit completed and red dot placed by patient's name on their procedure day (on provider's schedule).     PV completed with patient. Prep instructions sent via mychart and home address.

## 2023-05-09 ENCOUNTER — Other Ambulatory Visit (HOSPITAL_COMMUNITY): Payer: Self-pay

## 2023-05-09 ENCOUNTER — Encounter: Payer: Self-pay | Admitting: Gastroenterology

## 2023-05-14 ENCOUNTER — Ambulatory Visit: Payer: Commercial Managed Care - PPO | Admitting: Gastroenterology

## 2023-05-14 ENCOUNTER — Encounter: Payer: Self-pay | Admitting: Gastroenterology

## 2023-05-14 VITALS — BP 150/87 | HR 56 | Temp 97.9°F | Resp 11 | Ht 61.0 in | Wt 150.6 lb

## 2023-05-14 DIAGNOSIS — K648 Other hemorrhoids: Secondary | ICD-10-CM | POA: Diagnosis not present

## 2023-05-14 DIAGNOSIS — Z860100 Personal history of colon polyps, unspecified: Secondary | ICD-10-CM | POA: Diagnosis not present

## 2023-05-14 DIAGNOSIS — K5730 Diverticulosis of large intestine without perforation or abscess without bleeding: Secondary | ICD-10-CM

## 2023-05-14 DIAGNOSIS — K644 Residual hemorrhoidal skin tags: Secondary | ICD-10-CM

## 2023-05-14 DIAGNOSIS — D120 Benign neoplasm of cecum: Secondary | ICD-10-CM

## 2023-05-14 DIAGNOSIS — Z860101 Personal history of adenomatous and serrated colon polyps: Secondary | ICD-10-CM

## 2023-05-14 DIAGNOSIS — Z1211 Encounter for screening for malignant neoplasm of colon: Secondary | ICD-10-CM

## 2023-05-14 DIAGNOSIS — I10 Essential (primary) hypertension: Secondary | ICD-10-CM | POA: Diagnosis not present

## 2023-05-14 MED ORDER — SODIUM CHLORIDE 0.9 % IV SOLN: 500.0000 mL | Freq: Once | INTRAVENOUS | Status: DC

## 2023-05-14 NOTE — Op Note (Signed)
 [facility] Patient Name: Heather Rios Procedure Date: 05/14/2023 10:15 AM MRN: [id number] Endoscopist: Napoleon Form , MD, [id number] Age: 58 Referring MD:  Date of Birth: Apr 04, 1965 Gender: Female Account #: [id number] Procedure:                Colonoscopy Indications:              High risk colon cancer surveillance: Personal                            history of colonic polyps, High risk colon cancer                            surveillance: Personal history of adenoma less than                            10 mm in size Medicines:                Monitored Anesthesia Care Procedure:                Pre-Anesthesia Assessment:                           - Prior to the procedure, a History and Physical                            was performed, and patient medications and                            allergies were reviewed. The patient's tolerance of                            previous anesthesia was also reviewed. The risks                            and benefits of the procedure and the sedation                            options and risks were discussed with the patient.                            All questions were answered, and informed consent                            was obtained. Prior Anticoagulants: The patient has                            taken no anticoagulant or antiplatelet agents. ASA                            Grade Assessment: II - A patient with mild systemic                            disease. After reviewing the risks and benefits,  the patient was deemed in satisfactory condition to                            undergo the procedure.                           After obtaining informed consent, the colonoscope                            was passed under direct vision. Throughout the                            procedure, the patient's blood pressure, pulse, and                            oxygen saturations were monitored  continuously. The                            Olympus Scope SN [id number] was introduced through the                            anus and advanced to the the cecum, identified by                            appendiceal orifice and ileocecal valve. The                            colonoscopy was performed without difficulty. The                            patient tolerated the procedure well. The quality                            of the bowel preparation was good. The ileocecal                            valve, appendiceal orifice, and rectum were                            photographed. Scope In: 10:22:44 AM Scope Out: 10:37:49 AM Scope Withdrawal Time: 0 hours 9 minutes 34 seconds  Total Procedure Duration: 0 hours 15 minutes 5 seconds  Findings:                 The perianal and digital rectal examinations were                            normal. Pertinent negatives include normal                            sphincter tone.                           A less than 1 mm polyp was found in the cecum. The  polyp was sessile. The polyp was removed with a                            cold biopsy forceps. Resection and retrieval were                            complete.                           Scattered small-mouthed diverticula were found in                            the sigmoid colon, descending colon, transverse                            colon and ascending colon.                           Non-bleeding external and internal hemorrhoids were                            found during retroflexion. The hemorrhoids were                            small. Complications:            No immediate complications. Estimated Blood Loss:     Estimated blood loss was minimal. Impression:               - One less than 1 mm polyp in the cecum, removed                            with a cold biopsy forceps. Resected and retrieved.                           - Diverticulosis in the sigmoid  colon, in the                            descending colon, in the transverse colon and in                            the ascending colon.                           - Non-bleeding external and internal hemorrhoids. Recommendation:           - Patient has a contact number available for                            emergencies. The signs and symptoms of potential                            delayed complications were discussed with the                            patient. Return to normal activities tomorrow.  Written discharge instructions were provided to the                            patient.                           - Resume previous diet.                           - Continue present medications.                           - Await pathology results.                           - Repeat colonoscopy in 7-10 years for surveillance                            based on pathology results. Napoleon Form, MD 05/14/2023 10:48:27 AM This report has been signed electronically.

## 2023-05-14 NOTE — Patient Instructions (Signed)

## 2023-05-14 NOTE — Progress Notes (Signed)
 Called to room to assist during endoscopic procedure.  Patient ID and intended procedure confirmed with present staff. Received instructions for my participation in the procedure from the performing physician.

## 2023-05-14 NOTE — Progress Notes (Unsigned)
 [facility] History and Physical   Primary Care Physician:  Aliene Beams, MD   Reason for Procedure:  History of adenomatous colon polyps  Plan:    Surveillance colonoscopy with possible interventions as needed     HPI: Heather Rios is a very pleasant 58 y.o. female here for surveillance colonoscopy. Denies any nausea, vomiting, abdominal pain, melena or bright red blood per rectum  The risks and benefits as well as alternatives of endoscopic procedure(s) have been discussed and reviewed. All questions answered. The patient agrees to proceed.    Past Medical History:  Diagnosis Date   Allergic rhinitis    Anemia    Anginal pain (HCC)    Chest Pains 8/16   Eczema    Environmental allergies    Essential hypertension 07/07/2014   GERD (gastroesophageal reflux disease)    Heart murmur    High cholesterol    Hyperlipidemia 07/07/2014   Hypertension    Hypoglycemia    Hypoglycemia    Plantar fasciitis    S/P TAH (total abdominal hysterectomy) 03/10/2015   Sinusitis, chronic    Vasovagal syncope 07/07/2014    Past Surgical History:  Procedure Laterality Date   ABDOMINAL HYSTERECTOMY N/A 03/10/2015   Procedure: HYSTERECTOMY ABDOMINAL;  Surgeon: Geryl Rankins, MD;  Location: [facility];  Service: Gynecology;  Laterality: N/A;   BILATERAL SALPINGECTOMY Bilateral 03/10/2015   pt denies   CHOLECYSTECTOMY     FOOT SURGERY     PERINEAL LACERATION REPAIR N/A 03/20/2023   Procedure: REPAIR VAGINAL LACERATION;  Surgeon: Vick Frees, MD;  Location: [facility];  Service: Gynecology;  Laterality: N/A;    Prior to Admission medications   Medication Sig Start Date End Date Taking? Authorizing Provider  azelastine (OPTIVAR) 0.05 % ophthalmic solution Place 1 drop into affected eye 2 (two) times daily as needed. 11/20/22  Yes   Blood Pressure Monitoring (OMRON 3 SERIES BP MONITOR) DEVI Use as directed 12/26/20  Yes Andrez Grime, RPH  irbesartan (AVAPRO) 300 MG tablet  Take 1 tablet (300 mg total) by mouth every morning. 02/27/23  Yes   loratadine (CLARITIN) 10 MG tablet Take 1 tablet (10 mg total) by mouth daily. 06/11/22  Yes   ofloxacin (OCUFLOX) 0.3 % ophthalmic solution  06/27/15  Yes [provider]  omeprazole (PRILOSEC) 40 MG capsule Take 1 capsule (40 mg total) by mouth 30 minutes before morning meal 04/16/23  Yes   rosuvastatin (CRESTOR) 20 MG tablet Take 1 tablet (20 mg total) by mouth daily. 03/05/23  Yes   amLODipine (NORVASC) 5 MG tablet Take 1 tablet by mouth every morning Patient not taking: Reported on 05/14/2023 12/13/20     azelastine (ASTELIN) 0.1 % nasal spray Inhale 1-2 sprays into each nostril twice a day as needed. 12/13/20     celecoxib (CELEBREX) 200 MG capsule Take 1 capsule (200 mg total) by mouth daily with food, as needed for back pain. 01/17/22     cholecalciferol (VITAMIN D3) 25 MCG (1000 UNIT) tablet 1 tablet Orally Once a day for 30 day(s)    [provider]  cyclobenzaprine (FLEXERIL) 10 MG tablet Take 1/2 to 1 tablet by mouth at bedtime, as needed for muscle spasm. 12/13/20     diclofenac Sodium (VOLTAREN) 1 % GEL Apply to affected area(s) as directed as needed. 12/13/20     EPINEPHrine 0.3 mg/0.3 mL IJ SOAJ injection Inject as needed for anaphylaxis.Marland Kitchen must call 911 after 01/30/23     estradiol (ESTRACE) 0.1 MG/GM  vaginal cream Place 1 g vaginally 3 (three) times a week. 04/17/23     ipratropium (ATROVENT) 0.03 % nasal spray instill 2 sprays in each nostril twice a day 11/02/22     meclizine (ANTIVERT) 12.5 MG tablet Take 1 tablet (12.5 mg total) by mouth 3 (three) times daily as needed for dizziness. 01/11/22   Ellsworth Lennox, PA-C  meloxicam (MOBIC) 15 MG tablet Take 1 tablet (15 mg total) by mouth daily for 10 days Patient taking differently: Take 15 mg by mouth daily as needed. 10/08/22     methocarbamol (ROBAXIN) 750 MG tablet Take 1 tablet (750 mg total) by mouth every 8 (eight) hours for 10 days Patient taking  differently: Take 750 mg by mouth every 8 (eight) hours as needed. 10/08/22     Olopatadine HCl 0.2 % SOLN INSTILL 1 DROP INTO EACH EYE ONCE A DAY, AS NEEDED 05/15/17   [provider]  traMADol (ULTRAM) 50 MG tablet Take 1 tablet (50 mg total) by mouth every 6 (six) hours as needed for severe pain (pain score 7-10) (severe post op pain; pt tolerates this medication). 03/20/23   Vick Frees, MD  triamcinolone cream (KENALOG) 0.1 % Apply to affected area twice a day as needed for rash. 12/13/20     valACYclovir (VALTREX) 1000 MG tablet Take 2 tablets by mouth every 12 hours for 1 day, per outbreak of fever blister. 12/13/20       Current Outpatient Medications  Medication Sig Dispense Refill   azelastine (OPTIVAR) 0.05 % ophthalmic solution Place 1 drop into affected eye 2 (two) times daily as needed. 6 mL 3   Blood Pressure Monitoring (OMRON 3 SERIES BP MONITOR) DEVI Use as directed 1 each 0   irbesartan (AVAPRO) 300 MG tablet Take 1 tablet (300 mg total) by mouth every morning. 90 tablet 1   loratadine (CLARITIN) 10 MG tablet Take 1 tablet (10 mg total) by mouth daily. 30 tablet 0   ofloxacin (OCUFLOX) 0.3 % ophthalmic solution   0   omeprazole (PRILOSEC) 40 MG capsule Take 1 capsule (40 mg total) by mouth 30 minutes before morning meal 90 capsule 1   rosuvastatin (CRESTOR) 20 MG tablet Take 1 tablet (20 mg total) by mouth daily. 90 tablet 1   amLODipine (NORVASC) 5 MG tablet Take 1 tablet by mouth every morning (Patient not taking: Reported on 05/14/2023) 90 tablet 1   azelastine (ASTELIN) 0.1 % nasal spray Inhale 1-2 sprays into each nostril twice a day as needed. 30 mL 12   celecoxib (CELEBREX) 200 MG capsule Take 1 capsule (200 mg total) by mouth daily with food, as needed for back pain. 30 capsule 2   cholecalciferol (VITAMIN D3) 25 MCG (1000 UNIT) tablet 1 tablet Orally Once a day for 30 day(s)     cyclobenzaprine (FLEXERIL) 10 MG tablet Take 1/2 to 1 tablet by mouth at bedtime, as  needed for muscle spasm. 30 tablet 1   diclofenac Sodium (VOLTAREN) 1 % GEL Apply to affected area(s) as directed as needed. 100 g 1   EPINEPHrine 0.3 mg/0.3 mL IJ SOAJ injection Inject as needed for anaphylaxis.Marland Kitchen must call 911 after 1 each 1   estradiol (ESTRACE) 0.1 MG/GM vaginal cream Place 1 g vaginally 3 (three) times a week. 42.5 g 3   ipratropium (ATROVENT) 0.03 % nasal spray instill 2 sprays in each nostril twice a day 30 mL 0   meclizine (ANTIVERT) 12.5 MG tablet Take 1 tablet (12.5 mg total)  by mouth 3 (three) times daily as needed for dizziness. 30 tablet 0   meloxicam (MOBIC) 15 MG tablet Take 1 tablet (15 mg total) by mouth daily for 10 days (Patient taking differently: Take 15 mg by mouth daily as needed.) 10 tablet 0   methocarbamol (ROBAXIN) 750 MG tablet Take 1 tablet (750 mg total) by mouth every 8 (eight) hours for 10 days (Patient taking differently: Take 750 mg by mouth every 8 (eight) hours as needed.) 30 tablet 0   Olopatadine HCl 0.2 % SOLN INSTILL 1 DROP INTO EACH EYE ONCE A DAY, AS NEEDED  3   traMADol (ULTRAM) 50 MG tablet Take 1 tablet (50 mg total) by mouth every 6 (six) hours as needed for severe pain (pain score 7-10) (severe post op pain; pt tolerates this medication). 20 tablet 0   triamcinolone cream (KENALOG) 0.1 % Apply to affected area twice a day as needed for rash. 80 g 3   valACYclovir (VALTREX) 1000 MG tablet Take 2 tablets by mouth every 12 hours for 1 day, per outbreak of fever blister. 20 tablet 1   Current Facility-Administered Medications  Medication Dose Route Frequency Provider Last Rate Last Admin   0.9 %  sodium chloride infusion  500 mL Intravenous Once Napoleon Form, MD        Allergies as of 05/14/2023 - Review Complete 05/14/2023  Allergen Reaction Noted   Contrast media [iodinated contrast media] Anaphylaxis 07/07/2014   Shellfish allergy Dermatitis, Itching, and Shortness Of Breath 10/13/2021   Amoxicillin-pot clavulanate Hives  07/07/2014   Strawberry extract Other (See Comments) 03/07/2021   Adhesive [tape] Other (See Comments) 07/07/2014   Codeine Other (See Comments) 02/01/2021   Egg-derived products Hives and Rash 02/11/2011   Hydrochlorothiazide Other (See Comments) 02/01/2021   Hydrocodone bitartrate er Itching 07/07/2014   Latex Itching and Rash 08/09/2016   Nexium [esomeprazole] Other (See Comments) 07/07/2014   Oxycodone-acetaminophen Other (See Comments) 02/01/2021   Peanut oil Other (See Comments) 02/01/2021   Sudafed [pseudoephedrine hcl] Palpitations 07/07/2014    Family History  Problem Relation Age of Onset   Heart disease Mother    Heart failure Mother    Hyperlipidemia Mother    Heart attack Mother 37   Diabetes Father    Hyperlipidemia Father    Kidney failure Father        kidney transplant receipiant   Diabetes Brother    Diabetes Other    High blood pressure Other    Breast cancer Neg Hx    Colon cancer Neg Hx    Colon polyps Neg Hx    Esophageal cancer Neg Hx    Rectal cancer Neg Hx    Stomach cancer Neg Hx    Pancreatic cancer Neg Hx     Social History   Socioeconomic History   Marital status: Single    Spouse name: Not on file   Number of children: 1   Years of education: Not on file   Highest education level: Not on file  Occupational History   Not on file  Tobacco Use   Smoking status: Never   Smokeless tobacco: Never  Vaping Use   Vaping status: Never Used  Substance and Sexual Activity   Alcohol use: No   Drug use: No   Sexual activity: Yes    Birth control/protection: Implant  Other Topics Concern   Not on file  Social History Narrative   Not on file   Social Drivers of Health  Financial Resource Strain: Not on file  Food Insecurity: Low Risk  (12/17/2022)   Received from [facility]   Hunger Vital Sign    Worried About Running Out of Food in the Last Year: Never true    Ran Out of Food in the Last Year: Never true  Transportation Needs: No  Transportation Needs (12/17/2022)   Received from [facility]    In the past 12 months, has lack of reliable transportation kept you from medical appointments, meetings, work or from getting things needed for daily living? : No  Physical Activity: Not on file  Stress: Not on file  Social Connections: Not on file  Intimate Partner Violence: Not on file    Review of Systems:  All other review of systems negative except as mentioned in the HPI.  Physical Exam: Vital signs in last 24 hours: BP (!) 144/90   Pulse (!) 58   Temp 97.9 F (36.6 C)   Ht 5\' 1"  (1.549 m)   Wt 150 lb 9.6 oz (68.3 kg)   LMP 01/10/2015 (Exact Date)   SpO2 96%   BMI 28.46 kg/m  General:   Alert, NAD Lungs:  Clear .   Heart:  Regular rate and rhythm Abdomen:  Soft, nontender and nondistended. Neuro/Psych:  Alert and cooperative. Normal mood and affect. A and O x 3  Reviewed labs, radiology imaging, old records and pertinent past GI work up  Patient is appropriate for planned procedure(s) and anesthesia in an ambulatory setting   K. Scherry Ran , MD [phone]

## 2023-05-14 NOTE — Progress Notes (Unsigned)
 Sedate, gd SR, tolerated procedure well, VSS, report to RN

## 2023-05-15 ENCOUNTER — Telehealth: Payer: Self-pay | Admitting: *Deleted

## 2023-05-15 NOTE — Telephone Encounter (Signed)
  Follow up Call-     05/14/2023    9:56 AM 02/27/2021   10:58 AM  Call back number  Post procedure Call Back phone  # [phone]  Permission to leave phone message Yes      Patient questions:    We were "disconnected."

## 2023-05-17 LAB — SURGICAL PATHOLOGY

## 2023-05-18 ENCOUNTER — Other Ambulatory Visit (HOSPITAL_COMMUNITY): Payer: Self-pay

## 2023-05-30 ENCOUNTER — Other Ambulatory Visit (HOSPITAL_COMMUNITY): Payer: Self-pay

## 2023-05-30 DIAGNOSIS — T7840XA Allergy, unspecified, initial encounter: Secondary | ICD-10-CM | POA: Diagnosis not present

## 2023-05-30 DIAGNOSIS — E7800 Pure hypercholesterolemia, unspecified: Secondary | ICD-10-CM | POA: Diagnosis not present

## 2023-05-30 DIAGNOSIS — R399 Unspecified symptoms and signs involving the genitourinary system: Secondary | ICD-10-CM | POA: Diagnosis not present

## 2023-05-30 DIAGNOSIS — I10 Essential (primary) hypertension: Secondary | ICD-10-CM | POA: Diagnosis not present

## 2023-05-31 ENCOUNTER — Other Ambulatory Visit: Payer: Self-pay

## 2023-05-31 ENCOUNTER — Other Ambulatory Visit (HOSPITAL_COMMUNITY): Payer: Self-pay

## 2023-05-31 MED ORDER — FLUTICASONE PROPIONATE 50 MCG/ACT NA SUSP
1.0000 | Freq: Two times a day (BID) | NASAL | 0 refills | Status: AC
  Filled 2023-05-31: qty 16, 30d supply, fill #0
  Filled 2023-06-12: qty 48, 90d supply, fill #0
  Filled 2023-06-18: qty 16, 30d supply, fill #0
  Filled 2023-07-11 – 2023-11-29 (×17): qty 16, 30d supply, fill #1
  Filled 2023-12-06 – 2024-01-07 (×4): qty 16, 30d supply, fill #0

## 2023-06-06 ENCOUNTER — Other Ambulatory Visit (HOSPITAL_COMMUNITY): Payer: Self-pay

## 2023-06-11 ENCOUNTER — Other Ambulatory Visit (HOSPITAL_COMMUNITY): Payer: Self-pay

## 2023-06-12 ENCOUNTER — Other Ambulatory Visit (HOSPITAL_COMMUNITY): Payer: Self-pay

## 2023-06-17 ENCOUNTER — Other Ambulatory Visit (HOSPITAL_COMMUNITY): Payer: Self-pay

## 2023-06-17 ENCOUNTER — Other Ambulatory Visit (HOSPITAL_BASED_OUTPATIENT_CLINIC_OR_DEPARTMENT_OTHER): Payer: Self-pay

## 2023-06-18 ENCOUNTER — Other Ambulatory Visit (HOSPITAL_COMMUNITY): Payer: Self-pay

## 2023-06-18 MED ORDER — ROSUVASTATIN CALCIUM 20 MG PO TABS
20.0000 mg | ORAL_TABLET | Freq: Every day | ORAL | 0 refills | Status: DC
  Filled 2023-06-18 – 2023-11-28 (×4): qty 90, 90d supply, fill #0

## 2023-06-22 ENCOUNTER — Other Ambulatory Visit (HOSPITAL_COMMUNITY): Payer: Self-pay

## 2023-07-09 ENCOUNTER — Ambulatory Visit: Payer: Self-pay | Admitting: Allergy & Immunology

## 2023-07-11 ENCOUNTER — Other Ambulatory Visit (HOSPITAL_COMMUNITY): Payer: Self-pay

## 2023-07-11 DIAGNOSIS — N952 Postmenopausal atrophic vaginitis: Secondary | ICD-10-CM | POA: Diagnosis not present

## 2023-07-11 DIAGNOSIS — S3141XD Laceration without foreign body of vagina and vulva, subsequent encounter: Secondary | ICD-10-CM | POA: Diagnosis not present

## 2023-07-11 MED ORDER — ESTRADIOL 0.1 MG/GM VA CREA
1.0000 g | TOPICAL_CREAM | VAGINAL | 3 refills | Status: DC
Start: 2023-07-11 — End: 2024-02-10
  Filled 2023-07-11 – 2023-07-18 (×2): qty 42.5, 90d supply, fill #0
  Filled 2023-10-21 – 2023-11-15 (×4): qty 42.5, 90d supply, fill #1

## 2023-07-12 ENCOUNTER — Other Ambulatory Visit (HOSPITAL_COMMUNITY): Payer: Self-pay

## 2023-07-12 ENCOUNTER — Other Ambulatory Visit: Payer: Self-pay

## 2023-07-17 ENCOUNTER — Other Ambulatory Visit (HOSPITAL_COMMUNITY): Payer: Self-pay

## 2023-07-18 ENCOUNTER — Other Ambulatory Visit (HOSPITAL_COMMUNITY): Payer: Self-pay

## 2023-07-18 ENCOUNTER — Other Ambulatory Visit: Payer: Self-pay

## 2023-07-25 ENCOUNTER — Encounter: Payer: Self-pay | Admitting: Gastroenterology

## 2023-07-29 ENCOUNTER — Other Ambulatory Visit (HOSPITAL_COMMUNITY): Payer: Self-pay

## 2023-07-30 ENCOUNTER — Ambulatory Visit: Admitting: Allergy & Immunology

## 2023-07-30 ENCOUNTER — Encounter: Payer: Self-pay | Admitting: Allergy & Immunology

## 2023-07-30 ENCOUNTER — Other Ambulatory Visit (HOSPITAL_COMMUNITY): Payer: Self-pay

## 2023-07-30 ENCOUNTER — Other Ambulatory Visit: Payer: Self-pay

## 2023-07-30 VITALS — BP 140/82 | HR 68 | Temp 98.0°F | Resp 18

## 2023-07-30 DIAGNOSIS — J310 Chronic rhinitis: Secondary | ICD-10-CM | POA: Diagnosis not present

## 2023-07-30 DIAGNOSIS — L2089 Other atopic dermatitis: Secondary | ICD-10-CM

## 2023-07-30 DIAGNOSIS — L500 Allergic urticaria: Secondary | ICD-10-CM | POA: Diagnosis not present

## 2023-07-30 NOTE — Patient Instructions (Addendum)
 1. Flexural atopic dermatitis - This seems to be under good control with the current regimen.   2. Chronic rhinitis - Because of insurance stipulations, we cannot do skin testing on the same day as your first visit. - We are all working to fight this, but for now we need to do two separate visits.  - We will know more after we do testing at the next visit.  - The skin testing visit can be squeezed in at your convenience.  - Then we can make a more full plan to address all of your symptoms. - Be sure to stop your antihistamines for 3 days before this appointment.   3. Allergic urticaria - We will do testing to the triggering foods at the next visit (pineapple, strawberry, peanuts). - Food sheet provided to circle other concerning foods. - There is a high rate of FALSE POSITIVES with food testing, so beware of that when choosing foods.   4. Return in about 1 week (around 08/06/2023) for SKIN TESTING (1-55 + [employer]). You can have the follow up appointment with Dr. Idolina Maker or a Nurse Practicioner (our Nurse Practitioners are excellent and always have Physician oversight!).    Please inform us  of any Emergency Department visits, hospitalizations, or changes in symptoms. Call us  before going to the ED for breathing or allergy symptoms since we might be able to fit you in for a sick visit. Feel free to contact us  anytime with any questions, problems, or concerns.  It was a pleasure to meet you today!  Websites that have reliable patient information: 1. American Academy of Asthma, Allergy, and Immunology: www.aaaai.org 2. Food Allergy Research and Education (FARE): foodallergy.org 3. Mothers of Asthmatics: http://www.asthmacommunitynetwork.org 4. American College of Allergy, Asthma, and Immunology: www.acaai.org      "Like" us  on Facebook and Instagram for our latest updates!      A healthy democracy works best when [employer] participate! Make sure you are registered to vote! If  you have moved or changed any of your contact information, you will need to get this updated before voting! Scan the QR codes below to learn more!

## 2023-07-30 NOTE — Progress Notes (Signed)
 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 Flexural atopic dermatitis - This seems to be under good control with the current regimen.   2. Chronic rhinitis - Because of insurance stipulations, we cannot do skin testing on the same day as your first visit. - We are all working to fight this, but for now we need to do two separate visits.  - We will know more after we do testing at the next visit.  - The skin testing visit can be squeezed in at your convenience.  - Then we can make a more full plan to address all of your symptoms. - Be sure to stop your antihistamines for 3 days before this appointment.   3. Allergic urticaria - We will do testing to the triggering foods at the next visit (pineapple, strawberry, peanuts). - Food sheet provided to circle other concerning foods. - There is a high rate of FALSE POSITIVES with food testing, so beware of that when choosing foods.   4. Return in about 1 week (around 08/06/2023) for SKIN TESTING (1-55 + [facility] [employer]). You can have the follow up appointment with Dr. Idolina Maker or a Nurse Practicioner (our Nurse Practitioners are excellent and always have Physician oversight!).     This note in its entirety was forwarded to the Provider who requested this consultation.  Subjective:   Heather Rios is a 58 y.o. female presenting today for evaluation of  Chief Complaint  Patient presents with   Establish Care    Would like allergy testing. Does have some food and seasonal allergies.    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   Thyromegaly 02/08/2021   Uterine fibroid 02/08/2021   Sciatica, right side 02/08/2021   Pure hypercholesterolemia 02/08/2021   Prediabetes 02/08/2021   Plantar fasciitis of left foot 02/08/2021   Palpitations 02/08/2021   Oral herpes simplex infection 02/08/2021   Menopause 02/08/2021   Menometrorrhagia 02/08/2021   Low back pain 02/08/2021   History of psychiatric disorder 02/08/2021   Encounter for surveillance of other contraceptives 02/08/2021   Eczema 02/08/2021   Dysuria 02/08/2021   Dysmenorrhea 02/08/2021   Carpal tunnel syndrome of left wrist 02/08/2021   Anxiety 02/08/2021   Allergy to eggs 02/08/2021   Allergic rhinitis 02/08/2021   Allergic conjunctivitis 02/08/2021   Adjustment disorder with depressed mood 02/08/2021   Acute ethmoidal sinusitis 02/08/2021   Acquired absence of both cervix and uterus 02/08/2021   Abnormal vaginal bleeding 02/08/2021   S/P TAH (total abdominal hysterectomy) 03/10/2015   Vasovagal syncope 07/07/2014   Essential hypertension 07/07/2014    History obtained from: chart review and patient.  Discussed the use of AI scribe software for clinical note transcription with the patient and/or guardian, who gave verbal consent to proceed.  Heather Rios was referred by Dorena Gander, MD.  Heather Rios is a 58 y.o. female presenting for an evaluation of allergic and eczema.  Allergic Rhinitis Symptom History: Her environmental allergies cause sneezing and itchy, watery eyes, particularly when it rains. She takes Claritin  and Xyzal  at night, which help but do not completely alleviate her symptoms. Pollen exposure still causes facial redness and neck  irritation, although her sinus issues have improved over time. She has tried claritin  in the morning and levocetirizine as night.   She uses ear protection to prevent rain and dampness from causing ear pain, especially after washing her hair. She has no known metal allergies and uses Dove products without issue.  Food Allergy Symptom History: She has multiple food allergies, including reactions to pineapples, strawberries, eggs, shellfish, and peanut oil, resulting in itching, hives, and sometimes facial rashes. No throat involvement, chest pain, or difficulty breathing is associated with these reactions.  Skin Symptom History: She experiences eczema flares primarily during the spring and summer months, managed with triamcinolone  cream and Eucerin moisturizer as needed. She has recurrent staph infections requiring antibiotics and steroids approximately two to three times a year.  She works as a Nutritional therapist, covering all shifts, and has been with the hospital for over twenty years. She lives alone and has a 78 year old child who resides in [place].   Otherwise, there is no history of other atopic diseases, including drug allergies, stinging insect allergies, or contact dermatitis. There is no significant infectious history. Vaccinations are up to date.    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   Thyromegaly 02/08/2021   Uterine fibroid 02/08/2021   Sciatica, right side 02/08/2021   Pure hypercholesterolemia 02/08/2021   Prediabetes 02/08/2021   Plantar fasciitis of left foot 02/08/2021   Palpitations 02/08/2021   Oral herpes simplex  infection 02/08/2021   Menopause 02/08/2021   Menometrorrhagia 02/08/2021   Low back pain 02/08/2021   History of psychiatric disorder 02/08/2021   Encounter for surveillance of other contraceptives 02/08/2021   Eczema 02/08/2021   Dysuria 02/08/2021   Dysmenorrhea 02/08/2021   Carpal tunnel syndrome of left wrist 02/08/2021   Anxiety 02/08/2021   Allergy to eggs 02/08/2021   Allergic rhinitis 02/08/2021   Allergic conjunctivitis 02/08/2021   Adjustment disorder with depressed mood 02/08/2021   Acute ethmoidal sinusitis 02/08/2021   Acquired absence of both cervix and uterus 02/08/2021   Abnormal vaginal bleeding 02/08/2021   S/P TAH (total abdominal hysterectomy) 03/10/2015   Vasovagal syncope 07/07/2014   Essential hypertension 07/07/2014    Medication List:  Allergies as of 07/30/2023       Reactions   Contrast Media [iodinated Contrast Media] Anaphylaxis   Shellfish Allergy Dermatitis, Itching, Shortness Of Breath   Amoxicillin-pot Clavulanate Hives   Has patient had a PCN reaction causing immediate rash, facial/tongue/throat swelling, SOB or lightheadedness with hypotension: No Has patient had a PCN reaction causing severe rash involving mucus membranes or skin necrosis: No Has patient had a PCN reaction that required hospitalization No Has patient had a PCN reaction occurring within the last 10 years: Yes If all of the above answers are "NO", then may proceed with Cephalosporin use. Other reaction(s): hives   Strawberry Extract Other (See Comments)  Other reaction(s): hives   Adhesive [tape] Other (See Comments)   Skin irritation   Codeine Other (See Comments)   Other reaction(s): Unknown   Egg-derived Products Hives, Rash   Other reaction(s): Unknown   Hydrochlorothiazide Other (See Comments)   Other reaction(s): Syncope   Hydrocodone Bitartrate Er Itching   Latex Itching, Rash   Nexium [esomeprazole] Other (See Comments)   headahce   Oxycodone-acetaminophen  Other (See Comments)   Other reaction(s): hallucinate   Peanut Oil Other (See Comments)   Other reaction(s): Unknown   Sudafed [pseudoephedrine Hcl] Palpitations        Medication List        Accurate as of Jul 30, 2023  1:10 PM. If you have any questions, ask your nurse or doctor.          amLODipine  5 MG tablet Commonly known as: NORVASC  Take 1 tablet by mouth every morning   azelastine  0.05 % ophthalmic solution Commonly known as: OPTIVAR  Place 1 drop into affected eye 2 (two) times daily as needed.   azelastine  0.1 % nasal spray Commonly known as: ASTELIN  Inhale 1-2 sprays into each nostril twice a day as needed.   celecoxib  200 MG capsule Commonly known as: CeleBREX  Take 1 capsule (200 mg total) by mouth daily with food, as needed for back pain.   cholecalciferol 25 MCG (1000 UNIT) tablet Commonly known as: VITAMIN D3 1 tablet Orally Once a day for 30 day(s)   cyclobenzaprine  10 MG tablet Commonly known as: FLEXERIL  Take 1/2 to 1 tablet by mouth at bedtime, as needed for muscle spasm.   diclofenac  Sodium 1 % Gel Commonly known as: VOLTAREN  Apply to affected area(s) as directed as needed.   EPINEPHrine  0.3 mg/0.3 mL Soaj injection Commonly known as: EPI-PEN Inject as needed for anaphylaxis.Aaron Aas must call 911 after   estradiol  0.1 MG/GM vaginal cream Commonly known as: ESTRACE  Place 1 g vaginally 3 (three) times a week.   estradiol  0.1 MG/GM vaginal cream Commonly known as: ESTRACE  Place 1 g vaginally 2 (two) times a week for 60 days as directed.   fluticasone  50 MCG/ACT nasal spray Commonly known as: FLONASE  Place 1 spray into both nostrils 2 (two) times daily as needed.   ipratropium 0.03 % nasal spray Commonly known as: ATROVENT  instill 2 sprays in each nostril twice a day   irbesartan  300 MG tablet Commonly known as: AVAPRO  Take 1 tablet (300 mg total) by mouth every morning.   loratadine  10 MG tablet Commonly known as: CLARITIN  Take 1  tablet (10 mg total) by mouth daily.   meclizine  12.5 MG tablet Commonly known as: ANTIVERT  Take 1 tablet (12.5 mg total) by mouth 3 (three) times daily as needed for dizziness.   meloxicam  15 MG tablet Commonly known as: MOBIC  Take 1 tablet (15 mg total) by mouth daily for 10 days What changed:  when to take this reasons to take this   methocarbamol  750 MG tablet Commonly known as: ROBAXIN  Take 1 tablet (750 mg total) by mouth every 8 (eight) hours for 10 days What changed:  when to take this reasons to take this   ofloxacin 0.3 % ophthalmic solution Commonly known as: OCUFLOX   Olopatadine HCl 0.2 % Soln INSTILL 1 DROP INTO EACH EYE ONCE A DAY, AS NEEDED   omeprazole  40 MG capsule Commonly known as: PRILOSEC Take 1 capsule (40 mg total) by mouth 30 minutes before morning meal   Omron 3 Series BP Monitor Devi Use as directed  rosuvastatin  20 MG tablet Commonly known as: CRESTOR  Take 1 tablet (20 mg total) by mouth daily.   traMADol  50 MG tablet Commonly known as: Ultram  Take 1 tablet (50 mg total) by mouth every 6 (six) hours as needed for severe pain (pain score 7-10) (severe post op pain; pt tolerates this medication).   triamcinolone  cream 0.1 % Commonly known as: KENALOG  Apply to affected area twice a day as needed for rash.   valACYclovir  1000 MG tablet Commonly known as: VALTREX  Take 2 tablets by mouth every 12 hours for 1 day, per outbreak of fever blister.        Birth History: non-contributory  Developmental History: non-contributory  Past Surgical History: Past Surgical History:  Procedure Laterality Date   ABDOMINAL HYSTERECTOMY N/A 03/10/2015   Procedure: HYSTERECTOMY ABDOMINAL;  Surgeon: Johnn Najjar, MD;  Location: [facility];  Service: Gynecology;  Laterality: N/A;   BILATERAL SALPINGECTOMY Bilateral 03/10/2015   pt denies   CHOLECYSTECTOMY     FOOT SURGERY     PERINEAL LACERATION REPAIR N/A 03/20/2023   Procedure: REPAIR VAGINAL  LACERATION;  Surgeon: Zora Hires, MD;  Location: [facility] OR;  Service: Gynecology;  Laterality: N/A;     Family History: Family History  Problem Relation Age of Onset   Urticaria Mother    Eczema Mother    Angioedema Mother    Allergic rhinitis Mother    Heart disease Mother    Heart failure Mother    Hyperlipidemia Mother    Heart attack Mother 35   Diabetes Father    Hyperlipidemia Father    Kidney failure Father        kidney transplant receipiant   Diabetes Brother    Diabetes Other    High blood pressure Other    Breast cancer Neg Hx    Colon cancer Neg Hx    Colon polyps Neg Hx    Esophageal cancer Neg Hx    Rectal cancer Neg Hx    Stomach cancer Neg Hx    Pancreatic cancer Neg Hx      Social History: Iria lives at home with her family. She lives in an apartment. There is carpeting throughout the apartment. There is electric heating and central cooling. There are no animals inside or outside of the home. There are dust mite coverings on the bedding. There is no tobacco exposure in the home. There is fume and chemical and dust exposure in the home. She does use a HEPA filter in her home.    Review of systems otherwise negative other than that mentioned in the HPI.    Objective:   Blood pressure (!) 140/82, pulse 68, temperature 98 F (36.7 C), temperature source Temporal, resp. rate 18, last menstrual period 01/10/2015, SpO2 95%. There is no height or weight on file to calculate BMI.     Physical Exam Vitals reviewed.  Constitutional:      Appearance: She is well-developed.  HENT:     Head: Normocephalic and atraumatic.     Right Ear: Tympanic membrane, ear canal and external ear normal. No drainage, swelling or tenderness. Tympanic membrane is not injected, scarred, erythematous, retracted or bulging.     Left Ear: Tympanic membrane, ear canal and external ear normal. No drainage, swelling or tenderness. Tympanic membrane is not injected, scarred,  erythematous, retracted or bulging.     Nose: No nasal deformity, septal deviation, mucosal edema or rhinorrhea.     Right Turbinates: Enlarged, swollen and pale.  Left Turbinates: Enlarged, swollen and pale.     Right Sinus: No maxillary sinus tenderness or frontal sinus tenderness.     Left Sinus: No maxillary sinus tenderness or frontal sinus tenderness.     Mouth/Throat:     Mouth: Mucous membranes are not pale and not dry.     Pharynx: Uvula midline.  Eyes:     General:        Right eye: No discharge.        Left eye: No discharge.     Conjunctiva/sclera: Conjunctivae normal.     Right eye: Right conjunctiva is not injected. No chemosis.    Left eye: Left conjunctiva is not injected. No chemosis.    Pupils: Pupils are equal, round, and reactive to light.  Cardiovascular:     Rate and Rhythm: Normal rate and regular rhythm.     Heart sounds: Normal heart sounds.  Pulmonary:     Effort: Pulmonary effort is normal. No tachypnea, accessory muscle usage or respiratory distress.     Breath sounds: Normal breath sounds. No wheezing, rhonchi or rales.     Comments: Moving air well in all lung fields. No increased work of breathing noted.  Chest:     Chest wall: No tenderness.  Abdominal:     Tenderness: There is no abdominal tenderness. There is no guarding or rebound.  Lymphadenopathy:     Head:     Right side of head: No submandibular, tonsillar or occipital adenopathy.     Left side of head: No submandibular, tonsillar or occipital adenopathy.     Cervical: No cervical adenopathy.  Skin:    Coloration: Skin is not pale.     Findings: No abrasion, erythema, petechiae or rash. Rash is not papular, urticarial or vesicular.  Neurological:     Mental Status: She is alert.  Psychiatric:        Behavior: Behavior is cooperative.      Diagnostic studies: deferred due to insurance stipulations that require a separate visit for testing          Drexel Gentles, MD [facility] 

## 2023-08-09 ENCOUNTER — Other Ambulatory Visit (HOSPITAL_COMMUNITY): Payer: Self-pay

## 2023-08-10 ENCOUNTER — Other Ambulatory Visit (HOSPITAL_COMMUNITY): Payer: Self-pay

## 2023-08-11 ENCOUNTER — Other Ambulatory Visit: Payer: Self-pay

## 2023-08-13 ENCOUNTER — Other Ambulatory Visit (HOSPITAL_COMMUNITY): Payer: Self-pay

## 2023-08-13 MED ORDER — VALACYCLOVIR HCL 1 G PO TABS
ORAL_TABLET | ORAL | 1 refills | Status: DC
  Filled 2023-08-13: qty 20, 10d supply, fill #0
  Filled 2023-08-20 – 2023-11-28 (×2): qty 20, 10d supply, fill #1
  Filled 2023-12-06: qty 20, 10d supply, fill #0

## 2023-08-14 ENCOUNTER — Other Ambulatory Visit (HOSPITAL_COMMUNITY): Payer: Self-pay

## 2023-08-14 DIAGNOSIS — E7800 Pure hypercholesterolemia, unspecified: Secondary | ICD-10-CM | POA: Diagnosis not present

## 2023-08-14 DIAGNOSIS — I10 Essential (primary) hypertension: Secondary | ICD-10-CM | POA: Diagnosis not present

## 2023-08-15 ENCOUNTER — Other Ambulatory Visit (HOSPITAL_COMMUNITY): Payer: Self-pay

## 2023-08-15 DIAGNOSIS — Z91018 Allergy to other foods: Secondary | ICD-10-CM | POA: Diagnosis not present

## 2023-08-15 DIAGNOSIS — K219 Gastro-esophageal reflux disease without esophagitis: Secondary | ICD-10-CM | POA: Diagnosis not present

## 2023-08-15 DIAGNOSIS — I10 Essential (primary) hypertension: Secondary | ICD-10-CM | POA: Diagnosis not present

## 2023-08-15 DIAGNOSIS — E7800 Pure hypercholesterolemia, unspecified: Secondary | ICD-10-CM | POA: Diagnosis not present

## 2023-08-15 MED ORDER — IRBESARTAN 300 MG PO TABS
300.0000 mg | ORAL_TABLET | Freq: Every morning | ORAL | 1 refills | Status: DC
  Filled 2023-08-15: qty 90, 90d supply, fill #0
  Filled 2023-11-20: qty 90, 90d supply, fill #1
  Filled 2023-11-22: qty 90, 90d supply, fill #0

## 2023-08-15 MED ORDER — ROSUVASTATIN CALCIUM 20 MG PO TABS
20.0000 mg | ORAL_TABLET | Freq: Every day | ORAL | 1 refills | Status: DC
  Filled 2023-08-15: qty 90, 90d supply, fill #0
  Filled 2023-12-03: qty 90, 90d supply, fill #1
  Filled 2023-12-06: qty 90, 90d supply, fill #0

## 2023-08-15 MED ORDER — OMEPRAZOLE 40 MG PO CPDR
40.0000 mg | DELAYED_RELEASE_CAPSULE | Freq: Every morning | ORAL | 1 refills | Status: AC
  Filled 2023-08-15 – 2023-10-10 (×4): qty 90, 90d supply, fill #0
  Filled 2024-01-08 – 2024-01-11 (×3): qty 90, 90d supply, fill #1

## 2023-08-16 ENCOUNTER — Other Ambulatory Visit (HOSPITAL_COMMUNITY): Payer: Self-pay

## 2023-08-16 ENCOUNTER — Ambulatory Visit: Admitting: Allergy

## 2023-08-17 ENCOUNTER — Other Ambulatory Visit (HOSPITAL_COMMUNITY): Payer: Self-pay

## 2023-08-20 ENCOUNTER — Other Ambulatory Visit (HOSPITAL_COMMUNITY): Payer: Self-pay

## 2023-08-22 ENCOUNTER — Ambulatory Visit: Admitting: Allergy

## 2023-08-24 ENCOUNTER — Other Ambulatory Visit (HOSPITAL_COMMUNITY): Payer: Self-pay

## 2023-08-29 ENCOUNTER — Ambulatory Visit: Admitting: Allergy

## 2023-09-05 ENCOUNTER — Ambulatory Visit (INDEPENDENT_AMBULATORY_CARE_PROVIDER_SITE_OTHER): Admitting: Allergy

## 2023-09-05 ENCOUNTER — Other Ambulatory Visit (HOSPITAL_COMMUNITY): Payer: Self-pay

## 2023-09-05 ENCOUNTER — Encounter: Payer: Self-pay | Admitting: Allergy

## 2023-09-05 DIAGNOSIS — L500 Allergic urticaria: Secondary | ICD-10-CM | POA: Diagnosis not present

## 2023-09-05 DIAGNOSIS — J3089 Other allergic rhinitis: Secondary | ICD-10-CM | POA: Diagnosis not present

## 2023-09-05 DIAGNOSIS — J310 Chronic rhinitis: Secondary | ICD-10-CM

## 2023-09-05 NOTE — Progress Notes (Signed)
 Follow-up Note  RE: Heather Rios MRN: [id number] DOB: 06-17-1965 Date of Office Visit: 09/05/2023   History of present illness: Heather Rios is a 58 y.o. female presenting today for skin testing visit.  She was last seen in the office on 07/10/23 for rhinoconjunctivitis.  She is in her usual state of health today without recent illness.  She has held antihistamines for at least 3 days for testing today.   Medication List: Current Outpatient Medications  Medication Sig Dispense Refill   azelastine  (ASTELIN ) 0.1 % nasal spray Inhale 1-2 sprays into each nostril twice a day as needed. 30 mL 12   azelastine  (OPTIVAR ) 0.05 % ophthalmic solution Place 1 drop into affected eye 2 (two) times daily as needed. 6 mL 3   Blood Pressure Monitoring (OMRON 3 SERIES BP MONITOR) DEVI Use as directed 1 each 0   celecoxib  (CELEBREX ) 200 MG capsule Take 1 capsule (200 mg total) by mouth daily with food, as needed for back pain. 30 capsule 2   cholecalciferol (VITAMIN D3) 25 MCG (1000 UNIT) tablet 1 tablet Orally Once a day for 30 day(s)     cyclobenzaprine  (FLEXERIL ) 10 MG tablet Take 1/2 to 1 tablet by mouth at bedtime, as needed for muscle spasm. 30 tablet 1   diclofenac  Sodium (VOLTAREN ) 1 % GEL Apply to affected area(s) as directed as needed. 100 g 1   EPINEPHrine  0.3 mg/0.3 mL IJ SOAJ injection Inject as needed for anaphylaxis.Aaron Aas must call 911 after 1 each 1   estradiol  (ESTRACE ) 0.1 MG/GM vaginal cream Place 1 g vaginally 2 (two) times a week for 60 days as directed. 42.5 g 3   fluticasone  (FLONASE ) 50 MCG/ACT nasal spray Place 1 spray into both nostrils 2 (two) times daily as needed. 48 g 0   ipratropium (ATROVENT ) 0.03 % nasal spray instill 2 sprays in each nostril twice a day 30 mL 0   irbesartan  (AVAPRO ) 300 MG tablet Take 1 tablet (300 mg total) by mouth every morning. 90 tablet 1   loratadine  (CLARITIN ) 10 MG tablet Take 1 tablet (10 mg total) by mouth daily. 30 tablet 0   meclizine   (ANTIVERT ) 12.5 MG tablet Take 1 tablet (12.5 mg total) by mouth 3 (three) times daily as needed for dizziness. 30 tablet 0   ofloxacin (OCUFLOX) 0.3 % ophthalmic solution   0   Olopatadine HCl 0.2 % SOLN INSTILL 1 DROP INTO EACH EYE ONCE A DAY, AS NEEDED  3   omeprazole  (PRILOSEC) 40 MG capsule Take 1 capsule (40 mg total) by mouth every morning 30 minutes before meal 90 capsule 1   rosuvastatin  (CRESTOR ) 20 MG tablet Take 1 tablet (20 mg total) by mouth daily. 90 tablet 0   rosuvastatin  (CRESTOR ) 20 MG tablet Take 1 tablet (20 mg total) by mouth daily. 90 tablet 1   triamcinolone  cream (KENALOG ) 0.1 % Apply to affected area twice a day as needed for rash. 80 g 3   valACYclovir  (VALTREX ) 1000 MG tablet Take 2 tablets by mouth every 12 hours for 1 day, per outbreak of fever blister. 20 tablet 1   valACYclovir  (VALTREX ) 1000 MG tablet Take 2 tablets by mouth every 12 hrs for one day, per outbreak of fever blister As needed 20 tablet 1   No current facility-administered medications for this visit.     Known medication allergies: Allergies  Allergen Reactions   Contrast Media [Iodinated Contrast Media] Anaphylaxis   Shellfish Allergy Dermatitis, Itching and Shortness Of  Breath   Amoxicillin-Pot Clavulanate Hives    Has patient had a PCN reaction causing immediate rash, facial/tongue/throat swelling, SOB or lightheadedness with hypotension: No Has patient had a PCN reaction causing severe rash involving mucus membranes or skin necrosis: No Has patient had a PCN reaction that required hospitalization No Has patient had a PCN reaction occurring within the last 10 years: Yes If all of the above answers are NO, then may proceed with Cephalosporin use.  Other reaction(s): hives   Strawberry Extract Other (See Comments)    Other reaction(s): hives   Adhesive [Tape] Other (See Comments)    Skin irritation   Codeine Other (See Comments)    Other reaction(s): Unknown   Egg-Derived Products Hives  and Rash    Other reaction(s): Unknown   Hydrochlorothiazide Other (See Comments)    Other reaction(s): Syncope   Hydrocodone Bitartrate Er Itching   Latex Itching and Rash   Nexium [Esomeprazole] Other (See Comments)    headahce   Oxycodone-Acetaminophen Other (See Comments)    Other reaction(s): hallucinate   Peanut Oil Other (See Comments)    Other reaction(s): Unknown   Sudafed [Pseudoephedrine Hcl] Palpitations   Diagnostics/Labs:  Allergy testing:   Airborne Adult Perc - 09/05/23 0929     Time Antigen Placed 1610    Allergen Manufacturer Floyd Hutchinson    Location Back    Number of Test 55    Panel 1 Select    1. Control-Buffer 50% Glycerol Negative    2. Control-Histamine 2+    3. Bahia Negative    4. French [place] Territ[place] Negative    5. Johnson 2+    6. [place]  Blue Negative    7. Meadow Fescue Negative    8. Perennial Rye Negative    9. Timothy Negative    10. Ragweed Mix Negative    11. Cocklebur Negative    12. Plantain,  English Negative    13. Baccharis Negative    14. Dog Fennel Negative    15. Russian Thistle Negative    16. Lamb's Quarters Negative    17. Sheep Sorrell Negative    18. Rough Pigweed Negative    19. Marsh Elder, Rough 2+    20. Mugwort, Common 2+    21. Box, Elder 3+    22. Cedar, red Negative    23. Sweet Gum Negative    24. Pecan Pollen Negative    25. Pine Mix Negative    26. Walnut, Black Pollen Negative    27. Red Mulberry Negative    28. Ash Mix 2+    29. Birch Mix Negative    30. Beech American 2+    31. Cottonwood, Guinea-Bissau Negative    32. Hickory, White Negative    33. Maple Mix Negative    34. Oak, [place] Mix Negative    35. Sycamore Eastern Negative    36. Alternaria Alternata Negative    37. Cladosporium Herbarum Negative    38. Aspergillus Mix Negative    39. Penicillium Mix 2+    40. Bipolaris Sorokiniana (Helminthosporium) Negative    41. Drechslera Spicifera (Curvularia) Negative    42. Mucor Plumbeus Negative    43. Fusarium  Moniliforme Negative    44. Aureobasidium Pullulans (pullulara) Negative    45. Rhizopus Oryzae 2+    46. Botrytis Cinera Negative    47. Epicoccum Nigrum Negative    48. Phoma Betae Negative    49. Dust Mite Mix 2+    50. Cat Hair 10,000 BAU/ml  Negative    51.  Dog Epithelia Negative    52. Mixed Feathers Negative    53. Horse Epithelia Negative    54. Cockroach, German Negative    55. Tobacco Leaf 2+          Food Adult Perc - 09/05/23 0900     Time Antigen Placed 1610    Allergen Manufacturer Floyd Hutchinson    Location Back    Number of allergen test 3    1. Peanut Negative    5. Milk, Cow 2+   4x10   7. Egg White, Chicken Negative    60. Strawberry Negative    65. Pineapple Negative          Allergy testing results were read and interpreted by provider, documented by clinical staff.   Assessment and plan: 1. Flexural atopic dermatitis - This seems to be under good control with the current regimen.   2. Chronic rhinitis - Testing today showed: grasses, weeds, trees, indoor molds, outdoor molds, dust mites, and tobacco - Copy of test results provided.  - Avoidance measures provided. - Consider allergy shots as a means of long-term control if medication management is net effective.  Will send your results to Dr Idolina Maker who will reach out to you with medication recommendations.  - Allergy shots re-train and reset the immune system to ignore environmental allergens and decrease the resulting immune response to those allergens (sneezing, itchy watery eyes, runny nose, nasal congestion, etc).    - Allergy shots improve symptoms in 75-85% of patients.   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  Negative to pineapple, strawberry, peanut and egg.  - Continue avoidance of milk/dairy in diet.  - Will send your results to Dr Idolina Maker for a plan regarding hives and food testing results.   4. Follow up appointment with Dr. Idolina Maker in 3-4 months  or sooner if needed  I appreciate the opportunity to take part in Addaleigh's care. Please do not hesitate to contact me with questions.  Sincerely,   Catha Clink, MD Allergy/Immunology [facility]

## 2023-09-05 NOTE — Patient Instructions (Signed)
 1. Flexural atopic dermatitis - This seems to be under good control with the current regimen.   2. Chronic rhinitis - Testing today showed: grasses, weeds, trees, indoor molds, outdoor molds, dust mites, and tobacco - Copy of test results provided.  - Avoidance measures provided. - Consider allergy shots as a means of long-term control if medication management is net effective.  Will send your results to Dr Idolina Maker who will reach out to you with medication recommendations.  - Allergy shots re-train and reset the immune system to ignore environmental allergens and decrease the resulting immune response to those allergens (sneezing, itchy watery eyes, runny nose, nasal congestion, etc).    - Allergy shots improve symptoms in 75-85% of patients.   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  Negative to pineapple, strawberry, peanut and egg.  - Continue avoidance of milk/dairy in diet.  - Will send your results to Dr Idolina Maker for a plan regarding hives and food testing results.   4. Follow up appointment with Dr. Idolina Maker in 3-4 months or sooner if needed

## 2023-09-10 ENCOUNTER — Other Ambulatory Visit (HOSPITAL_COMMUNITY): Payer: Self-pay

## 2023-09-10 DIAGNOSIS — J329 Chronic sinusitis, unspecified: Secondary | ICD-10-CM | POA: Diagnosis not present

## 2023-09-10 DIAGNOSIS — J309 Allergic rhinitis, unspecified: Secondary | ICD-10-CM | POA: Diagnosis not present

## 2023-09-10 MED ORDER — FLUCONAZOLE 150 MG PO TABS
150.0000 mg | ORAL_TABLET | ORAL | 0 refills | Status: DC
  Filled 2023-09-10: qty 2, 6d supply, fill #0

## 2023-09-10 MED ORDER — DOXYCYCLINE HYCLATE 100 MG PO CAPS
100.0000 mg | ORAL_CAPSULE | Freq: Two times a day (BID) | ORAL | 0 refills | Status: DC
  Filled 2023-09-10: qty 14, 7d supply, fill #0

## 2023-09-12 ENCOUNTER — Other Ambulatory Visit (HOSPITAL_COMMUNITY): Payer: Self-pay

## 2023-09-23 ENCOUNTER — Other Ambulatory Visit (HOSPITAL_COMMUNITY): Payer: Self-pay

## 2023-09-26 ENCOUNTER — Other Ambulatory Visit (HOSPITAL_COMMUNITY): Payer: Self-pay

## 2023-09-26 MED ORDER — TRIAMCINOLONE ACETONIDE 0.1 % EX CREA
TOPICAL_CREAM | CUTANEOUS | 3 refills | Status: DC
  Filled 2023-09-26: qty 80, 30d supply, fill #0
  Filled 2023-10-21 – 2023-11-23 (×5): qty 80, 30d supply, fill #1
  Filled 2023-11-28: qty 80, 30d supply, fill #0

## 2023-10-07 ENCOUNTER — Other Ambulatory Visit (HOSPITAL_COMMUNITY): Payer: Self-pay

## 2023-10-10 ENCOUNTER — Other Ambulatory Visit (HOSPITAL_COMMUNITY): Payer: Self-pay

## 2023-10-14 ENCOUNTER — Ambulatory Visit: Admitting: Physician Assistant

## 2023-10-17 ENCOUNTER — Other Ambulatory Visit (HOSPITAL_COMMUNITY): Payer: Self-pay

## 2023-10-18 ENCOUNTER — Other Ambulatory Visit (HOSPITAL_COMMUNITY): Payer: Self-pay

## 2023-10-18 DIAGNOSIS — R0981 Nasal congestion: Secondary | ICD-10-CM | POA: Diagnosis not present

## 2023-10-18 DIAGNOSIS — R040 Epistaxis: Secondary | ICD-10-CM | POA: Diagnosis not present

## 2023-10-18 MED ORDER — DOXYCYCLINE MONOHYDRATE 100 MG PO CAPS
100.0000 mg | ORAL_CAPSULE | Freq: Two times a day (BID) | ORAL | 0 refills | Status: DC
  Filled 2023-10-18: qty 10, 5d supply, fill #0

## 2023-10-19 ENCOUNTER — Other Ambulatory Visit (HOSPITAL_COMMUNITY): Payer: Self-pay

## 2023-10-21 ENCOUNTER — Other Ambulatory Visit (HOSPITAL_COMMUNITY): Payer: Self-pay

## 2023-10-21 ENCOUNTER — Other Ambulatory Visit: Payer: Self-pay

## 2023-10-23 ENCOUNTER — Other Ambulatory Visit (HOSPITAL_COMMUNITY): Payer: Self-pay

## 2023-11-02 ENCOUNTER — Other Ambulatory Visit (HOSPITAL_COMMUNITY): Payer: Self-pay

## 2023-11-12 ENCOUNTER — Other Ambulatory Visit (HOSPITAL_COMMUNITY): Payer: Self-pay

## 2023-11-14 ENCOUNTER — Other Ambulatory Visit (HOSPITAL_COMMUNITY): Payer: Self-pay

## 2023-11-19 ENCOUNTER — Other Ambulatory Visit (HOSPITAL_COMMUNITY): Payer: Self-pay

## 2023-11-22 ENCOUNTER — Other Ambulatory Visit: Payer: Self-pay

## 2023-11-22 ENCOUNTER — Other Ambulatory Visit (HOSPITAL_COMMUNITY): Payer: Self-pay

## 2023-11-25 ENCOUNTER — Other Ambulatory Visit (HOSPITAL_COMMUNITY): Payer: Self-pay

## 2023-11-28 ENCOUNTER — Other Ambulatory Visit (HOSPITAL_COMMUNITY): Payer: Self-pay

## 2023-11-28 ENCOUNTER — Other Ambulatory Visit: Payer: Self-pay

## 2023-11-29 ENCOUNTER — Other Ambulatory Visit (HOSPITAL_COMMUNITY): Payer: Self-pay

## 2023-12-02 ENCOUNTER — Encounter: Payer: Self-pay | Admitting: Physician Assistant

## 2023-12-02 ENCOUNTER — Other Ambulatory Visit: Payer: Self-pay

## 2023-12-02 ENCOUNTER — Ambulatory Visit: Attending: Physician Assistant | Admitting: Physician Assistant

## 2023-12-02 VITALS — BP 140/72 | HR 64 | Ht 61.0 in | Wt 154.0 lb

## 2023-12-02 DIAGNOSIS — I340 Nonrheumatic mitral (valve) insufficiency: Secondary | ICD-10-CM | POA: Diagnosis not present

## 2023-12-02 DIAGNOSIS — E7800 Pure hypercholesterolemia, unspecified: Secondary | ICD-10-CM

## 2023-12-02 DIAGNOSIS — Z8249 Family history of ischemic heart disease and other diseases of the circulatory system: Secondary | ICD-10-CM | POA: Diagnosis not present

## 2023-12-02 DIAGNOSIS — I10 Essential (primary) hypertension: Secondary | ICD-10-CM

## 2023-12-02 NOTE — Patient Instructions (Addendum)
 Thank you for choosing [facility]!     Medication Instructions:  No medication changes were made during today's visit.   Monitor your blood pressure daily.   Check blood pressure 1 hour after taking medication for 2 weeks. If blood pressure (top number) is 145 and above, call the office.  Blood pressure log provided.   *If you need a refill on your cardiac medications before your next appointment, please call your pharmacy*   Lab Work: No labs were ordered during today's visit.  If you have labs (blood work) drawn today and your tests are completely normal, you will receive your results only by: MyChart Message (if you have MyChart) OR A paper copy in the mail If you have any lab test that is abnormal or we need to change your treatment, we will call you to review the results.   Testing/Procedures: Your physician has requested that you have an echocardiogram. Echocardiography is a painless test that uses sound waves to create images of your heart. It provides your doctor with information about the size and shape of your heart and how well your heart's chambers and valves are working. This procedure takes approximately one hour. There are no restrictions for this procedure. Please do NOT wear cologne, perfume, aftershave, or lotions (deodorant is allowed). Please arrive 15 minutes prior to your appointment time.  Please note: We ask at that you not bring children with you during ultrasound (echo/ vascular) testing. Due to room size and safety concerns, children are not allowed in the ultrasound rooms during exams. Our front office staff cannot provide observation of children in our lobby area while testing is being conducted. An adult accompanying a patient to their appointment will only be allowed in the ultrasound room at the discretion of the ultrasound technician under special circumstances. We apologize for any inconvenience.   Your next appointment:   1  year(s)   Provider:   Oneil Parchment, MD or Orren Fabry    Follow-Up: At [facility], you and your health needs are our priority.  As part of our continuing mission to provide you with exceptional heart care, we have created designated Provider Care Teams.  These Care Teams include your primary Cardiologist (physician) and Advanced Practice Providers (APPs -  Physician Assistants and Nurse Practitioners) who all work together to provide you with the care you need, when you need it. We recommend signing up for the patient portal called MyChart.  Sign up information is provided on this After Visit Summary.  MyChart is used to connect with patients for Virtual Visits (Telemedicine).  Patients are able to view lab/test results, encounter notes, upcoming appointments, etc.  Non-urgent messages can be sent to your provider as well.   To learn more about what you can do with MyChart, go to [email or web address].

## 2023-12-02 NOTE — Progress Notes (Signed)
 Cardiology Office Note   Date:  12/02/2023  ID:  Heather Rios, DOB 10-18-1965, MRN [id number] PCP: Rolinda Millman, MD  [facility] Providers Cardiologist:  Oneil Parchment, MD  APP: Orren LOISE Fabry, PA-C  History of Present Illness Heather Rios is a 58 y.o. female with a past medical history of hypertension, MR, family history of CAD here for follow-up appointment.  She was seen February 2023.  Her last visit was July 2024.  She was doing well at that time without any chest pain, shortness of breath, or syncope.  She does sleep on an incline secondary to acid reflux.  She has some pedal edema on the left.  She has an old injury to her left ankle.  She does not smoke.  At her last visit a calcium  score was discussed.  Since it would not change her management because she is already taking a statin she preferred to hold off at this time.  Today, she has a PMH significant for mitral regurgitation, hyperlipidemia, and FH of CAD who presents for cardiovascular follow-up.  She is currently asymptomatic and feels well.  Her last echocardiogram in 22-Dec-2019 showed a mild mitral valve leak. She does have a soft systolic murmur  She takes Crestorol 20 mg in the morning and tolerates it well without side effects. Recent lab work from May showed an LDL of 72 mg/dL, triglycerides of 91 mg/dL, and a hemoglobin J8r of 6.0%, all within normal limits.  She monitors her blood pressure at home.  She is conscious of her sodium intake, keeping it below 2000 mg per day, and uses Mrs. Dash instead of salt. She prefers healthier food options like grilled over fried foods.  Her mother was treated for coronary artery disease and passed away in 12/21/2016.   Reports no shortness of breath nor dyspnea on exertion. Reports no chest pain, pressure, or tightness. No edema, orthopnea, PND. Reports no palpitations.   Discussed the use of AI scribe software for clinical note transcription with the patient, who gave verbal  consent to proceed.  ROS: pertinent ROS in HPI  Studies Reviewed EKG Interpretation Date/Time:  Monday December 02 2023 12:56:38 EDT Ventricular Rate:  62 PR Interval:  154 QRS Duration:  88 QT Interval:  424 QTC Calculation: 430 R Axis:   79  Text Interpretation: Normal sinus rhythm Nonspecific T wave abnormality When compared with ECG of 21-Sep-2022 09:22, QT has lengthened Confirmed by Fabry Orren [phone]) on 12/02/2023 1:05:48 PM    Echo 04/09/19  IMPRESSIONS     1. Left ventricular ejection fraction, by visual estimation, is 65 to  70%. The left ventricle has normal function. There is mildly increased  left ventricular hypertrophy.   2. The left ventricle has no regional wall motion abnormalities.   3. Global longitudinal strain is -20.6%.   4. Global right ventricle has normal systolic function.The right  ventricular size is normal. No increase in right ventricular wall  thickness.   5. Left atrial size was normal.   6. Right atrial size was normal.   7. Trivial pericardial effusion is present.   8. The mitral valve is normal in structure. Mild mitral valve  regurgitation.   9. The tricuspid valve is normal in structure.  10. The tricuspid valve is normal in structure. Tricuspid valve  regurgitation is trivial.  11. The aortic valve is tricuspid. Aortic valve regurgitation is trivial.  Mild aortic valve sclerosis without stenosis.  12. The pulmonic valve was normal in  structure. Pulmonic valve  regurgitation is trivial.  13. Normal pulmonary artery systolic pressure.  14. The inferior vena cava is normal in size with greater than 50%  respiratory variability, suggesting right atrial pressure of 3 mmHg.   FINDINGS   Left Ventricle: Left ventricular ejection fraction, by visual estimation,  is 65 to 70%. The left ventricle has normal function. The left ventricle  has no regional wall motion abnormalities. There is mildly increased left  ventricular hypertrophy.  Left  ventricular diastolic parameters were normal. Global longitudinal strain  is -20.6%.   Right Ventricle: The right ventricular size is normal. No increase in  right ventricular wall thickness. Global RV systolic function is has  normal systolic function. The tricuspid regurgitant velocity is 2.06 m/s,  and with an assumed right atrial pressure   of 3 mmHg, the estimated right ventricular systolic pressure is normal at  20.0 mmHg.   Left Atrium: Left atrial size was normal in size.   Right Atrium: Right atrial size was normal in size   Pericardium: Trivial pericardial effusion is present.   Mitral Valve: The mitral valve is normal in structure. Mild mitral valve  regurgitation.   Tricuspid Valve: The tricuspid valve is normal in structure. Tricuspid  valve regurgitation is trivial.   Aortic Valve: The aortic valve is tricuspid. Aortic valve regurgitation is  trivial. Aortic regurgitation PHT measures 766 msec. Mild aortic valve  sclerosis is present, with no evidence of aortic valve stenosis.   Pulmonic Valve: The pulmonic valve was normal in structure. Pulmonic valve  regurgitation is trivial. Pulmonic regurgitation is trivial.   Aorta: The aortic root and ascending aorta are structurally normal, with  no evidence of dilitation.   Venous: The inferior vena cava is normal in size with greater than 50%  respiratory variability, suggesting right atrial pressure of 3 mmHg.   IAS/Shunts: No atrial level shunt detected by color flow Doppler.   Physical Exam VS:  BP (!) 140/72   Pulse 64   Ht 5' 1 (1.549 m)   Wt 154 lb (69.9 kg)   LMP 01/10/2015 (Exact Date)   SpO2 99%   BMI 29.10 kg/m        Wt Readings from Last 3 Encounters:  12/02/23 154 lb (69.9 kg)  05/14/23 150 lb 9.6 oz (68.3 kg)  04/29/23 154 lb (69.9 kg)    GEN: Well nourished, well developed in no acute distress NECK: No JVD; No carotid bruits CARDIAC: RRR, no murmurs, rubs, gallops RESPIRATORY:   Clear to auscultation without rales, wheezing or rhonchi  ABDOMEN: Soft, non-tender, non-distended EXTREMITIES:  No edema; No deformity   ASSESSMENT AND PLAN  Nonrheumatic mitral valve regurgitation Mild mitral valve regurgitation on echocardiogram. No acute symptoms. Monitoring required due to heart murmur and family history. - Order echocardiogram to assess mitral valve and cardiac function.  Essential hypertension Blood pressure slightly elevated, possibly due to white coat syndrome. Advised home monitoring and lifestyle modifications. - Monitor blood pressure at home, especially if consistently above 145 mmHg. - Advise on lifestyle modifications, including sodium intake monitoring and healthy eating habits. - Use Omron blood pressure cuff for home monitoring. - Contact via MyChart if blood pressure remains elevated for medication adjustment.  Pure hypercholesterolemia LDL cholesterol at 72 mg/dL and triglycerides at 91 mg/dL, within target range. On Crestor  20 mg, taken in the morning due to work schedule. - Continue Crestor  20 mg daily, taken in the morning. - Discussed option to take Crestor  at night for  better absorption, but prioritize adherence to medication schedule.  Family history of ischemic heart disease -preventative maintance discussed -encouraged low sodium diet, physical exercise, and continued follow-up      Dispo: She can return in a year to see me or Dr. Jeffrie  Signed, Orren LOISE Fabry, PA-C

## 2023-12-03 ENCOUNTER — Other Ambulatory Visit (HOSPITAL_COMMUNITY): Payer: Self-pay

## 2023-12-06 ENCOUNTER — Other Ambulatory Visit: Payer: Self-pay

## 2023-12-09 ENCOUNTER — Other Ambulatory Visit (HOSPITAL_COMMUNITY): Payer: Self-pay

## 2023-12-10 ENCOUNTER — Other Ambulatory Visit: Payer: Self-pay

## 2023-12-13 DIAGNOSIS — R1032 Left lower quadrant pain: Secondary | ICD-10-CM | POA: Diagnosis not present

## 2023-12-13 DIAGNOSIS — R350 Frequency of micturition: Secondary | ICD-10-CM | POA: Diagnosis not present

## 2023-12-23 ENCOUNTER — Other Ambulatory Visit: Payer: Self-pay

## 2024-01-06 ENCOUNTER — Other Ambulatory Visit: Payer: Self-pay

## 2024-01-08 ENCOUNTER — Other Ambulatory Visit (HOSPITAL_COMMUNITY): Payer: Self-pay

## 2024-01-09 ENCOUNTER — Other Ambulatory Visit (HOSPITAL_COMMUNITY)

## 2024-01-10 ENCOUNTER — Ambulatory Visit: Admitting: Allergy

## 2024-01-11 ENCOUNTER — Other Ambulatory Visit: Payer: Self-pay

## 2024-01-11 ENCOUNTER — Other Ambulatory Visit (HOSPITAL_COMMUNITY): Payer: Self-pay

## 2024-01-13 ENCOUNTER — Other Ambulatory Visit: Payer: Self-pay

## 2024-01-13 DIAGNOSIS — Z1331 Encounter for screening for depression: Secondary | ICD-10-CM | POA: Diagnosis not present

## 2024-01-13 DIAGNOSIS — Z1231 Encounter for screening mammogram for malignant neoplasm of breast: Secondary | ICD-10-CM | POA: Diagnosis not present

## 2024-01-13 DIAGNOSIS — Z01419 Encounter for gynecological examination (general) (routine) without abnormal findings: Secondary | ICD-10-CM | POA: Diagnosis not present

## 2024-01-13 MED ORDER — NYSTATIN-TRIAMCINOLONE 100000-0.1 UNIT/GM-% EX OINT
TOPICAL_OINTMENT | Freq: Two times a day (BID) | CUTANEOUS | 0 refills | Status: AC
Start: 2024-01-13 — End: ?
  Filled 2024-01-13 – 2024-01-18 (×2): qty 15, 10d supply, fill #0
  Filled 2024-01-29: qty 15, 30d supply, fill #0
  Filled 2024-02-06: qty 15, 15d supply, fill #0

## 2024-01-13 MED ORDER — ESTRADIOL 0.01 % VA CREA
1.0000 g | TOPICAL_CREAM | VAGINAL | 3 refills | Status: AC
Start: 2024-01-13 — End: ?
  Filled 2024-01-13: qty 42.5, 90d supply, fill #0
  Filled 2024-01-14 – 2024-01-17 (×4): qty 42.5, 30d supply, fill #0
  Filled 2024-01-17 – 2024-01-26 (×2): qty 42.5, 90d supply, fill #0

## 2024-01-14 ENCOUNTER — Other Ambulatory Visit: Payer: Self-pay

## 2024-01-15 ENCOUNTER — Other Ambulatory Visit: Payer: Self-pay

## 2024-01-16 ENCOUNTER — Other Ambulatory Visit: Payer: Self-pay

## 2024-01-17 ENCOUNTER — Other Ambulatory Visit: Payer: Self-pay

## 2024-01-18 ENCOUNTER — Other Ambulatory Visit: Payer: Self-pay

## 2024-01-23 ENCOUNTER — Ambulatory Visit (HOSPITAL_COMMUNITY)

## 2024-01-26 ENCOUNTER — Other Ambulatory Visit: Payer: Self-pay

## 2024-01-27 ENCOUNTER — Ambulatory Visit (HOSPITAL_COMMUNITY)
Admission: RE | Admit: 2024-01-27 | Discharge: 2024-01-27 | Disposition: A | Discharge status: Home / Self Care | Source: Ambulatory Visit | Attending: Cardiovascular Disease | Admitting: Cardiovascular Disease

## 2024-01-27 DIAGNOSIS — I340 Nonrheumatic mitral (valve) insufficiency: Secondary | ICD-10-CM | POA: Diagnosis not present

## 2024-01-27 LAB — ECHOCARDIOGRAM COMPLETE: S' Lateral: 2.19 cm

## 2024-01-28 ENCOUNTER — Other Ambulatory Visit: Payer: Self-pay

## 2024-01-29 ENCOUNTER — Other Ambulatory Visit: Payer: Self-pay

## 2024-01-29 ENCOUNTER — Ambulatory Visit: Payer: Self-pay | Admitting: Physician Assistant

## 2024-01-30 ENCOUNTER — Other Ambulatory Visit: Payer: Self-pay

## 2024-02-05 ENCOUNTER — Other Ambulatory Visit: Payer: Self-pay

## 2024-02-06 ENCOUNTER — Other Ambulatory Visit: Payer: Self-pay

## 2024-02-06 DIAGNOSIS — N898 Other specified noninflammatory disorders of vagina: Secondary | ICD-10-CM | POA: Diagnosis not present

## 2024-02-06 DIAGNOSIS — R350 Frequency of micturition: Secondary | ICD-10-CM | POA: Diagnosis not present

## 2024-02-06 MED ORDER — IRBESARTAN 300 MG PO TABS
300.0000 mg | ORAL_TABLET | Freq: Every morning | ORAL | 0 refills | Status: AC
  Filled 2024-02-06: qty 90, 90d supply, fill #0

## 2024-02-07 ENCOUNTER — Other Ambulatory Visit: Payer: Self-pay

## 2024-02-10 ENCOUNTER — Other Ambulatory Visit (HOSPITAL_COMMUNITY): Payer: Self-pay

## 2024-02-10 MED ORDER — ESTRADIOL 0.01 % VA CREA
TOPICAL_CREAM | VAGINAL | 1 refills | Status: AC
  Filled 2024-02-10: qty 42.5, 90d supply, fill #0
  Filled 2024-02-11: qty 42.5, 30d supply, fill #0

## 2024-02-11 ENCOUNTER — Other Ambulatory Visit: Payer: Self-pay

## 2024-02-11 ENCOUNTER — Encounter: Payer: Self-pay | Admitting: Pharmacist

## 2024-02-11 ENCOUNTER — Other Ambulatory Visit (HOSPITAL_COMMUNITY): Payer: Self-pay

## 2024-02-20 ENCOUNTER — Other Ambulatory Visit (HOSPITAL_COMMUNITY): Payer: Self-pay

## 2024-02-20 ENCOUNTER — Other Ambulatory Visit: Payer: Self-pay

## 2024-02-20 DIAGNOSIS — K219 Gastro-esophageal reflux disease without esophagitis: Secondary | ICD-10-CM | POA: Diagnosis not present

## 2024-02-20 DIAGNOSIS — E7800 Pure hypercholesterolemia, unspecified: Secondary | ICD-10-CM | POA: Diagnosis not present

## 2024-02-20 DIAGNOSIS — Z0000 Encounter for general adult medical examination without abnormal findings: Secondary | ICD-10-CM | POA: Diagnosis not present

## 2024-02-20 DIAGNOSIS — I340 Nonrheumatic mitral (valve) insufficiency: Secondary | ICD-10-CM | POA: Diagnosis not present

## 2024-02-20 DIAGNOSIS — Z7185 Encounter for immunization safety counseling: Secondary | ICD-10-CM | POA: Diagnosis not present

## 2024-02-20 DIAGNOSIS — I10 Essential (primary) hypertension: Secondary | ICD-10-CM | POA: Diagnosis not present

## 2024-02-20 MED ORDER — DEXLANSOPRAZOLE 60 MG PO CPDR
60.0000 mg | DELAYED_RELEASE_CAPSULE | Freq: Every day | ORAL | 1 refills | Status: AC
  Filled 2024-02-20 – 2024-02-21 (×3): qty 90, 90d supply, fill #0

## 2024-02-20 MED ORDER — AMLODIPINE BESYLATE 2.5 MG PO TABS
2.5000 mg | ORAL_TABLET | Freq: Every day | ORAL | 1 refills | Status: DC
  Filled 2024-02-20: qty 90, 90d supply, fill #0

## 2024-02-21 ENCOUNTER — Other Ambulatory Visit: Payer: Self-pay

## 2024-02-21 ENCOUNTER — Other Ambulatory Visit (HOSPITAL_COMMUNITY): Payer: Self-pay

## 2024-02-21 DIAGNOSIS — J019 Acute sinusitis, unspecified: Secondary | ICD-10-CM | POA: Diagnosis not present

## 2024-02-21 DIAGNOSIS — B9689 Other specified bacterial agents as the cause of diseases classified elsewhere: Secondary | ICD-10-CM | POA: Diagnosis not present

## 2024-02-21 MED ORDER — PREDNISONE 20 MG PO TABS
20.0000 mg | ORAL_TABLET | Freq: Two times a day (BID) | ORAL | 0 refills | Status: AC
  Filled 2024-02-21: qty 10, 5d supply, fill #0

## 2024-02-21 MED ORDER — DOXYCYCLINE HYCLATE 100 MG PO CAPS
100.0000 mg | ORAL_CAPSULE | Freq: Two times a day (BID) | ORAL | 0 refills | Status: AC
  Filled 2024-02-21: qty 14, 7d supply, fill #0

## 2024-02-24 ENCOUNTER — Other Ambulatory Visit: Payer: Self-pay

## 2024-02-24 MED ORDER — OMRON 3 SERIES BP MONITOR DEVI
0 refills | Status: DC
  Filled 2024-02-24: qty 1, 30d supply, fill #0

## 2024-02-25 ENCOUNTER — Other Ambulatory Visit: Payer: Self-pay

## 2024-02-26 ENCOUNTER — Other Ambulatory Visit: Payer: Self-pay

## 2024-02-26 ENCOUNTER — Telehealth: Payer: Self-pay | Admitting: Physician Assistant

## 2024-02-26 MED ORDER — CELECOXIB 200 MG PO CAPS
200.0000 mg | ORAL_CAPSULE | Freq: Every day | ORAL | 2 refills | Status: AC | PRN
  Filled 2024-02-26 – 2024-04-03 (×2): qty 30, 30d supply, fill #0

## 2024-02-26 NOTE — Telephone Encounter (Signed)
 Patient reports her BP has been elevated over the past 1.5 weeks. She saw her PCP on 12/4 and was prescribed amlodipine  2.5 mg daily, which she was not able to start until 12/8.  BP today at 8AM: 165/90, then she took her amlodipine  and irbesartan , rechecked at 9:30 AM came down to 148/80.  BP readings over the last 7 days: 151/73 150/90 160/80  Patient states she has a sinus infection that is being treated with doxycycline . She has had dizziness from this, for which she was prescribed meclizine  and states this does help.  Denies chest pain, SOB, or headache or blurred vision.  Patient states she was instructed to update Tessa if BP is elevated, may need to change irbesartan  as she has been on this medication for years.  Will forward to Orren Fabry, PA-C to review and advise.

## 2024-02-26 NOTE — Telephone Encounter (Signed)
 Pt c/o BP issue: STAT if pt c/o blurred vision, one-sided weakness or slurred speech.  STAT if BP is GREATER than 180/120 TODAY.  STAT if BP is LESS than 90/60 and SYMPTOMATIC TODAY  1. What is your BP concern? Elevated  2. Have you taken any BP medication today? yes  3. What are your last 5 BP readings?151/73, 150/90, 160/80  4. Are you having any other symptoms (ex. Dizziness, headache, blurred vision, passed out)? dizziness

## 2024-02-27 MED ORDER — AMLODIPINE BESYLATE 2.5 MG PO TABS: 5.0000 mg | ORAL_TABLET | Freq: Every day | ORAL | Status: AC

## 2024-02-27 NOTE — Telephone Encounter (Signed)
 Spoke with patient and shared recommendation from Tessa:  Lots increase her Amlodipine  to 5mg  and have her continue to track BP. Thanks!   Orren LOISE Fabry, PA-C  Patient reports her BP this morning before meds is 141/82, HR 62. She will double up on her current tablets of amlodipine  and will monitor BP for 1-2 weeks then send update to us . Patient verbalized understanding of recommendation from [facility], and expressed appreciation for follow-up.

## 2024-03-02 ENCOUNTER — Other Ambulatory Visit (HOSPITAL_COMMUNITY): Payer: Self-pay

## 2024-03-06 ENCOUNTER — Other Ambulatory Visit (HOSPITAL_COMMUNITY): Payer: Self-pay

## 2024-03-06 MED ORDER — ROSUVASTATIN CALCIUM 20 MG PO TABS
20.0000 mg | ORAL_TABLET | Freq: Every day | ORAL | 0 refills | Status: AC
  Filled 2024-03-06: qty 90, 90d supply, fill #0

## 2024-03-09 ENCOUNTER — Other Ambulatory Visit (HOSPITAL_COMMUNITY): Payer: Self-pay

## 2024-03-10 ENCOUNTER — Other Ambulatory Visit: Payer: Self-pay

## 2024-03-22 ENCOUNTER — Other Ambulatory Visit (HOSPITAL_COMMUNITY): Payer: Self-pay

## 2024-03-24 ENCOUNTER — Other Ambulatory Visit: Payer: Self-pay

## 2024-03-24 ENCOUNTER — Other Ambulatory Visit (HOSPITAL_COMMUNITY): Payer: Self-pay

## 2024-03-24 MED ORDER — AMLODIPINE BESYLATE 5 MG PO TABS
5.0000 mg | ORAL_TABLET | Freq: Every day | ORAL | 1 refills | Status: AC
  Filled 2024-03-24: qty 90, 90d supply, fill #0

## 2024-04-03 ENCOUNTER — Other Ambulatory Visit: Payer: Self-pay

## 2024-04-09 ENCOUNTER — Other Ambulatory Visit (HOSPITAL_COMMUNITY): Payer: Self-pay

## 2024-04-09 ENCOUNTER — Other Ambulatory Visit: Payer: Self-pay

## 2024-04-09 MED ORDER — VALACYCLOVIR HCL 1 G PO TABS
2000.0000 mg | ORAL_TABLET | Freq: Two times a day (BID) | ORAL | 1 refills | Status: AC
  Filled 2024-04-09: qty 20, 5d supply, fill #0

## 2024-04-16 ENCOUNTER — Ambulatory Visit: Admitting: Allergy

## 2024-04-23 ENCOUNTER — Ambulatory Visit: Admitting: Gastroenterology

## 2024-05-28 ENCOUNTER — Ambulatory Visit: Admitting: Gastroenterology
